# Patient Record
Sex: Female | Born: 1935 | Race: White | Hispanic: No | Smoking: Never smoker
Health system: Southern US, Community
[De-identification: ages and names within clinical notes are randomized; demographics above are authoritative.]

## PROBLEM LIST (undated history)

## (undated) DIAGNOSIS — I208 Other forms of angina pectoris: Secondary | ICD-10-CM

## (undated) DIAGNOSIS — R911 Solitary pulmonary nodule: Secondary | ICD-10-CM

## (undated) DIAGNOSIS — D696 Thrombocytopenia, unspecified: Secondary | ICD-10-CM

## (undated) DIAGNOSIS — D649 Anemia, unspecified: Secondary | ICD-10-CM

## (undated) DIAGNOSIS — F419 Anxiety disorder, unspecified: Secondary | ICD-10-CM

## (undated) DIAGNOSIS — K219 Gastro-esophageal reflux disease without esophagitis: Secondary | ICD-10-CM

## (undated) DIAGNOSIS — I2089 Other forms of angina pectoris: Secondary | ICD-10-CM

## (undated) DIAGNOSIS — I454 Nonspecific intraventricular block: Secondary | ICD-10-CM

## (undated) DIAGNOSIS — IMO0001 Reserved for inherently not codable concepts without codable children: Secondary | ICD-10-CM

## (undated) DIAGNOSIS — I2510 Atherosclerotic heart disease of native coronary artery without angina pectoris: Secondary | ICD-10-CM

## (undated) HISTORY — DX: Anxiety disorder, unspecified: F41.9

## (undated) HISTORY — DX: Other forms of angina pectoris: I20.8

## (undated) HISTORY — DX: Other forms of angina pectoris: I20.89

## (undated) HISTORY — DX: Anemia, unspecified: D64.9

## (undated) HISTORY — DX: Reserved for inherently not codable concepts without codable children: IMO0001

## (undated) HISTORY — DX: Thrombocytopenia, unspecified: D69.6

## (undated) HISTORY — DX: Gastro-esophageal reflux disease without esophagitis: K21.9

## (undated) HISTORY — DX: Nonspecific intraventricular block: I45.4

## (undated) HISTORY — DX: Solitary pulmonary nodule: R91.1

## (undated) HISTORY — DX: Atherosclerotic heart disease of native coronary artery without angina pectoris: I25.10

---

## 2001-05-26 ENCOUNTER — Encounter: Payer: Self-pay | Admitting: Emergency Medicine

## 2001-05-26 ENCOUNTER — Emergency Department (HOSPITAL_COMMUNITY): Admission: EM | Admit: 2001-05-26 | Discharge: 2001-05-26 | Payer: Self-pay | Admitting: Emergency Medicine

## 2001-07-24 ENCOUNTER — Encounter: Payer: Self-pay | Admitting: Internal Medicine

## 2001-07-24 ENCOUNTER — Encounter: Admission: RE | Admit: 2001-07-24 | Discharge: 2001-07-24 | Payer: Self-pay | Admitting: Internal Medicine

## 2002-07-11 ENCOUNTER — Inpatient Hospital Stay (HOSPITAL_COMMUNITY): Admission: EM | Admit: 2002-07-11 | Discharge: 2002-07-12 | Payer: Self-pay | Admitting: Cardiology

## 2004-10-27 ENCOUNTER — Ambulatory Visit: Payer: Self-pay | Admitting: Cardiology

## 2004-10-27 ENCOUNTER — Ambulatory Visit: Payer: Self-pay | Admitting: Cardiovascular Disease

## 2004-10-27 ENCOUNTER — Inpatient Hospital Stay (HOSPITAL_COMMUNITY): Admission: AD | Admit: 2004-10-27 | Discharge: 2004-10-30 | Payer: Self-pay | Admitting: Cardiovascular Disease

## 2004-11-04 ENCOUNTER — Ambulatory Visit: Payer: Self-pay | Admitting: Cardiology

## 2004-12-22 ENCOUNTER — Ambulatory Visit: Payer: Self-pay | Admitting: Cardiology

## 2005-12-07 ENCOUNTER — Ambulatory Visit: Payer: Self-pay | Admitting: Cardiology

## 2005-12-07 ENCOUNTER — Encounter: Payer: Self-pay | Admitting: Cardiology

## 2006-01-03 ENCOUNTER — Ambulatory Visit: Payer: Self-pay | Admitting: Cardiology

## 2006-02-14 ENCOUNTER — Ambulatory Visit: Payer: Self-pay | Admitting: Cardiology

## 2006-02-25 ENCOUNTER — Ambulatory Visit: Payer: Self-pay | Admitting: Cardiology

## 2006-03-03 ENCOUNTER — Inpatient Hospital Stay (HOSPITAL_BASED_OUTPATIENT_CLINIC_OR_DEPARTMENT_OTHER): Admission: RE | Admit: 2006-03-03 | Discharge: 2006-03-03 | Payer: Self-pay | Admitting: Cardiology

## 2006-03-03 ENCOUNTER — Ambulatory Visit: Payer: Self-pay | Admitting: Cardiology

## 2006-08-24 ENCOUNTER — Ambulatory Visit: Payer: Self-pay | Admitting: Cardiology

## 2007-01-27 ENCOUNTER — Ambulatory Visit: Payer: Self-pay | Admitting: Cardiology

## 2007-02-03 ENCOUNTER — Encounter: Admission: RE | Admit: 2007-02-03 | Discharge: 2007-02-03 | Payer: Self-pay | Admitting: General Surgery

## 2007-04-28 ENCOUNTER — Encounter: Payer: Self-pay | Admitting: Cardiology

## 2007-10-26 ENCOUNTER — Ambulatory Visit: Payer: Self-pay | Admitting: Cardiology

## 2008-02-04 ENCOUNTER — Encounter: Payer: Self-pay | Admitting: Cardiology

## 2008-05-31 ENCOUNTER — Ambulatory Visit: Payer: Self-pay | Admitting: Cardiology

## 2008-06-03 ENCOUNTER — Ambulatory Visit: Payer: Self-pay | Admitting: Cardiology

## 2008-08-08 ENCOUNTER — Ambulatory Visit: Payer: Self-pay | Admitting: Cardiology

## 2008-08-10 ENCOUNTER — Encounter: Payer: Self-pay | Admitting: Cardiology

## 2008-08-15 ENCOUNTER — Inpatient Hospital Stay (HOSPITAL_BASED_OUTPATIENT_CLINIC_OR_DEPARTMENT_OTHER): Admission: RE | Admit: 2008-08-15 | Discharge: 2008-08-15 | Payer: Self-pay | Admitting: Cardiology

## 2008-08-15 ENCOUNTER — Ambulatory Visit: Payer: Self-pay | Admitting: Cardiology

## 2008-10-18 ENCOUNTER — Encounter: Payer: Self-pay | Admitting: Cardiology

## 2008-10-29 ENCOUNTER — Inpatient Hospital Stay (HOSPITAL_COMMUNITY): Admission: RE | Admit: 2008-10-29 | Discharge: 2008-11-10 | Payer: Self-pay | Admitting: Cardiology

## 2008-10-29 ENCOUNTER — Ambulatory Visit: Payer: Self-pay | Admitting: Thoracic Surgery (Cardiothoracic Vascular Surgery)

## 2008-10-29 ENCOUNTER — Ambulatory Visit: Payer: Self-pay | Admitting: Cardiology

## 2008-10-29 ENCOUNTER — Encounter: Payer: Self-pay | Admitting: Cardiology

## 2008-10-29 DIAGNOSIS — J984 Other disorders of lung: Secondary | ICD-10-CM | POA: Insufficient documentation

## 2008-10-29 DIAGNOSIS — I2510 Atherosclerotic heart disease of native coronary artery without angina pectoris: Secondary | ICD-10-CM | POA: Insufficient documentation

## 2008-10-29 DIAGNOSIS — R079 Chest pain, unspecified: Secondary | ICD-10-CM | POA: Insufficient documentation

## 2008-10-29 DIAGNOSIS — D696 Thrombocytopenia, unspecified: Secondary | ICD-10-CM

## 2008-10-29 DIAGNOSIS — I447 Left bundle-branch block, unspecified: Secondary | ICD-10-CM

## 2008-10-29 DIAGNOSIS — D649 Anemia, unspecified: Secondary | ICD-10-CM

## 2008-10-30 ENCOUNTER — Encounter: Payer: Self-pay | Admitting: Cardiothoracic Surgery

## 2008-11-04 HISTORY — PX: CORONARY ARTERY BYPASS GRAFT: SHX141

## 2008-12-02 ENCOUNTER — Ambulatory Visit: Payer: Self-pay | Admitting: Cardiothoracic Surgery

## 2008-12-02 ENCOUNTER — Encounter: Admission: RE | Admit: 2008-12-02 | Discharge: 2008-12-02 | Payer: Self-pay | Admitting: Cardiothoracic Surgery

## 2008-12-24 ENCOUNTER — Ambulatory Visit: Payer: Self-pay | Admitting: Cardiology

## 2009-07-10 ENCOUNTER — Ambulatory Visit: Payer: Self-pay | Admitting: Cardiology

## 2009-07-14 ENCOUNTER — Encounter: Payer: Self-pay | Admitting: Cardiology

## 2009-07-15 ENCOUNTER — Encounter: Payer: Self-pay | Admitting: Cardiology

## 2009-07-25 ENCOUNTER — Ambulatory Visit: Payer: Self-pay | Admitting: Cardiology

## 2009-07-25 ENCOUNTER — Encounter: Payer: Self-pay | Admitting: Cardiology

## 2009-10-25 ENCOUNTER — Encounter: Payer: Self-pay | Admitting: Cardiology

## 2009-10-27 ENCOUNTER — Encounter: Payer: Self-pay | Admitting: Cardiology

## 2009-10-29 ENCOUNTER — Ambulatory Visit: Payer: Self-pay | Admitting: Cardiology

## 2009-10-29 DIAGNOSIS — R609 Edema, unspecified: Secondary | ICD-10-CM

## 2009-10-29 DIAGNOSIS — I10 Essential (primary) hypertension: Secondary | ICD-10-CM | POA: Insufficient documentation

## 2009-12-15 ENCOUNTER — Encounter: Payer: Self-pay | Admitting: Cardiology

## 2009-12-26 ENCOUNTER — Encounter: Payer: Self-pay | Admitting: Cardiology

## 2010-01-21 ENCOUNTER — Ambulatory Visit: Payer: Self-pay | Admitting: Cardiology

## 2010-01-21 ENCOUNTER — Encounter: Payer: Self-pay | Admitting: Cardiology

## 2010-01-30 ENCOUNTER — Encounter (INDEPENDENT_AMBULATORY_CARE_PROVIDER_SITE_OTHER): Payer: Self-pay | Admitting: *Deleted

## 2010-02-24 ENCOUNTER — Ambulatory Visit: Payer: Self-pay | Admitting: Cardiology

## 2010-11-03 ENCOUNTER — Encounter: Payer: Self-pay | Admitting: Cardiology

## 2010-12-27 ENCOUNTER — Encounter: Payer: Self-pay | Admitting: Internal Medicine

## 2011-01-01 ENCOUNTER — Ambulatory Visit: Admit: 2011-01-01 | Payer: Self-pay | Admitting: Cardiology

## 2011-01-05 NOTE — Letter (Signed)
Summary: External Correspondence/ OFFICE VISIT [facility]  External Correspondence/ OFFICE VISIT [facility]   Imported By: Dorise Hiss 11/12/2010 09:33:43  _____________________________________________________________________  External Attachment:    Type:   Image     Comment:   External Document

## 2011-01-05 NOTE — Medication Information (Signed)
Summary: RX Folder/ MEDICATIONS [facility]  RX Folder/ MEDICATIONS [facility]   Imported By: Dorise Hiss 11/12/2010 09:55:45  _____________________________________________________________________  External Attachment:    Type:   Image     Comment:   External Document

## 2011-01-05 NOTE — Assessment & Plan Note (Signed)
Summary: 3 mo fu r/s from no show   Visit Type:  Follow-up Primary Provider:  Dr Sherril Croon  CC:  follow-up visit.  History of Present Illness: the patient is a 77 year old female with a history of single-vessel coronary artery disease. The patient status post left internal mammary artery bypass graft to the LAD. She has no significant circumflex or right coronary disease. Last office visit the patient had some chest pain. We increased him to her overall improvement in her symptoms and no recurrence of chest pain. She also had evidence of torsion with the edema and exertional dyspnea. A followup echocardiogram however showed normal LV function. The patient has been placed on low-dosehydrochlorothiazide on a daily basis and is much improved.she denies any palpitations orthopnea or PND.  Preventive Screening-Counseling & Management  Alcohol-Tobacco     Smoking Status: never  Current Problems (verified): 1)  Essential Hypertension, Benign  (ICD-401.1) 2)  Edema Leg  (ICD-782.3) 3)  Thrombocytopenia, Chronic  (ICD-287.5) 4)  Anemia, Normocytic  (ICD-285.9) 5)  Lung Nodule  (ICD-518.89) 6)  Lbbb  (ICD-426.3) 7)  Chest Pain, Exertional  (ICD-786.50) 8)  Coronary Atherosclerosis Native Coronary Artery  (ICD-414.01)  Current Medications (verified): 1)  Isosorbide Mononitrate Cr 60 Mg Xr24h-Tab (Isosorbide Mononitrate) .... Take 1 1/2 Tabs (90mg ) Daily Place On File 2)  Metoprolol Tartrate 50 Mg Tabs (Metoprolol Tartrate) .... Take 1 Tablet By Mouth Two Times A Day 3)  Aspirin 81 Mg Tbec (Aspirin) .... Take One Tablet By Mouth Daily 4)  Alprazolam 0.25 Mg Tabs (Alprazolam) .... Take 1 Tablet By Mouth At Bedtime As Needed 5)  Nexium 40 Mg Cpdr (Esomeprazole Magnesium) .... Take 1 Capsule By Mouth Once A Day 6)  Simvastatin 40 Mg Tabs (Simvastatin) .... Take 1 Tab By Mouth At Bedtime 7)  Nitroglycerin 0.4 Mg Subl (Nitroglycerin) .... Dissolve One Tablet Under Tongue For Severe Chest Pain As Needed  Every 5 Minutes, Not To Exceed 3 in 15 Min Time Frame 8)  Multivitamins   Tabs (Multiple Vitamin) .... Take 1 Tablet By Mouth Once A Day 9)  Calcium Carbonate-Vitamin D 600-400 Mg-Unit  Tabs (Calcium Carbonate-Vitamin D) .... Take 1 Tablet By Mouth Once A Day 10)  Hydrochlorothiazide 12.5 Mg Tabs (Hydrochlorothiazide) .... Take One Tablet By Mouth Daily.  Allergies (verified): No Known Drug Allergies  Comments:  Nurse/Medical Assistant: The patient's medications and allergies were reviewed with the patient and were updated in the Medication and Allergy Lists. Verally gave names of meds.  Past History:  Past Medical History: Last updated: 10/29/2008 exertional angina single vessel coronary artery disease of the LAD normal LV function the bundle-branch block stable left lower lobe nodule status post CT scan mild normocytic anemia mild thrombocytopenia, platelet count 80,000 anxiety disorder gastroesophageal reflux disease  Family History: Last updated: 10/28/2009 noncontributory  Social History: Last updated: 10/28/2009 the patient is well smoke or drink  Risk Factors: Smoking Status: never (02/24/2010)  Past Surgical History: status post coronary bypass grafting  Review of Systems  The patient denies fatigue, malaise, fever, weight gain/loss, vision loss, decreased hearing, hoarseness, chest pain, palpitations, shortness of breath, prolonged cough, wheezing, sleep apnea, coughing up blood, abdominal pain, blood in stool, nausea, vomiting, diarrhea, heartburn, incontinence, blood in urine, muscle weakness, joint pain, leg swelling, rash, skin lesions, headache, fainting, dizziness, depression, anxiety, enlarged lymph nodes, easy bruising or bleeding, and environmental allergies.    Vital Signs:  Patient profile:   77 year old female Height:  64 inches Weight:      127 pounds Pulse rate:   65 / minute BP sitting:   97 / 59  (left arm) Cuff size:    regular  Vitals Entered By: Carlye Grippe (February 24, 2010 11:03 AM) CC: follow-up visit   Physical Exam  Additional Exam:  General: Well-developed, well-nourished in no distress head: Normocephalic and atraumatic eyes PERRLA/EOMI intact, conjunctiva and lids normal nose: No deformity or lesions mouth normal dentition, normal posterior pharynx neck: Supple, no JVD.  No masses, thyromegaly or abnormal cervical nodes lungs: Normal breath sounds bilaterally without wheezing.  Normal percussion heart: regular rate and rhythm with normal S1 and S2, no S3 or S4.  PMI is normal.  No pathological murmurs abdomen: Normal bowel sounds, abdomen is soft and nontender without masses, organomegaly or hernias noted.  No hepatosplenomegaly musculoskeletal: Back normal, normal gait muscle strength and tone normal pulsus: Pulse is normal in all 4 extremities Extremities: No peripheral pitting edema neurologic: Alert and oriented x 3 skin: Intact without lesions or rashes cervical nodes: No significant adenopathy psychologic: Normal affect    Impression & Recommendations:  Problem # 1:  ESSENTIAL HYPERTENSION, BENIGN (ICD-401.1) controlled Her updated medication list for this problem includes:    Metoprolol Tartrate 50 Mg Tabs (Metoprolol tartrate) .Marland Kitchen... Take 1 tablet by mouth two times a day    Aspirin 81 Mg Tbec (Aspirin) .Marland Kitchen... Take one tablet by mouth daily    Hydrochlorothiazide 12.5 Mg Tabs (Hydrochlorothiazide) .Marland Kitchen... Take one tablet by mouth daily.  Problem # 2:  CHEST PAIN, EXERTIONAL (ICD-786.50) no recurrence of exertional chest pain. Continue current dose of isosorbide mononitrate. Her updated medication list for this problem includes:    Isosorbide Mononitrate Cr 60 Mg Xr24h-tab (Isosorbide mononitrate) .Marland Kitchen... Take 1 1/2 tabs (90mg ) daily place on file    Metoprolol Tartrate 50 Mg Tabs (Metoprolol tartrate) .Marland Kitchen... Take 1 tablet by mouth two times a day    Aspirin 81 Mg Tbec (Aspirin)  .Marland Kitchen... Take one tablet by mouth daily    Nitroglycerin 0.4 Mg Subl (Nitroglycerin) .Marland Kitchen... Dissolve one tablet under tongue for severe chest pain as needed every 5 minutes, not to exceed 3 in 15 min time frame  Problem # 3:  CORONARY ATHEROSCLEROSIS NATIVE CORONARY ARTERY (ICD-414.01) the patient status post coronary bypass grafting. Her updated medication list for this problem includes:    Isosorbide Mononitrate Cr 60 Mg Xr24h-tab (Isosorbide mononitrate) .Marland Kitchen... Take 1 1/2 tabs (90mg ) daily place on file    Metoprolol Tartrate 50 Mg Tabs (Metoprolol tartrate) .Marland Kitchen... Take 1 tablet by mouth two times a day    Aspirin 81 Mg Tbec (Aspirin) .Marland Kitchen... Take one tablet by mouth daily    Nitroglycerin 0.4 Mg Subl (Nitroglycerin) .Marland Kitchen... Dissolve one tablet under tongue for severe chest pain as needed every 5 minutes, not to exceed 3 in 15 min time frame  Problem # 4:  EDEMA LEG (ICD-782.3) results with hydrochlorothiazide. Echocardiogram is essentially normal limits with no evidence of systolic heart failure.  Patient Instructions: 1)  Your physician recommends that you continue on your current medications as directed. Please refer to the Current Medication list given to you today. 2)  Follow up in  1 year.

## 2011-01-05 NOTE — Miscellaneous (Signed)
Summary: Orders Update  Clinical Lists Changes  Orders: Added new Referral order of 2-D Echocardiogram (2D Echo) - Signed 

## 2011-01-05 NOTE — Letter (Signed)
Summary: Appointment -missed  [facility] at [facility] [address]   [place], [place] [address]   Phone: [phone]  Fax: [phone]     January 21, 2010 MRN: [id number]      [facility] [address] [place], [place]  [address]      Dear Ms. Neale Burly,  Our records indicate you missed your appointment on January 21, 2010                        with Dr. Andee Lineman.   It is very important that we reach you to reschedule this appointment. We look forward to participating in your health care needs.   Please contact us at the number listed above at your earliest convenience to reschedule this appointment.   Sincerely,    Glass blower/designer

## 2011-01-05 NOTE — Letter (Signed)
Summary: Engineer, materials at [facility]  [address]   [place], [place] [address]   Phone: [phone]  Fax: [phone]        January 30, 2010 MRN: [id number]   [facility] [address] [place], [place]  [address]   Dear Ms. Cheaney,  Your test ordered by Selena Batten has been reviewed by your physician (or physician assistant) and was found to be normal or stable. Your physician (or physician assistant) felt no changes were needed at this time.  __X__ Echocardiogram  ____ Cardiac Stress Test  ____ Lab Work  ____ Peripheral vascular study of arms, legs or neck  ____ CT scan or X-ray  ____ Lung or Breathing test  ____ Other:   Thank you.   Hoover Brunette, LPN    Duane Boston, M.D., F.A.C.C. Thressa Sheller, M.D., F.A.C.C. Oneal Grout, M.D., F.A.C.C. Cheree Ditto, M.D., F.A.C.C. Daiva Nakayama, M.D., F.A.C.C. Kenney Houseman, M.D., F.A.C.C. Jeanne Ivan, PA-C

## 2011-02-05 ENCOUNTER — Ambulatory Visit: Payer: Self-pay | Admitting: Cardiology

## 2011-03-19 ENCOUNTER — Ambulatory Visit: Payer: Self-pay | Admitting: Cardiology

## 2011-04-20 ENCOUNTER — Encounter: Payer: Self-pay | Admitting: Cardiology

## 2011-04-20 NOTE — H&P (Signed)
NAMEGUNDA, MAQUEDA NO.:  [id number]   MEDICAL RECORD NO.:  [id number]          PATIENT TYPE:  INP   LOCATION:  2920                         FACILITY:  [facility]   PHYSICIAN:  Arturo Morton. Riley Kill, MD, FACCDATE OF BIRTH:  1933/12/20   DATE OF ADMISSION:  10/29/2008  DATE OF DISCHARGE:                              HISTORY & PHYSICAL   PRIMARY CARE PHYSICIAN:  Doreen Beam, MD, in [place], [place].   PRIMARY CARDIOLOGIST:  [name], MD,FACC   CHIEF COMPLAINT:  Chest pain.   HISTORY OF PRESENT ILLNESS:  Ms. Molden is a 77 year old female with  known coronary artery disease.  She had a heart catheterization on  August 15, 2008, that showed an 80% diagonal stenosis.  This was a  small vessel and was treated medically.   Recently, Ms. Shrestha has noted an increase in the frequency and  intensity of her chest pain.  She also has noted chest pain that started  at rest which is a new symptom for her.  It was felt by Dr. Andee Lineman that  she needed repeat catheterization and possible percutaneous  intervention, and she is here today for the procedure.  Currently, she  is at rest and asymptomatic.   PAST MEDICAL HISTORY:  1. Status post cardiac catheterization on August 15, 2008, with LAD      50% proximal and diagonal 80% stenosis, no critical disease in the      circumflex and RCA, EF 60%.  2. History of left bundle-branch block.  3. Family history of coronary artery disease.  4. History of left lower lobe lung nodule, stable by CT.  5. History of anemia, hemoglobin 13.6 today.  6. History of thrombocytopenia with a platelet count of 137,000 today.  7. History of palpitations.  8. Hyperlipidemia.  9. Gastroesophageal reflux disease.  10.History of anxiety/panic disorder.   SURGICAL HISTORY:  She is status post cardiac catheterization.   ALLERGIES:  No known drug allergies.   CURRENT MEDICATIONS:  1. Imdur 120 mg daily.  2. Metoprolol ER 25 mg a day.  3. Aspirin 325 mg daily.  4. Simvastatin 40 mg daily.  5. Nitroglycerin sublingual p.r.n.   SOCIAL HISTORY:  She lives in [place] with her husband and works part-time.  She denies alcohol, tobacco, or drug abuse.  She does not exercise  regularly.   FAMILY HISTORY:  Her mother died at age 49 with a history of heart  disease and her father died at age 73 also with a history of heart  disease.  One of her sisters has coronary artery disease as well.   REVIEW OF SYSTEMS:  She has had chest pain that is associated with some  shortness of breath.  It is not pleuritic.  She does not routinely cough  or wheeze.  She has some chronic arthralgias.  She has some problems  with depression and anxiety at times.  Her reflux symptoms are fairly  well controlled.  There has been no melena.  Full 14-point review of  systems is otherwise negative.   PHYSICAL EXAMINATION:  VITAL SIGNS:  Temperature is  97.5, blood pressure  141/81, pulse 92, respiratory rate 18, and O2 saturation 95% on room  air.  GENERAL:  She is a slender, well-developed, white female in no acute  distress at rest.  HEENT:  Normal.  NECK:  There is no lymphadenopathy, thyromegaly, bruit, or JVD noted.  CARDIOVASCULAR:  Heart is regular in rate and rhythm with an S1 and S2.  No significant murmur, rub, or gallop is noted.  Distal pulses are  intact in all 4 extremities and no femoral bruits are appreciated.  LUNGS:  Essentially clear to auscultation bilaterally.  SKIN:  No rashes or lesions are noted.  ABDOMEN:  Soft and nontender with active bowel sounds.  EXTREMITIES:  There is no cyanosis, clubbing, or edema noted.  MUSCULOSKELETAL:  There is no joint deformity or effusions and no spine  or CVA tenderness is noted.  NEUROLOGIC:  She is alert and oriented with cranial nerves II-XII  grossly intact.   LABORATORY VALUES:  Hemoglobin 13.6, hematocrit 39.2, WBCs 6.3,  platelets 137, INR 1.0.  Sodium 139, potassium 3.6, chloride 109,  CO2  21, BUN 14, creatinine 0.77, glucose 105, GFR rate in the 60.   IMPRESSION:  Chest pain:  Ms. Mastandrea describes progressive exertional  symptoms.  She is here today for catheterization with further evaluation  and treatment depending on the results.  She will be continued on all of  her home medications.      Theodore Demark, PA-C      Arturo Morton. Riley Kill, MD, [facility]  Electronically Signed    RB/MEDQ  D:  10/29/2008  T:  10/30/2008  Job:  [id number]

## 2011-04-20 NOTE — Op Note (Signed)
NAMEMarland Kitchen  [name] Graham, [name] Graham NO.:  [id number]   MEDICAL RECORD NO.:  [id number]          PATIENT TYPE:  INP   LOCATION:  2309                         FACILITY:  [facility]   PHYSICIAN:  Kerin Perna, M.D.  DATE OF BIRTH:  1933/12/23   DATE OF PROCEDURE:  11/04/2008  DATE OF DISCHARGE:                               OPERATIVE REPORT   OPERATION:  1. Coronary artery bypass grafting x2 (left internal mammary artery to      left anterior descending, saphenous vein graft to diagonal).  2. Endoscopic vein harvest of the right leg greater saphenous vein.   PREOPERATIVE DIAGNOSIS:  Class IV unstable angina with severe 2-vessel  coronary artery disease.   POSTOPERATIVE DIAGNOSIS:  Class IV unstable angina with severe 2-vessel  coronary artery disease.   SURGEON:  Kerin Perna, MD   ASSISTANTS:  1. Evelene Croon, MD  2. Doree Fudge, PA-C   ANESTHESIA:  General.   INDICATIONS:  The patient is a 77 year old female who was admitted with  unstable angina, rule out MI.  Cardiac catheterization showed a  significant proximal LAD stenosis of 80-90% with a proximal diagonal  stenosis of 80%.  She was not felt to be candidate for percutaneous  intervention due to the anatomy of her LAD disease and was felt to be  candidate for surgical revascularization.  Because she was given a  Plavix load before cath, we waited 5 days for Plavix washout before  doing surgical coronary revascularization.  Prior to surgery, I examined  the patient in her CCU room and reviewed results of the cardiac cath  with the patient and family.  I discussed the indications and expected  benefits of coronary artery bypass surgery for treatment of her severe  coronary artery disease as well as alternatives.  I discussed the risks  including the risks of MI, bleeding, infection, stroke, and death.  After reviewing these issues, she demonstrated a thorough understanding  and agreed to proceed with the  surgery under what I felt was an informed  consent.   OPERATIVE FINDINGS:  1. Small but patent left IMA conduit.  2. Adequate targets for grafting.  3. Endoscopically harvested saphenous vein from the right leg which      was an adequate conduit.  4. Intraoperative anemia requiring 2 units of packed cell transfusion.   PROCEDURE:  The patient was brought to the operating room and placed  supine on the operating table.  General anesthesia was induced.  The  chest, abdomen, and legs were prepped with Betadine and draped as a  sterile field.  A sternal incision was made.  The saphenous vein was  harvested endoscopically from the right leg.  The left internal mammary  artery was harvested as a pedicle graft from its origin at the  subclavian vessels.  Heparin was administered, and a sternal retractor  was placed.  The pericardium was opened and suspended.  Pursestrings  were placed in the ascending aorta and right atrium.  When the vein had  been harvested and inspected and found to be adequate, the patient was  cannulated and  placed on bypass.  The coronaries were identified for  grafting.  The LAD was a good target, but the diagonal was smaller that  had high-grade proximal stenosis and was 1.2 mm and was a graftable  vessel.  The vein and mammary artery were prepared for the distal  anastomoses and cardioplegic catheters were placed for both antegrade  and retrograde cold blood cardioplegia.  The aortic cross-clamp was  applied and 800 mL of cold blood cardioplegia was delivered.  There was  a good cardioplegic arrest and septal temperature dropped less than 15  degrees.   The distal coronary anastomoses were then performed.  The first distal  anastomosis was to the mid LAD.  It was a 1.7-mm vessel with a proximal  90% stenosis.  The left IMA pedicle was brought through an opening  created in the left lateral pericardium.  It was brought down on the LAD  and sewn end-to-side with a  running 8-0 Prolene.  There was good flow  through the anastomosis after briefly releasing pedicle bulldog on the  mammary artery.  The pedicle was secured at the epicardium and the  bulldog was reapplied.  The second distal anastomosis was the diagonal  branch of LAD.  This was a smaller 1.2-mm vessel with proximal 80%  stenosis.  Reverse saphenous vein was sewn end-to-side with a running 7-  0 Prolene with good flow through the graft.  Cardioplegia was redosed.   While the cross-clamp was still in place, the proximal vein anastomosis  was performed on the ascending aorta using a 4.0-mm punch running 7-0  Prolene.  Prior to tying down the proximal anastomosis, air was vented  from the coronaries with a dose of retrograde warm blood cardioplegia.  The final proximal anastomosis was tied and the cross-clamp was removed.   The heart resumed a spontaneous rhythm.  Air was aspirated from the vein  graft.  The mammary artery and vein had good flow and hemostasis was  documented at the proximal distal anastomoses.  The patient was rewarmed  to 37 degrees.  Temporary pacing wires were applied.  The cardioplegic  catheters had been removed.  The lungs re-expanded and the ventilator  was resumed.  The patient was weaned from bypass without difficulty.  Cardiac output and blood pressure were stable.  Protamine was  administered without adverse reaction.  The cannulas were removed and  mediastinum was irrigated with warm saline.  The patient had persistent  coagulopathy after reversal of heparin and platelets were given due to  her preoperative history of Plavix use.  The superior pericardial fat  was closed over the aorta.  Two mediastinal and a left pleural chest  tube were placed and brought out through separate incisions.  The  sternum was closed with interrupted steel wire.  The pectoralis fascia  was closed with a running #1 Vicryl.  The subcutaneous and skin layers  were closed using running  Vicryl and sterile dressings were applied.  Total bypass time was 70 minutes.      Kerin Perna, M.D.  Electronically Signed     PV/MEDQ  D:  11/04/2008  T:  11/05/2008  Job:  [id number]

## 2011-04-20 NOTE — Discharge Summary (Signed)
NAMEMarland Kitchen  [name], Graham NO.:  [id number]   MEDICAL RECORD NO.:  [id number]          PATIENT TYPE:  INP   LOCATION:  2004                         FACILITY:  [facility]   PHYSICIAN:  Kerin Perna, M.D.  DATE OF BIRTH:  09/28/1934   DATE OF ADMISSION:  10/29/2008  DATE OF DISCHARGE:  11/10/2008                               DISCHARGE SUMMARY   HISTORY:  The patient is a 77 year old female with known coronary artery  disease having undergone a heart catheterization in August 15, 2008,  which revealed an 80% diagonal stenosis.  This was a small vessel and  was treated medically.  More recently, she has noted increased frequency  and intensity of her chest pain.  She also noted resting chest pain  symptoms.  She was felt by Dr. Andee Lineman to require a repeat  catheterization and possible further intervention and she was admitted  this hospitalization for the procedure.   PAST MEDICAL HISTORY:  Catheterization in August 15, 2009, with LAD  of 50% proximal, diagonal 80% stenosis, no critical disease in the  circumflex and right coronary artery and ejection fraction was 60%.  Other diagnoses include history of left bundle-branch block; family  history of coronary artery disease; history of left lower lobe lung  nodule, stable by CT scan; history of anemia; history of  thrombocytopenia; history of palpitations; history of hyperlipidemia;  history of gastroesophageal reflux disease; history of anxiety disorder;  and history of panic disorder.   PAST SURGICAL HISTORY:  Cardiac catheterization.   ALLERGIES:  No known drug allergies.   MEDICATIONS:  Prior to admission:  1. Imdur 120 mg daily.  2. Metoprolol ER 25 mg daily.  3. Aspirin 325 mg daily.  4. Simvastatin 40 mg daily.  5. Nitroglycerin p.r.n. sublingual.   Family history, social history, review of symptoms, and physical exam,  please see the history and physical done at the time of admission.   HOSPITAL  COURSE:  The patient was admitted electively and taken to the  cardiac catheterization lab on October 29, 2008.  Findings included  normal left ventricular function.  A 20-30% ostial obtuse marginal #1  lesion was noted.  The LAD had a 50-70% proximal lesion and an 80% mid  vessel stenosis. The right coronary artery showed good flow and was  unchanged.  Due to these findings, surgical consultation was obtained  with Tressie Stalker, MD, who evaluated the patient and studies.  The  patient was initially somewhat hesitant, but eventually did agree  proceed with surgery.  The procedure was scheduled and undertaken by Dr.  Donata Clay on November 04, 2008.   PROCEDURE:  On November 04, 2008, the patient underwent coronary artery  bypass grafting x2.  The following grafts were placed:  1. Left internal mammary artery to the LAD.  2. A saphenous vein graft to the diagonal.  The patient tolerated the procedure well and was taken the Surgical  Intensive Care Unit in stable condition.   POSTOPERATIVE HOSPITAL COURSE:  The patient has progressed nicely.  She  has maintained stable hemodynamics.  She initially did require some  low-  dose inotropic support which was weaned without difficulty.  She has  remained neurologically intact.  She has remained hemodynamically  stable.  All routine lines, monitors, and drainage devices were  discontinued in the standard fashion.  The patient did have a moderate  postoperative acute blood loss anemia.  Her values did stabilize.  Most  recent hemoglobin and hematocrit dated November 08, 2008, were 9.6 and  27.7 respectively.  Her thrombocytopenia was exacerbated slightly  postoperatively.  These values did improve with time.  Her incisions  were healing well without evidence of infection.  She tolerated a  gradual increase in activity using standard protocols.  Her oxygen was  weaned, and she maintained good saturations on room air.  Her overall  status was felt  to be acceptable for discharge on November 10, 2008.   CONDITION ON DISCHARGE:  Stable, improving.   MEDICATIONS ON DISCHARGE:  1. Aspirin 325 mg daily.  2. Simvastatin 40 mg daily.  3. Nexium 40 mg daily.  4. Lopressor 50 mg twice daily.  5. Nu-Iron 150 mg daily.  6. Lasix 40 mg daily for 5 days.  7. Potassium chloride 20 mEq daily for 5 days.  8. For pain, Ultram 50 mg 1-2 every 4-6 hours as needed.   INSTRUCTIONS:  The patient received written instructions regarding  medications, activity, diet, wound care, and followup.  Follow up  included an appointment to see Dr. Andee Lineman 2 weeks post discharge and Dr.  Donata Clay on December 02, 2008, at 1:15 p.m.   FINAL DIAGNOSIS:  Severe coronary artery disease as described, now  status post surgical revascularization.   OTHER DIAGNOSES:  1. Postoperative anemia.  2. History of thrombocytopenia.  3. History of left bundle-branch block.  4. History of hyperlipidemia.  5. History of gastroesophageal reflux.      Rowe Clack, P.A.-C.      Kerin Perna, M.D.  Electronically Signed    WEG/MEDQ  D:  12/17/2008  T:  12/18/2008  Job:  [id number]   cc:   Arturo Morton. Riley Kill, MD, [facility]  Kerin Perna, M.D.  Doreen Beam, MD  [name], MD,FACC

## 2011-04-20 NOTE — Assessment & Plan Note (Signed)
[facility]                          [facility] OFFICE NOTE   NAME:Graham Graham                      MRN:          [id number]  DATE:06/03/2008                            DOB:          1934-06-17    REFERRING PHYSICIAN:  Doreen Beam, MD   HISTORY OF PRESENT ILLNESS:  The patient is a 77 year old female  recently admitted on May 31, 2008, to [facility] with  progressive exertional angina.  The patient ruled out for myocardial  infarction.  She has known coronary artery disease with a 50% mid LAD  lesion.  She had no acute EKG changes.  It was felt the patient was  relatively low risk.  Her medical therapy was increased.  She was also  apprehensive about proceeding with an early cardiac catheterization.  We  made arrangements for the patient to follow up with our office in a  couple of days later to make sure that her symptomatology improved on  the increased medical therapy.  Today, she tells me that she has  improved over the weekend.  She has had less angina.  She still has  exertional angina but on moderate exertion and only had a single  episode.  She feels overall that the frequency clearly has decreased of  her angina.  She does not appear to be unstable.  The patient was to go  later this week to the beach and does not want to proceed with an early  cardiac catheterization.  I discussed the risks associated with this  strategy and the patient understands, is also very apprehensive to  proceed with a cardiac catheterization due to issues related to her  prior cath; she stated that she was in a lot of pain and felt she did  not get adequate sedation and analgesia.  The patient is somewhat  anxious in the office today and is very concerned about the problems  that she had with her prior cardiac catheterizations.   MEDICATIONS:  1. Simvastatin 40 mg p.o. daily, which needs to be refilled.  2. Nexium 40 mg p.o. daily.  3. Imdur 90  mg p.o. daily.  4. Aspirin 325 mg p.o. daily.   PHYSICAL EXAMINATION:  VITAL SIGNS:  Blood pressure is 141/85, heart  rate is 101, weight is 122 pounds.  NECK:  Normal carotid upstroke, no carotid bruits.  LUNGS:  Clear breath sounds bilaterally.  HEART:  Regular rate and rhythm with normal S1 and S2.  No murmur, rubs,  or gallops.  ABDOMEN:  Soft and nontender.  No rebound.  No guarding.  Good bowel  sounds.  EXTREMITIES:  No cyanosis, clubbing, or edema.  NEURO:  The patient is alert, oriented, and grossly nonfocal.   PROBLEM LIST:  1. Exertional angina.  2. Nonobstructive coronary artery disease with a mid left anterior      descending lesion, 50% in March 2007.  3. Normal left ventricular function.  4. Left bundle-branch block.  5. Family history of coronary artery disease.  6. Stable left lower lobe nodule, status post CT scan during this  recent hospitalization.  7. Mild normocytic anemia.  8. Mild thrombocytopenia (platelet count 80,000).   PLAN:  1. The patient is very apprehensive to proceed with a cardiac      catheterization.  Her symptoms have improved.  I do think a      strategy with increased medical therapy, i.e., increasing Imdur and      adding a low-dose beta blocker is appropriate.  2. The patient has been notified that if she develops worsening      symptoms with accelerated symptomatology she will need to call me      as soon as possible, and she will need to proceed with an early      cardiac catheterization.  3. I have given the patient my cell phone number to call me in case if      she has any further questions.  I also reassured her that if we      need to proceed with a cardiac catheterization she will be given      adequate sedation and analgesia.     [name], MD,FACC  Electronically Signed    GED/MedQ  DD: 06/03/2008  DT: 06/04/2008  Job #: [id number]   cc:   Doreen Beam, MD

## 2011-04-20 NOTE — Cardiovascular Report (Signed)
NAMEMarland Graham  CATHYE, KREITER             ACCOUNT NO.:  [id number]   MEDICAL RECORD NO.:  [id number]           PATIENT TYPE:   LOCATION:                                 FACILITY:   PHYSICIAN:  Arturo Morton. Riley Kill, MD, FACCDATE OF BIRTH:  July 03, 1934   DATE OF PROCEDURE:  DATE OF DISCHARGE:                            CARDIAC CATHETERIZATION   INDICATIONS:  Ms. [name] Graham is a very delightful lady who underwent  outpatient diagnostic catheterization.  This demonstrated fairly  segmental plaquing in the left anterior descending artery followed by a  somewhat focal lesion in the mid LAD.  We discussed various strategies.  At that point, she had class I-II symptoms.  She was seen back in the  office, and the discussion regarding the COURAGE trial and potential  options were reviewed.  We leaned towards percutaneous intervention at  that point, but the patient elected for more conservative approach  initially.  In the past couple of days, the patient has developed class  III-IV symptoms now with significant pain at rest which is new.  The  initial strategy had been to segmentally stent the midvessel.  She was  brought in today for repeat evaluation after seeing Dr. Andee Lineman.   PROCEDURES:  1. Left heart catheterization.  2. Selective coronary arteriography.  3. Selective left ventriculography.  4. Subclavian angiography.   DESCRIPTION OF PROCEDURE:  The patient was brought to the  catheterization laboratory with an anticipated plan for percutaneous  intervention of the midvessel.  After taking the initial shots, the  progression from class I-II to III- IV was thought to be due to  deterioration in her more proximal disease.  There was more segmental  diffuse narrowing of the vessel all the way up to the ostium of the LAD.  As a result, I had the surgeons come over the catheterization  laboratory.  We discussed whether in fact the patient would not be  better served by an off-pump internal mammary to  the LAD.  Specifically,  treatment would require overlapping long smaller caliber drug-eluting  stents from the ostium all the way down the mid LAD.  After discussion  with the patient and Dr. Barry Dienes in the catheterization laboratory elected  to recommend a off-pump internal mammary to the LAD has opposed to long  small overlapping stents in the proximal and mid LAD.  This was  carefully discussed with the patient and she was taken to the holding  area in satisfactory clinical condition.   HEMODYNAMIC DATA:  1. Initial central aortic pressure was 130/75, mean 100.  2. Left ventricular pressure was 117/20.  3. There was no significant gradient on pullback across the aortic      valve.   ANGIOGRAPHIC DATA:  1. The left main is free of critical disease.  2. The left anterior descending artery is segmentally diseased all the      way down to the midportion.  Specifically, previously there was      plaque in the proximal LAD with a modest mid lesion followed by a      segmental area of disease in the  mid LAD of about 70-80%.  The LAD      proximally now looks more extensively diseased.  The distal LAD is      a very large-caliber vessel, does not appear to be calcified, and      does appear to be optimal for implantation of a mammary artery.  3. The circumflex provides a first marginal branch which is very large      goes out over the anterolateral apex.  This marginal branch      probably has about 20-30% proximal narrowing.  The remainder of the      AV circumflex is without critical narrowing with minimal      irregularity in the second marginal proximal vessel.  4. The right coronary artery demonstrates, is a widely patent vessel.      The PDA is a fairly large-caliber vessel without critical disease.      The PDA is somewhat smaller.  There is a little bit of ostial spasm      noted.  5. The subclavian appears to be widely patent as is the internal      mammary.  6. Ventriculography  in the RAO projection reveals preserved global      systolic function.  There may be just minimal anterolateral      hypokinesis.   CONCLUSIONS:  1. Minimal anterolateral hypokinesis with overall preserved left      ventricular function.  2. Segmental plaquing in the left anterior descending artery that      appears to have progressed from the initial diagnostic study, and      appears to be compatible with the patient's progression of      symptoms.  3. Other findings as noted above.   DISPOSITION:  We knew from the initial study that there was modest  plaquing throughout the proximal LAD.  The patient has developed class  III-IV symptoms now, and there appears to be some progression of that.  As a result, successful percutaneous intervention would involve stenting  from the ostium all the way up to the mid LAD and area that we compress  more than likely about 50-60 mm with a stent, requiring overlapping  small caliber stents with drug illusion in an important LAD territory.  The distal LAD wraps the apical tip supplies the distal inferior wall.  It is a vessel that is suitable for off-pump LIMA implantation.  I have  consulted the surgeons and talked to them about the various options, and  then extensively explained it to the patient's sisters as well as the  patient as to regard to treatment strategy.  Dr. Barry Dienes is in the process  is seeing the patient and will proceed with the surgical plan.      Arturo Morton. Riley Kill, MD, [facility]  Electronically Signed     TDS/MEDQ  D:  11/03/2008  T:  11/04/2008  Job:  [id number]   cc:   [name], MD,FACC

## 2011-04-20 NOTE — Consult Note (Signed)
[name] Graham, [name] Graham             ACCOUNT NO.:  [id number]   MEDICAL RECORD NO.:  [id number]          PATIENT TYPE:  INP   LOCATION:  2920                         FACILITY:  [facility]   PHYSICIAN:  Salvatore Decent. Cornelius Moras, M.D. DATE OF BIRTH:  1934-07-28   DATE OF CONSULTATION:  10/29/2008  DATE OF DISCHARGE:                                 CONSULTATION   REQUESTING PHYSICIAN:  Arturo Morton. Riley Kill, MD, [facility]   PRIMARY CARDIOLOGIST:  [name], MD, [facility]   PRIMARY CARE PHYSICIAN:  Doreen Beam, MD   REASON FOR CONSULTATION:  Severe single-vessel coronary artery disease.   HISTORY OF PRESENT ILLNESS:  [name] Graham is a 77 year old female from  [place], [place] with known history of coronary artery disease.  She  originally presented in September 2009 with symptoms of classic  exertional angina.  She underwent cardiac catheterization on August 15, 2008, demonstrating long segment 70% stenosis of the left anterior  descending coronary artery.  She was treated medically.  She now returns  with accelerating symptoms of angina over the last several days for  which she returned to see Dr. Andee Lineman.  Plans were made for elective  percutaneous coronary intervention and the patient was loaded with oral  Plavix.  The patient was brought in for elective cardiac catheterization  today by Dr. Riley Kill.  She was found to have progression of single-  vessel coronary artery disease in the left anterior descending coronary  artery, now extending all the way back to the ostium at the bifurcation  with left main.  Percutaneous coronary intervention is felt to be  somewhat risky and associated with significant increased risk for a  stent thrombosis.  Cardiothoracic surgical consultation was requested.   REVIEW OF SYSTEMS:  GENERAL:  The patient reports otherwise feeling  well.  She does have some mild exertional fatigue.  Her appetite is  stable and she has not been gaining nor losing weight.  CARDIAC:  Notable for classical history of exertional angina over several months,  with 3-day history of accelerated symptoms.  The patient states that  over the last few days, she has had frequent episodes of classic  exertional chest discomfort occurring with relatively mild activity.  Symptoms are almost always relieved by rest.  The symptoms are usually  transient, lasting less than 5-7 minutes.  The patient denies any  nocturnal angina.  The patient denies any prolonged episodes of chest  pain unrelieved by rest.  The patient states that her chest pain is  associated with mild shortness of breath.  The patient otherwise does  not have any shortness of breath either with activity or at rest.  She  denies PND, orthopnea, lower extremity edema.  She denies palpitations  or syncope.  RESPIRATORY:  Negative.  The patient denies productive  cough, hemoptysis, wheezing.  GASTROINTESTINAL:  Negative.  The patient  does have history of GE reflux disease with symptoms that have been well  controlled on medical therapy.  She reports normal bowel function with  no history of hematochezia, hematemesis, nor melena.  MUSCULOSKELETAL:  Negative.  NEUROLOGIC:  Negative.  GENITOURINARY:  Negative.  INFECTIOUS:  Negative.  HEENT:  Negative.   PAST MEDICAL HISTORY:  1. Coronary artery disease.  2. Hypertension.  3. Hyperlipidemia.  4. Left lung nodule, reportedly benign-appearing.   PAST SURGICAL HISTORY:  1. Hysterectomy.  2. Right hand surgery.   FAMILY HISTORY:  Notable for presence of coronary artery disease.  The  patient's mother died at 9 with coronary artery disease, and the  patient's father died at age 76 with coronary artery disease.   SOCIAL HISTORY:  The patient is married and lives with her husband in  [place].  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  She is a  nonsmoker and denies history of significant alcohol  consumption.   MEDICATIONS PRIOR TO ADMISSION:  1. Imdur 120 mg daily.  2. Metoprolol 25 mg daily.  3. Aspirin 325 mg daily.  4. Simvastatin 40 mg daily.  5. Nitroglycerin 0.4 mg sublingual as needed.   DRUG ALLERGIES:  None known.   PHYSICAL EXAMINATION:  GENERAL:  The patient is a well-appearing female  who appears her stated age in no acute distress.  She is currently in  the Cardiac cath lab holding area in supine position.  She is in normal  sinus rhythm.  She is normotensive.  She is afebrile.  HEENT:  Unrevealing.  NECK:  Supple.  There is no cervical nor supraclavicular  lymphadenopathy.  There is no jugular venous distention.  No carotid  bruits are noted.  CHEST:  Auscultation of the chest reveals clear breath sounds  anteriorly.  Breath sounds are symmetrical.  No wheezes nor rhonchi are  noted.  CARDIOVASCULAR:  Regular rate and rhythm.  No murmurs, rubs or gallops  are appreciated.  ABDOMEN:  Soft, nondistended, and nontender.  There are no palpable  masses.  Bowel sounds are present.  EXTREMITIES:  Warm and well-perfused.  There is no lower extremity  edema.  The patient just had a heart cath sheath removed from the right  groin.  Distal pulses are easily palpable in both lower legs in the  posterior tibial position.  There is no sign of significant venous  insufficiency.  RECTAL and GU:  Both deferred.  NEUROLOGIC:  Grossly nonfocal and symmetrical throughout.   DIAGNOSTIC TEST:  Cardiac catheterization performed today by Dr. Riley Kill  is reviewed and compared with previous catheterization from September  2009.  The patient has severe single-vessel coronary artery disease with  long segment stenosis of the left anterior descending coronary artery  that now begins at the ostium and extends over more than half the length  of the vessel.  The distal vessel appears normal.  There is no  significant disease in the left circumflex nor right coronary artery.  Left  ventricular function appears normal.   IMPRESSION:  Severe single-vessel coronary artery disease with long  segment left anterior descending coronary artery stenosis that appears  to be relatively unfavorable for percutaneous coronary intervention.  The patient presents with accelerating symptoms of unstable angina.  She  has not had any chest pain today.  She is clinically stable.  She  received 600 mg of Plavix this morning.  I believe she would best be  treated with surgical revascularization, possibly off-pump coronary  artery bypass grafting left internal mammary artery to the distal left  anterior descending coronary artery.  She does have history of a left  lung nodule, allegedly benign-appearing on  previous imaging studies.   PLAN:  I have discussed the matters briefly with Ms. Deats here in the  cath lab holding area.  She seems to be somewhat reluctant to consider  surgical intervention.  We will continue to follow along and can  tentatively plan for surgery by one of my partners once the  effects of  Plavix is dissipated from her system assuming that the patient is  agreeable.  All of her questions have been addressed.      Salvatore Decent. Cornelius Moras, M.D.  Electronically Signed     CHO/MEDQ  D:  10/29/2008  T:  10/30/2008  Job:  [id number]   cc:   Arturo Morton. Riley Kill, MD, [facility]  [name], MD,FACC  Doreen Beam, MD

## 2011-04-20 NOTE — Cardiovascular Report (Signed)
NAMEMarland Kitchen  HAIZLEY, CANNELLA             ACCOUNT NO.:  [id number]   MEDICAL RECORD NO.:  [id number]          PATIENT TYPE:  OIB   LOCATION:  1963                         FACILITY:  [facility]   PHYSICIAN:  Arturo Morton. Riley Kill, MD, FACCDATE OF BIRTH:  1934-08-23   DATE OF PROCEDURE:  08/15/2008  DATE OF DISCHARGE:                            CARDIAC CATHETERIZATION   INDICATIONS:  Ms. Wojtaszek is a pleasant 77 year old who has known LAD  disease.  More recently, she has had increased frequency of angina as a  result, cardiac catheterization was recommended by Dr. Andee Lineman.   PROCEDURES:  1. Left heart catheterization.  2. Selective coronary arteriography.  3. Selective left ventriculography.   DESCRIPTION OF PROCEDURE:  The procedure was performed from the right  femoral artery using 4-French catheter and 4-French sheath.  She  tolerated the procedure well without complication and was taken to the  holding area for sheath removal.   HEMODYNAMIC DATA:  1. Central aortic pressure was 118/67, mean 90.  2. Left ventricular pressure 116/70.  3. There was no gradient, no pullback across aortic valve.   ANGIOGRAPHIC DATA:  1. Ventriculography done in the RAO projection reveals preserved      global systolic function.  No segmental abnormalities or      contraction were identified.  2. The left main is free of critical disease.  3. The LAD has about 40%-50% narrowing of the proximal vessel.  There      is a very small diagonal branch, which has its exit just after the      proximal vessel and has about 80% ostial stenosis.  Just distal to      this is a long area of segmental disease.  This probably      demonstrates about 75% luminal reduction and the overall diameter      of the residual vessel appears to be probably about 2 mm or less,      and over a segmental area.  The LAD then opens up and supplies the      apex.  4. The circumflex has three marginal branches.  It is relatively      smooth  throughout without significant focal obstruction.  5. The right coronary artery is a dominant vessel with a posterior      descending branch that bifurcates, smaller posterolateral system,      the RCA, and its branches are free of critical disease.   CONCLUSION:  1. Well-preserved global systolic function with no definite wall      motion abnormalities and ejection fraction in excess of 60% by left      ventriculography.  2. Diffuse segmental plaquing of the left anterior descending artery      after the diagonal branch of intermediate severity and 80%      narrowing of a small ostial diagonal.  3. No critical disease of the circumflex or RCA.   CONCLUSIONS:  1. Single-vessel coronary artery disease.  2. Preserved left ventricular function.   DISPOSITION:  Plan to see the patient in the office on Monday and 8:45.  We will review the  films with her in detail, discuss options, and  treatments.  Long-term follow-up will be arranged with Dr. Andee Lineman.      Arturo Morton. Riley Kill, MD, [facility]  Electronically Signed     TDS/MEDQ  D:  08/15/2008  T:  08/16/2008  Job:  [id number]   cc:   CV Laboratory  [name], MD,FACC

## 2011-04-20 NOTE — Assessment & Plan Note (Signed)
OFFICE VISIT   [name] Graham, [name] Graham  DOB:  1934/03/18                                        December 02, 2008  CHART #:  [id number]   The patient comes in today for a postoperative 3-week followup.  She is  status post coronary artery bypass grafting x2 (left internal mammary  artery to the LAD and saphenous vein graft to the diagonal) on  11/04/2008.  Her postoperative course was uneventful and she was  discharged home on November 10, 2008, without complications.  Since her  discharge, she has been doing well.  She is ambulating daily and has had  no complaints of chest pain or shortness of breath.  She initially had  some swelling in her lower extremities, but this has gotten better and  has mostly resolved.  She continues to have some tenderness around her  right EVH incision, but this is also improving.  She is down to taking  the pain medication mostly at bedtime.  She has not seen her  cardiologist as yet.  However, she does have an appointment to see Dr.  Andee Lineman on December 24, 2008.  This is a soonest that they could get her  in.  She has been contacted by cardiac rehabilitation and is  contemplating starting the program.  She is otherwise doing well.   PHYSICAL EXAMINATION:  VITAL SIGNS:  Blood pressure is 123/71, pulse is  81, respirations 18 and unlabored, and O2 sat 98% on room air.  CHEST:  Her sternal chest tube and right lower extremity EVH incisions  are all well healed.  Her sternum is stable to palpation.  HEART:  Regular rate and rhythm without murmurs, rubs, or gallops.  LUNGS:  Clear.  EXTREMITIES:  She has no significant lower extremity edema.   Chest x-ray shows a resolved right effusion and only a tiny left  effusion.   ASSESSMENT AND PLAN:  The patient is doing well, status post coronary  artery bypass graft.  She is going to try to schedule an appointment  with the cardiologist's office sooner, but if she is unable to obtain an  earlier appointment, she is asked to definitely keep the one she  currently has for December 24, 2008.  In the interim, she has been given  refills on Lopressor 50 mg b.i.d., simvastatin 40 mg nightly, and  Ultram, #40.  She is released to begin driving short distances during  the daylight hours at this point and to resume her regular activities.  I have encouraged her to proceed with cardiac rehabilitation if at all  possible and she will consider this.  At this point, we will discharge  her from our care.  We will see her back on a p.r.n. basis.   Kerin Perna, M.D.  Electronically Signed   GC/MEDQ  D:  12/02/2008  T:  12/02/2008  Job:  [id number]   cc:   [name], MD,FACC

## 2011-04-20 NOTE — Assessment & Plan Note (Signed)
[facility]                          [facility] NOTE   NAME:[name] Graham, [name] Graham                      MRN:          [id number]  DATE:10/26/2007                            DOB:          [birth year]    PRIMARY CARDIOLOGIST:  [name], MD.   REASON FOR VISIT:  Scheduled followup.   Since last seen here in the clinic this past February, the patient  essentially reports no significant change in her baseline status of  intermittent chest discomfort. She has a known history of nonobstructive  coronary artery disease, following a false positive Cardiolite stress  test. When last seen in the clinic, the patient was advised to increase  her Imdur to 60 daily for treatment of possible esophageal reflux and  spasm. She was also placed on Nexium.   The patient informs me today that she continues to take sublingual  nitroglycerin occasionally, but that there has been no increase in  frequency since her last visit.   The patient also is concerned that she may be having panic attacks, and  inquired into having something prescribed for her for this.   Electrocardiogram today reveals NSR at 90 bpm with normal axis and no  acute changes; incomplete left bundle branch block.   MEDICATIONS:  1. Simvastatin 20 daily.  2. Nexium 40 daily.  3. Isosorbide 30 daily.  4. Nitrostat p.r.n.   PHYSICAL EXAMINATION:  VITAL SIGNS:  Blood pressure 115/77, pulse 92,  regular, weight 115.  GENERAL:  A 77 year old female sitting upright in no distress.  HEENT:  Normocephalic, atraumatic.  NECK:  Palpable bilateral carotid pulses without bruits; no JVD.  LUNGS:  Clear to auscultation in all fields.  HEART:  Regular rate and rhythm (S1, S2), positive S4. No significant  murmurs.  ABDOMEN:  Benign.  EXTREMITIES:  No edema.  NEUROLOGIC:  Very flat affect, no focal deficit.   IMPRESSION:  1. Recurrent chest pain.      a.     Nonobstructive coronary artery disease  by cardiac       catheterization, March 2007.      b.     Question esophageal spasm.      c.     Normal left ventricular function.  2. Gastroesophageal reflux disease.  3. Anxiety disorder.   PLAN:  1. The patient is to take one baby aspirin a day, as previously      recommended.  2. Once again the patient is instructed to up titrate Imdur to 60      daily, for more aggressive management of recurrent chest pain.  3. Prescribed Xanax 0.25 mg q.h.s. p.r.n., for anxiety.  4. Schedule return clinic followup with myself and Dr. Andee Lineman in 6      months.      Gene Serpe, PA-C  Electronically Signed      [name], MD,FACC  Electronically Signed   GS/MedQ  DD: 10/26/2007  DT: 10/27/2007  Job #: [id number]   cc:   Doreen Beam

## 2011-04-20 NOTE — Assessment & Plan Note (Signed)
[facility]                          [facility] NOTE   NAME:Graham Graham                      MRN:          [id number]  DATE:07/10/2009                            DOB:          [birth year]    REFERRING PHYSICIAN:  Doreen Beam, MD   HISTORY OF PRESENT ILLNESS:  The patient is a pleasant 77 year old  female with a history of coronary bypass grafting by Dr. Donata Clay with  LIMA to LAD and saphenous vein graft to diagonal in 2009.  The patient  has been chest pain free for at least year until 2 months ago, she  started complaining of recurrent substernal chest pain on exertion.  She  state that her chest pain pattern is similar to angina that she  experienced in the past.  Her chest pain occurs at moderate exertion.  There is no chest pain addressed.  There is no orthopnea, PND,  palpitations, or syncope.   MEDICATIONS:  1. Simvastatin 40 mg p.o. nightly.  2. Metoprolol 50 mg p.o. b.i.d., but the patient want to take it once      a day.  3. Nu-Iron 50 mg p.o. b.i.d.  4. Aspirin 325 mg p.o. daily.  Patient discontinued just because of      bruising.   PHYSICAL EXAMINATION:  VITAL SIGNS:  Blood pressure 122/77, heart rate  171 beats per minute, weight is 118 pounds.  GENERAL:  Well-nourished white female in no apparent distress, somewhat  anxious.  HEENT:  Pupils are equal.  Conjunctivae clear.  NECK:  Supple.  Normal carotid upstroke.  No carotid bruits.  LUNGS:  Clear breath sounds bilaterally.  HEART:  Regular rate and rhythm.  Normal S1 and S2.  No murmurs, rubs,  or gallops.  ABDOMEN:  Soft, nontender.  No rebound or guarding.  Good bowel sounds.  EXTREMITIES:  No cyanosis, clubbing, or edema.  NEURO:  Patient is alert, oriented, and grossly nonfocal.   PROBLEMS:  1. Recurrent substernal chest pains suggestive of angina.  2. Status post current bypass grafting with a left internal mammary      artery to the left anterior  descending and a saphenous vein graft      to the diagonal, approximately one year ago.  3. Easy bruisability.  4. Thrombocytopenia, chronic.  5. Left bundle-branch block.  6. Lung nodule reportedly followed by Dr. Sherril Croon.  7. History of mild normocytic anemia.  8. Anxiety disorder.   PLAN:  1. The patient's chest pain is very concerning for angina and I      suspect she may have recurrent native disease.  I have started the      patient on p.r.n. nitroglycerin and Imdur 30 mg p.o. daily.  She is      currently disinclined to take aspirin because of her easy      bruisability and indeed she had prior thrombocytopenia.  We will      check her CBC for resuming aspirin.  2. I told the patient that if her symptoms do not resolve under the      current medical therapy,  possibly we will repeat cardiac      catheterization versus stress test.  3. The patient also will need risk factor modification and blood work      will be ordered.  Her last LDL was 118 in November 2009.  4. The patient reports to me that there was no further followup for      lung nodule, I have ordered chest x-ray PA and lateral.  5. CBC and BMET also will be ordered given her history of anemia and      thrombocytopenia as outlined above.     [name], MD,FACC  Electronically Signed    GED/MedQ  DD: 07/10/2009  DT: 07/11/2009  Job #: [id number]   cc:   Graham Beam, MD

## 2011-04-20 NOTE — Assessment & Plan Note (Signed)
[facility]                          [facility]   NAME:Cwik, CAMIELLE                      MRN:          [id number]  DATE:08/08/2008                            DOB:          October 09, 1934    HISTORY OF PRESENT ILLNESS:  The patient is a 77 year old female with a  history of progressive exertional angina.  On her last office visit on  June 03, 2008, we increased the patient's medical regimen as she was  very apprehensive to proceed with cardiac catheterization.  She tells me  that although her symptoms have improved and her angina pattern has  stabilized, she still has substernal chest pain really on fairly minimal  exertion walking up a couple flights of stairs or walking a short  distance.  She has had no resting angina.  She takes occasional  nitroglycerin with rapid improvement in her symptoms.   MEDICATIONS:  1. Imdur 120 mg p.o. daily.  2. Simvastatin 40 mg p.o. nightly.  3. Metoprolol ER 25 mg p.o. daily.  4. Aspirin 325 mg p.o. daily.  5. Multivitamin.   PHYSICAL EXAMINATION:  VITAL SIGNS:  Blood pressure 153/88, heart rate  83, and weight 124 pounds.  NECK:  Normal carotid upstroke.  No carotid bruits.  LUNGS:  Clear breath sounds bilaterally.  HEART:  Regular rate and rhythm.  Normal S1 and S2.  No murmurs, rubs,  or gallops.  ABDOMEN:  Soft, nontender.  No rebound or guarding.  Good bowel sounds.  EXTREMITIES:  No cyanosis, clubbing, or edema.   PROBLEMS:  1. Exertional angina.  2. Nonobstructive coronary artery disease with mid LAD lesion 50% in      March 2007.  3. Normal LV function.  4. Left bundle-branch block.  5. Family history of coronary artery disease.  6. Stable left lower nodule status post CT scan during recent      hospitalization several months ago.  7. Mild normocytic anemia.  8. Mild thrombocytopenia.  Platelet count previously around 80,000.   PLAN:  1. The patient states that she is now  willing to proceed with cardiac      catheterization.  She still has discomfort really with minimal      exertion and her medical therapy has been increased albeit with      some success, but no resolution of her symptoms.  2. We will check all the blood work to make sure the patient is not      particularly      thrombocytopenic and can have safely cardiac catheterization done.  3. The patient also has requested liberal use of analgesics and      sedatives as she remains apprehensive about her cardiac      catheterization.     [name], MD,FACC  Electronically Signed    GED/MedQ  DD: 08/08/2008  DT: 08/09/2008  Job #: [id number]

## 2011-04-20 NOTE — Assessment & Plan Note (Signed)
[facility]                          [facility]   NAME:[name] Graham, [name] Graham                      MRN:          [id number]  DATE:12/24/2008                            DOB:          27-Sep-1934    REFERRING PHYSICIAN:  Doreen Beam, MD   HISTORY OF PRESENT ILLNESS:  The patient is a 77 year old female who  underwent recent coronary bypass grafting by Dr. Donata Clay with a LIMA  to the LAD and a saphenous vein graft to the diagonal.  She had an  uneventful postoperative course.  She presents for followup.  She denies  any chest pain or shortness of breath, orthopnea, or PND.  EKG  demonstrates normal sinus rhythm.  She has had no arrhythmias.   MEDICATIONS:  1. Simvastatin 40 mg p.o. nightly.  2. Metoprolol 25 mg p.o. daily.  3. Aspirin 325 mg p.o. daily.  4. Multivitamin p.o. daily.   PHYSICAL EXAMINATION:  VITAL SIGNS:  Blood pressure 118/70, heart rate  68, respiratory rate 55.  NECK:  Normal carotid upstroke.  No carotid bruits.  LUNGS:  Clear breath sounds bilaterally.  HEART:  Regular rate and rhythm.  Normal S1, S2.  No murmurs, rubs, or  gallops.  ABDOMEN:  Soft and nontender.  No rebound or guarding.  Good bowel  sounds.  EXTREMITIES:  No cyanosis, clubbing, or edema.  Sternotomy scar is well  healed.  Also, the saphenous vein graft harvesting site on the right leg  is well healed.   PROBLEM LIST:  1. Status post coronary bypass grafting.  2. Left bundle branch block.  3. Lung nodule.   PLAN:  1. The patient will follow up with [place] in 6 months.  At that time, she      can have a followup for her lung nodule.  2. The patient has still no complications post coronary bypass      grafting, and I made no change in her medical regimen.     [name], MD,FACC  Electronically Signed   GED/MedQ  DD: 12/24/2008  DT: 12/25/2008  Job #: [id number]   cc:   Doreen Beam, MD

## 2011-04-20 NOTE — Letter (Signed)
August 19, 2008    [name] Beam, MD  [address]  [place], [place] [address]   RE:  [name], Graham  MRN:  [id number]  /  DOB:  1934/05/19   Dear Herminio Commons:   I have had the pleasure of seeing [name] Graham today in the  office following her cardiac catheterization.  I brought her in and  reviewed the films with her in detail.  She was accompanied by her  husband.  She has exertional chest discomfort that is described as a  tightness in the midchest usually associated with activity or exertion.  If I were to put her in a classification, I would call her symptoms  class III according to the [employer] Association classification.  She has not had significant pain at rest.  It almost seems all related  to exertion.  She underwent diagnostic cardiac catheterization.  This  revealed noncritical disease of the right circumflex and there were  segmental disease of the left anterior descending artery beyond the  diagonal branch.  The vessel is a somewhat smaller caliber vessel,  probably being between 2.2 and 2.4 in diameter.  The plaque is also  segmental.  Whether this is causing her symptoms or not, it is not  entirely clear, but it is highly suspicious especially when you take the  cumulative views that are involved.  Moreover, her symptoms are fairly  classic in their description.   MEDICATIONS:  1. Imdur 120 mg daily.  2. Simvastatin 40 mg nightly.  3. Metoprolol ER 25 mg daily.  4. Aspirin 325 mg daily.  5. Multivitamin daily.   On physical, she is alert, oriented, and in no distress.  The lung  fields are clear to auscultation and percussion.  The PMI is not  displaced.  I do not appreciate a murmur.  The carotid upstrokes are  brisk without bruits.  The abdomen is soft.  The femoral site of  catheterization appears to be intact.   Her baseline electrocardiogram is unchanged demonstrating a left bundle  branch block.   To summarize, [name] Graham has what sounds like  class II to class III  symptoms.  I have reviewed in detail with her the results from COURAGE  trial.  After thorough consideration of her symptoms, we have talked  about percutaneous coronary intervention.  We have tentatively discussed  doing this later this week and I have started her on a prescription that  involves Plavix.  I have explained the use of Plavix to her.  I have  also talked with her about the procedure accompanied by her husband.  We  reviewed the films as I mentioned.  Risks, benefits, and alternatives  have been discussed and she is considering this and I will call her  tonight.  If not, we might consider adding a calcium channel antagonist  to her regimen to see if this improves her symptoms.  We are also in the  process of obtaining her chest x-ray which was done last week at  [facility].  I will keep you informed as to her progress and  hopefully we will let you know.  We did discuss the options in detail.    Sincerely,      Arturo Morton. Riley Kill, MD, [facility]  Electronically Signed    TDS/MedQ  DD: 08/19/2008  DT: 08/20/2008  Job #: [id number]   CC:    [name], MD,FACC

## 2011-04-21 ENCOUNTER — Ambulatory Visit (INDEPENDENT_AMBULATORY_CARE_PROVIDER_SITE_OTHER): Payer: Medicare Other | Admitting: Physician Assistant

## 2011-04-21 ENCOUNTER — Encounter: Payer: Self-pay | Admitting: *Deleted

## 2011-04-21 ENCOUNTER — Encounter: Payer: Self-pay | Admitting: Cardiology

## 2011-04-21 DIAGNOSIS — I10 Essential (primary) hypertension: Secondary | ICD-10-CM

## 2011-04-21 DIAGNOSIS — I2510 Atherosclerotic heart disease of native coronary artery without angina pectoris: Secondary | ICD-10-CM

## 2011-04-21 DIAGNOSIS — E785 Hyperlipidemia, unspecified: Secondary | ICD-10-CM

## 2011-04-21 DIAGNOSIS — E782 Mixed hyperlipidemia: Secondary | ICD-10-CM

## 2011-04-21 DIAGNOSIS — E1169 Type 2 diabetes mellitus with other specified complication: Secondary | ICD-10-CM | POA: Insufficient documentation

## 2011-04-21 DIAGNOSIS — I38 Endocarditis, valve unspecified: Secondary | ICD-10-CM

## 2011-04-21 MED ORDER — NITROGLYCERIN 0.4 MG SL SUBL: 0.4000 mg | SUBLINGUAL_TABLET | SUBLINGUAL | Status: DC | PRN

## 2011-04-21 MED ORDER — ISOSORBIDE MONONITRATE ER 120 MG PO TB24: 120.0000 mg | ORAL_TABLET | ORAL | Status: DC

## 2011-04-21 NOTE — Assessment & Plan Note (Signed)
Will schedule an exercise stress Cardiolite for risk stratification.The option of a repeat catheterization was offered, but patient is disinclined to proceed, at this point in time. We'll also increase Imdur to 120 mg daily, and provide a prescription for nitroglycerin. We will schedule a return visit with myself and Dr. Andee Lineman in one month, for review of study results and further recommendations. We will keep a low threshold for coronary angiography.

## 2011-04-21 NOTE — Assessment & Plan Note (Signed)
Will order a 2-D echo for reassessment of valvular disease, previously assessed as mild, 2/11.

## 2011-04-21 NOTE — Assessment & Plan Note (Signed)
We'll reassess lipid status with FLP/LFT profile. Target LDL 70 or less, if feasible.

## 2011-04-21 NOTE — Progress Notes (Signed)
Clinical Summary [name] Graham is a 77 y.o.female, who presents for scheduled annual followup.  She reports several month history of exertional anterior chest pressure, promptly relieved with rest. She denies any rest angina. She denies any acceleration in frequency or intensity, over the past several weeks.  She reports some similarity to her symptoms preceding her CABG in 2009. She is currently without chest pain, and with no acute changes on electrocardiogram.   No Known Allergies  Current outpatient prescriptions:aspirin 81 MG tablet, Take 81 mg by mouth daily.  , Disp: , Rfl: ;  Calcium Carbonate-Vit D-Min 600-400 MG-UNIT TABS, Take 1 tablet by mouth daily.  , Disp: , Rfl: ;  esomeprazole (NEXIUM) 40 MG capsule, Take 40 mg by mouth daily before breakfast.  , Disp: , Rfl: ;  hydrochlorothiazide (HYDRODIURIL) 12.5 MG tablet, Take 12.5 mg by mouth daily.  , Disp: , Rfl:  isosorbide mononitrate (IMDUR) 60 MG 24 hr tablet, Take 90 mg by mouth daily. , Disp: , Rfl: ;  metoprolol (LOPRESSOR) 50 MG tablet, Take 50 mg by mouth 2 (two) times daily.  , Disp: , Rfl: ;  Multiple Vitamins-Minerals (MULTIVITAMIN WITH MINERALS) tablet, Take 1 tablet by mouth daily.  , Disp: , Rfl: ;  nitroGLYCERIN (NITROSTAT) 0.4 MG SL tablet, Place 0.4 mg under the tongue every 5 (five) minutes as needed.  , Disp: , Rfl:  simvastatin (ZOCOR) 40 MG tablet, Take 40 mg by mouth at bedtime.  , Disp: , Rfl: ;  DISCONTD: ALPRAZolam (XANAX) 0.25 MG tablet, Take 0.25 mg by mouth at bedtime as needed.  , Disp: , Rfl: ;  DISCONTD: potassium chloride (KLOR-CON) 10 MEQ CR tablet, Take 10 mEq by mouth daily.  , Disp: , Rfl:   Past Medical History  Diagnosis Date  . Exertional angina   . Coronary artery disease     single vessel of the LAD  . Normal echocardiogram     normal lv function  . Bundle branch block   . Lung nodule     lower  . Anemia     mild normocytic anemia  . Thrombocytopenia     platelet count 80,000  . Anxiety     . GERD (gastroesophageal reflux disease)     Social History [name] Graham reports that she has never smoked. She does not have any smokeless tobacco history on file. [name] Graham reports that she does not drink alcohol.  Review of Systems The patient denies fatigue, malaise, fever, weight gain/loss, vision loss, decreased hearing, hoarseness, chest pain, palpitations, shortness of breath, prolonged cough, wheezing, sleep apnea, coughing up blood, abdominal pain, blood in stool, nausea, vomiting, diarrhea, heartburn, incontinence, blood in urine, muscle weakness, joint pain, leg swelling, rash, skin lesions, headache, fainting, dizziness, depression, anxiety, enlarged lymph nodes, easy bruising or bleeding, and environmental allergies.     Physical Examination Filed Vitals:   04/21/11 1504  BP: 121/68  Pulse: 58   General: Well-developed, well-nourished in no distress head: Normocephalic and atraumatic eyes PERRLA/EOMI intact, conjunctiva and lids normal nose: No deformity or lesions mouth normal dentition, normal posterior pharynx neck: Supple, no JVD.  No masses, thyromegaly or abnormal cervical nodes lungs: Normal breath sounds bilaterally without wheezing.  Normal percussion heart: regular rate and rhythm with normal S1 and S2, no S3 or S4.  PMI is normal.  No pathological murmurs abdomen: Normal bowel sounds, abdomen is soft and nontender without masses, organomegaly or hernias noted.  No hepatosplenomegaly musculoskeletal: Back normal, normal gait muscle  strength and tone normal pulsus: Pulse is normal in all 4 extremities Extremities: No peripheral pitting edema neurologic: Alert and oriented x 3 skin: Intact without lesions or rashes cervical nodes: No significant adenopathy psychologic: Normal affect   ECG Normal sinus rhythm at 62 bpm; borderline LAD; nonspecific ST changes   Studies   Problem List and Plan

## 2011-04-21 NOTE — Patient Instructions (Addendum)
   Exercise stress test - do not hold medications Your physician has requested that you have an echocardiogram. Echocardiography is a painless test that uses sound waves to create images of your heart. It provides your doctor with information about the size and shape of your heart and how well your heart's chambers and valves are working. This procedure takes approximately one hour. There are no restrictions for this procedure. Your physician recommends that you also have labs for fasting lipid pane & liver function test.  Reminder:  Nothing to eat or drink after 12 midnight prior to labs.  You can have these labs done same day as testing above. Nitroglycerin - as needed for severe chest pain only  Increase Imdur to 120mg  daily  Your physician wants you to follow up in:  1 month.  See above for appointment.

## 2011-04-21 NOTE — Assessment & Plan Note (Signed)
Well-controlled on current regimen. ?

## 2011-04-22 ENCOUNTER — Other Ambulatory Visit: Payer: Self-pay | Admitting: Physician Assistant

## 2011-04-22 ENCOUNTER — Telehealth: Payer: Self-pay | Admitting: *Deleted

## 2011-04-22 DIAGNOSIS — I2510 Atherosclerotic heart disease of native coronary artery without angina pectoris: Secondary | ICD-10-CM

## 2011-04-22 NOTE — Telephone Encounter (Signed)
NO PRECERT REQUIRED FOR 2D ECHO.  MEDICARE AND MUTUAL OF [facility]

## 2011-04-22 NOTE — Telephone Encounter (Signed)
Checking percert for  2 D ECHO  On 04-27-2011 @ [facility]

## 2011-04-23 NOTE — Cardiovascular Report (Signed)
NAMEMarland Kitchen  [name] Graham, [name] Graham             ACCOUNT NO.:  [id number]   MEDICAL RECORD NO.:  [id number]          PATIENT TYPE:  INP   LOCATION:  3706                         FACILITY:  [facility]   PHYSICIAN:  Arturo Morton. Riley Kill, M.D. [facility] OF BIRTH:  02-Nov-1934   DATE OF PROCEDURE:  10/28/2004  DATE OF DISCHARGE:                              CARDIAC CATHETERIZATION   INDICATIONS:  Ms. Dalporto is a 77 year old woman who presents with some  recurrent substernal chest pain.  The pain has been rather impressive.  It  is exertional to some extent, and she has been taking nitroglycerin on a  regular basis.  The current study was done to assess coronary anatomy.   PROCEDURES:  1.  Left heart catheterization.  2.  Selective coronary arteriography.  3.  Selective left ventriculography.  4.  Intracoronary nitroglycerin administration.  5.  Aortic root aortography.   DESCRIPTION OF PROCEDURE:  The patient was brought to the catheterization  lab and prepped and draped in the usual fashion.  Through an anterior  puncture, the  right femoral artery was easily entered.  6 French sheath was  placed.  Central aortic and left ventricular pressures were measured with  pigtail.  Ventriculography was performed in the RAO projection.  Proximal  root aortography was then performed without complication.  Following this,  RCA angiography was performed.  Following this, LCA angiography was  performed and we gave intracoronary nitroglycerin to better highlight the  LAD vessel.  She tolerated the procedure well.  She did have chest pain  during every coronary injection.  Discomfort was relieved by nitroglycerin.  There were no occluded vessels during injection.   HEMODYNAMIC DATA:  1.  Central aortic pressure 147/79, mean 109.  2.  Left ventricle:  151/10.  3.  No gradient on pullback across the aortic valve.   ANGIOGRAPHIC DATA:  1.  Ventriculography was performed in the RAO projection.  Overall systolic  function was well preserved and no segmental abnormalities or      contraction were identified.  Left ventricular function was preserved.      There was no significant mitral regurgitation.  2.  Aortic root aortography revealed normal size aortic root without      evidence of dissection or aortic regurgitation.  3.  The left main was free of critical disease.  4.  The left anterior descending artery coursed to the apex.  The LAD had a      couple areas of reduced overall caliber.  In the proximal vessel, there      is a 40-50% area of segmental plaquing in the proximal vessel before the      diagonal.  The diagonal is relatively small with about 50% proximal      narrowing.  In the mid vessel, is a secondary of segmental disease with      focal area of 50-60% narrowing, but with residual lumen of almost 2 mm.      Whether or not this is flow-limiting is difficult to tell, but certainly      does not seem to produce pain at rest.  The distal vessel is patent, but      smaller in caliber.  5.  The circumflex is a fairly large caliber vessel with three marginal      branches.  The first marginal has mild irregularity at the ostium with      no more than 20-30% luminal reduction.  6.  The right coronary artery is a dominant vessel with posterior descending      and posterior lateral branch was free of disease.   CONCLUSION:  1.  Well-preserved left ventricular function.  2.  Tandem lesions of the left anterior descending artery of intermediate      severity.   The patient has some LAD disease.  During injection she had discomfort with  both coronary injection suggestive potentially of Syndrome X.  Nonetheless,  I am concerned about the possibility of LAD ischemia.  The vessels are  somewhat borderline caliber and I would be a bit reluctant to do  intravascular ultrasound in the absence of documented LAD ischemia by  Cardiolite.  Nonetheless, we will increase her medical therapy and get a   Cardiolite.  I have discussed this with Dr. Diona Browner.  If the Cardiolite is  positive, then I would recommend an intravascular ultrasound to better  assess the LAD disease.  This will be explained to the patient in detail.       TDS/MEDQ  D:  10/28/2004  T:  10/28/2004  Job:  [id number]   cc:   Jonelle Sidle, M.D. [facility]   Dr. Wende Crease

## 2011-04-23 NOTE — Discharge Summary (Signed)
[name] Graham, [name] Graham NO.:  [id number]   MEDICAL RECORD NO.:  [id number]                   PATIENT TYPE:  INP   LOCATION:  4702                                 FACILITY:  [facility]   PHYSICIAN:  Pennelope Bracken, PA [facility]               DATE OF BIRTH:  [birth year]   DATE OF ADMISSION:  DATE OF DISCHARGE:  07/12/2002                                 DISCHARGE SUMMARY   ATTENDING CARDIOLOGIST:  Luis Abed, M.D.   REASON FOR ADMISSION:  Chest pain.   DISCHARGE DIAGNOSES:  1. Chest pain, probably musculoskeletal in origin.  2. Nonflow limiting coronary artery disease. Adenosine Cardiolite conducted     at [facility] revealing equivocal changes with some mild reversibility     seen in the distal anterior septum in the setting of normal wall motion.  3. Ejection fraction 55% by cath performed this admission by Dr. Samule Ohm.  4. Hypertension.  5. Left bundle branch block.  6. Palpitations and history of panic attacks.  7. Gastroesophageal reflux disease.   HISTORY OF PRESENT ILLNESS:  This delightful 77 year old lady with a few  cardiac risk factors but no recorded cardiac history except for negative  nuclear study in 1993 came to the attention of our [facility] team with complaints  of sudden onset chest discomfort the day prior to admission while paying a  tax bill. These were accompanied with a feature described as palpations with  accompanying nausea and presyncope. There was on radiation of pain nor was  there dyspnea. She brought these complaints to Dr. Doyne Graham' office and he  recommended admission to [facility]. An Adenosine Cardiolite was conducted  there which was equivocal as described above. Given the uncertainty of the  study and the patient's hypertension and left bundle branch block, it was  decided that she should be transferred to [facility] for further evaluation  and treatment and hospital course. The patient was admitted to telemetry and  maintained  on her admitting medications which included aspirin, beta blocker  and Protonix only. She was taken to cardiac catheterization by Dr. Samule Ohm  with finding described above. She tolerated the procedure well and  experienced no further chest pain. A chest x-ray was obtained on the day of  discharge and this revealed no acute disease. Her fasting lipid panel became  available the day of discharge and her LDL was slightly elevated. We will  defer management of this to Dr. Doyne Graham.  Her hospital course was  complicated by a post procedure period of difficulty voiding. An attempt was  made to close the Foley catheter and this resulted in some trauma. The  patient eventually was able to void on her own and had satisfactory urinary  output at the time of discharge. She was seen by Dr. Myrtis Graham and he felt she  was ready for home.   PHYSICAL EXAMINATION:  The day of discharge, the patient offered  no  complaints of chest pain or dyspnea. OBJECTIVE:  120/70, 72, 20, 97, 99% on  room air. __________ sinus rhythm. GENERAL:  NED. CV:  RRR. LUNGS:  CTA.  EXTREMITIES:  Groin site no edema. Cath site without evidence of hematoma.   LABORATORY DATA:  Discharge chemistries, WBC 7.3, hemoglobin 13.8,  hematocrit 39.3, platelets 136. Chemistries, sodium 140, potassium 3.9,  chloride 108, CO2 24, BUN 14, creatinine 0.8.   Chest x-ray showed no acute disease.   The patient is discharged to home in the care of her very supportive sisters  on the following medications:   DISCHARGE MEDICATIONS:  1. Lopressor 50, 1 b.i.d.  2. Protonix 40, 1 q.d. with food.  3. Aspirin 325 1 q.d.   DISCHARGE ACTIVITIES:  Limitations include no driving, heavy lifting, tub  baths for 2 days.   DISCHARGE DIET:  Low cholesterol diet and the components of this will be  discussed by Dr. Doyne Graham.   WOUND CARE:  The patient agrees to call the office if her groin becomes hard  or painful. She will follow-up with Dr. Doyne Graham in a few  weeks and a copy of  this note will be sent to him. She knows to call us in the interim without  hesitation for any problems, questions or concerns or change or increase in  symptoms.                                                 Pennelope Bracken, PA [facility]    LK/MEDQ  D:  07/12/2002  T:  07/12/2002  Job:  [id number]   cc:   Forrest Moron

## 2011-04-23 NOTE — Cardiovascular Report (Signed)
   [name] Graham, SPEAR NO.:  [id number]   MEDICAL RECORD NO.:  [id number]                   PATIENT TYPE:  INP   LOCATION:  4702                                 FACILITY:  [facility]   PHYSICIAN:  Salvadore Farber, MD [facility]            DATE OF BIRTH:  09/02/1934   DATE OF PROCEDURE:  07/11/2002  DATE OF DISCHARGE:                              CARDIAC CATHETERIZATION   PROCEDURES:  Coronary angiography, left ventriculography, left heart  catheterization.   INDICATIONS:  The patient is a 77 year old lady with hypertension,  gastroesophageal reflux disease, palpitations, and a chronic left bundle-  branch block. She had chest pain with minimal exertion yesterday. This pain  was associated with nausea and palpitations.  She ruled out for myocardial  infarction. Adenosine Cardiolite was limited by a motion artifact, but  suggested a possible distal anterior defect. She was therefore referred for  diagnostic angiography.   DIAGNOSTIC TECHNIQUE:  Informed consent was obtained. Under 2% lidocaine  local anesthesia, a 6 French sheath was placed in the right femoral artery  using the modified Seldinger technique. The JL4 and JR4 catheters were used.  Angiography was performed by hand injection.  A 6 French pigtail catheter  was advanced into the left ventricle. Pressures were measured and  ventriculography performed.  Following the procedure, the sheaths were  removed.   FINDINGS:  1. LV:  Ejection fraction of 55% with mild hypokinesia of the mid anterior     wall.  LV pressure 114/5/10.  2. No aortic stenosis or mitral regurgitation.  3. Left main:  Normal.  4. LAD:  There is a 30% stenosis in the proximal vessel and a 20% stenosis     in the mid vessel.  5. Circumflex:  The circumflex gives rise to three obtuse marginal vessels.     It is without significant stenosis.  6. RCA:  Dominant vessel. It is without significant stenosis.   COMPLICATIONS:  None.   IMPRESSION:  Minimal coronary artery disease.    PLAN:  Will admit her to observation. Given her disease in the LAD system,  we will continue her on aspirin. She will remain on her proton pump  inhibitor for her gastroesophageal reflux disease.                                                     Salvadore Farber, MD [facility]    WED/MEDQ  D:  07/11/2002  T:  07/16/2002  Job:  [id number]   cc:   Wende Crease, M.D.

## 2011-04-23 NOTE — Assessment & Plan Note (Signed)
[facility]                            [facility] OFFICE NOTE   NAME:[name] Graham, [name] Graham                      MRN:          [id number]  DATE:08/24/2006                            DOB:          07-13-1934    HISTORY OF PRESENT ILLNESS:  The patient is a 77 year old female with a  history of nonobstructive coronary artery disease.  The patient is status  post cardiac catheterization in March 2007.  It was felt that the patient  had angina at that time.  She had a stress test which showed possible  anterior ischemia.  She had a prior cardiac catheterization with a 60% LAD  lesion in 2005.  Dr. Antoine Poche performed a catheterization.  I carefully  reviewed the angiographic images and felt that there was no significant  change in the LAD lesion.  Otherwise, the patient had nonobstructive  coronary artery disease.  Her ejection fraction was 65%.  Overall she has  done well.  However, over the last several weeks she reports again onset of  substernal chest pain, which occurs on exertion.  More problematic is,  however, her sensation of dyspnea on minimal exertion.  She feels that her  breathing is really tight when she makes an effort.  She has no radiation of  pain in the neck or arm.  She has no orthopnea or PND.  She has no  palpitations or syncope.  The patient has not tried to take a nitroglycerin  pill to relieve her symptoms.  She also has been discontinued previously off  of Imdur.   MEDICATIONS:  Only Toprol and simvastatin.  She also takes a baby aspirin.   PHYSICAL EXAMINATION:  VITAL SIGNS:  Blood pressure 104/60, heart rate 84  beats per minute.  NECK:  Normal carotid upstroke, no carotid bruits.  LUNGS:  Clear breath sounds bilaterally.  CARDIAC:  Regular rate and rhythm, normal S1, S2.  ABDOMEN:  Soft.  EXTREMITIES:  No cyanosis, clubbing or edema.  NEUROLOGIC:  The patient is alert and oriented and grossly nonfocal.   PROBLEM LIST:  1.  Nonobstructive coronary artery disease.      a.     Atypical chest pain.      b.     Normal ejection fraction, 65%.      c.     Status post catheterization March 03, 2006.  2. Dyspnea.  Rule out lung versus cardiac etiology.   PLAN:  1. The patient will have an EKG done in the office today.  2. I have scheduled the patient for a chest x-ray PA and lateral to make      sure that she has no underlying lung disease as a cause to her dyspnea.  3. The patient's chest pain remains atypical and is similar to her prior      presentation.  We will start her back on Imdur 30 mg a day.  4. The patient can follow up with [place] in 6 months, but I do not think that      there is any need for ischemia testing at this point  in time.                                   [name], MD,FACC   GED/MedQ  DD:  08/24/2006  DT:  08/25/2006  Job #:  [id number]

## 2011-04-23 NOTE — Cardiovascular Report (Signed)
NAME:  SRINIKA, DELONE NO.:  [id number]   MEDICAL RECORD NO.:  [id number]          PATIENT TYPE:  OIB   LOCATION:  1963                         FACILITY:  [facility]   PHYSICIAN:  Rollene Rotunda, M.D.   DATE OF BIRTH:  [birth year]   DATE OF PROCEDURE:  03/03/2006  DATE OF DISCHARGE:                              CARDIAC CATHETERIZATION   PRIMARY CARE PHYSICIAN:  Forrest Moron, MD.   CARDIOLOGIST:  [name], M.D.   PROCEDURE:  Left heart catheterization/coronary arteriography.   INDICATIONS FOR PROCEDURE:  Evaluation patient with chest pain.  She had a  questionably abnormal Cardiolite.  She has known previous nonobstructive  disease with a 50% LAD lesion identified in 2005.   DESCRIPTION OF PROCEDURE:  Left heart catheterization performed via the  right femoral artery. The artery was cannulated using anterior wall  puncture.  A #4 French arterial sheath was inserted via the modified  Seldinger technique.  A preformed Judkins and a pigtail catheter were  utilized.  The patient tolerated the procedure well and left the lab in  stable condition.   RESULTS:  Hemodynamics:  LV 140/18.  AO 150/99.   Coronaries:  The left main was normal.   The LAD had proximal 25% stenosis.  There was mid and proximal long diffuse  25% stenosis.  There was a mid 50% stenosis.  (I compared this very closely  to the 2005 films and it appeared to be unchanged.)  There was a long mid  30% stenosis.  The vessel wrapped the apex.   The circumflex in the AV groove had proximal 25% stenosis.  The ramus  intermediate was moderate sized and normal.  Mid obtuse marginal was small  and normal. The second obtuse marginal was large and normal.   The right coronary artery was a dominant vessel and normal throughout its  course.  The PDA was normal.   Left ventriculogram:  The left ventriculogram was obtained in the RAO  projection.  The EF was 65%.   CONCLUSION:  Nonobstructive coronary  artery disease. There was no change in  the left anterior descending anatomy in that it still appears to be  nonocclusive.  I discussed this with Dr. Andee Lineman who is reviewing her  questionable ischemia on her Cardiolite.  There was a suggestion that this  could be breast artifact.  The patient's  symptoms are very atypical.  Given this whole picture, Dr. Andee Lineman will  decide on any further imaging but we will most likely settle on continued  risk reduction without any objective evidence that this left anterior  descending lesion was causing symptoms.           ______________________________  Rollene Rotunda, M.D.     JH/MEDQ  D:  03/03/2006  T:  03/04/2006  Job:  [id number]   cc:   Forrest Moron  [address] [place]  [place] [address]   [name], M.D. [facility]  [address]  [address]  [place]  [place] [address]

## 2011-04-23 NOTE — Discharge Summary (Signed)
NAMEMarland Kitchen  KALAYLA, SHADDEN             ACCOUNT NO.:  [id number]   MEDICAL RECORD NO.:  [id number]          PATIENT TYPE:  INP   LOCATION:  3706                         FACILITY:  [facility]   PHYSICIAN:  Charlton Haws, M.D.     DATE OF BIRTH:  1934-10-19   DATE OF ADMISSION:  10/27/2004  DATE OF DISCHARGE:  10/30/2004                                 DISCHARGE SUMMARY   PROCEDURES:  1.  Cardiac catheterization.  2.  Coronary arteriogram.  3.  Left ventriculogram.  4.  Adenosine Cardiolite.   HOSPITAL COURSE:  Ms. Rabold is a 77 year old female with a history of  nonobstructive coronary artery disease by catheterization in 2003.  She had  chest pain while walking up a flight of stairs.  It was described as a  heaviness.  She went to [facility] after the pain, and she had taken  two sublingual nitroglycerins and aspirin, and the pain was relieved.  She  was evaluated there, and she had two negative troponins.  She was evaluated  there by cardiology, who felt that repeat catheterization was indicated, and  she was transferred to [facility] for this on October 27, 2004.   Her cardiac enzymes remained negative, and she had a heart catheterization  on October 28, 2004.  The cardiac catheterization showed a 40-50% lesion in  the proximal LAD and a 50-60% stenosis in the mid-LAD.  The first diagonal  had a 50% lesion, and the OM1 had a 20-30% lesion.  Her left ventriculogram  was normal, and her aortic root was normal.  Dr. Riley Kill evaluated the films  and felt that the LAD lesions did not look critical, but her symptoms were  somewhat concerning.  He felt that an in-hospital Cardiolite was indicated.  This was performed on October 30, 2004.   The Cardiolite was negative for ischemia, and her EF was 81%.  She had no  further episodes of chest pain.  She was considered stable for discharge on  October 30, 2004, with outpatient followup arranged.   DISCHARGE CONDITION:  Stable.   DISCHARGE DIAGNOSES:  1.  Chest pain.  No critical coronary artery disease by catheterization.      Medical therapy for noncritical coronary artery disease.  2.  Preserved left ventricular function.  3.  Palpitations.  4.  Hyperlipidemia.  5.  History of left bundle branch block.  6.  Gastroesophageal reflux disease.  7.  History of anxiety panic disorder.   DISCHARGE INSTRUCTIONS:  Her activity level is to include no strenuous  activity for two days.  She is to clean over the catheterization site with  soap and water.  She is to call our office for any problems with the  catheterization site.  She is to follow up with Dr. Doyne Keel, and has a  follow-up appointment with the P.A. for Dr. Diona Browner on November 12, 2004 at  11:45.   DISCHARGE MEDICATIONS:  1.  Imdur 60 mg daily.  2.  Lopressor 25 mg b.i.d.  3.  Multivitamin, calcium, and vitamin D daily.  4.  Aspirin 81 mg daily.  RB/MEDQ  D:  10/30/2004  T:  10/30/2004  Job:  [id number]   cc:   Wende Crease, M.D.  [facility]  [facility]

## 2011-04-23 NOTE — Assessment & Plan Note (Signed)
[name]                          [facility] OFFICE NOTE   NAME:Graham, [name]                      MRN:          [id number]  DATE:01/27/2007                            DOB:          [birth year]    HISTORY OF PRESENT ILLNESS:  The patient is a 77 year old female with  nonobstructive coronary artery disease.  The patient is status post  catheterization March 2007.  She has a 60% LAD lesion.  She now reports  substernal chest pain.  Of note is that the patient has reported these  symptoms previously.  She states that she has some good days and some  bad days.  Sometimes her symptoms are worsened with exertion but that  other times there is actually no change.  Interestingly, the patient  does describe significant symptoms with reflux and symptoms that are  consistent with esophageal spasm.  She states that her chest pain is  somewhat sharp in nature and usually lasts a few minutes, and then goes  away again.  Walking up a flight of stairs or exertion does not  necessarily worsen her symptoms.  Of note, the patient has taken  occasional nitroglycerin with no apparent improvement in her symptoms.   MEDICATIONS:  1. Imdur 30 mg p.o. daily.  2. Simvastatin 20 mg p.o. daily.  3. Aspirin 81 mg p.o. daily.  4. P.r.n. nitroglycerin.   PHYSICAL EXAMINATION:  VITAL SIGNS:  Blood pressure 100/60, heart rate  is 80 beats per minute.  NECK:  Normal carotid upstroke, no carotid bruits.  LUNGS:  Clear breath sounds bilaterally.  HEART:  Regular rate and rhythm, normal S1, S2.  No murmur, rubs or  gallops.  LUNGS:  Clear.  ABDOMEN:  Soft, nontender, no rebound, no guarding, good bowel sounds.  EXTREMITIES:  No cyanosis, clubbing or edema.  NEUROLOGIC:  The patient alert and oriented and grossly nonfocal.   PROBLEM LIST:  1. Atypical chest pain.      a.     Rule out esophageal spasm.      b.     Nonobstructive coronary artery disease.      c.      History of atypical chest pain.      d.     Rule out syndrome X.      e.     Normal ejection fraction 65%.  2. Dyspnea, resolved.  3. Gastroesophageal reflux disease.   PLAN:  1. The patient likely has symptoms of esophageal reflux and possible      spasm.  I have given her a prescription for Nexium.  2. The chest pain is very atypical and the patient had no significant      coronary artery disease by last catheterization.  She had a false-      positive Cardiolite stress study.  At this present time, I did      increase her Imdur to 60 mg a day.  I have asked the patient to      give a me call in the next few days and see if the above measures  are improving her symptomatology.  3. If the patient's symptoms do improve, she can continue on her      current medical regimen and she can be referred for      gastroenterology consult.     [name], MD,FACC  Electronically Signed    GED/MedQ  DD: 01/27/2007  DT: 01/27/2007  Job #: [id number]   cc:   Doreen Beam

## 2011-04-27 DIAGNOSIS — I2510 Atherosclerotic heart disease of native coronary artery without angina pectoris: Secondary | ICD-10-CM

## 2011-04-28 ENCOUNTER — Encounter: Payer: Self-pay | Admitting: *Deleted

## 2011-04-30 ENCOUNTER — Telehealth: Payer: Self-pay | Admitting: *Deleted

## 2011-04-30 NOTE — Telephone Encounter (Signed)
Attempted to notify pt of labs & echo.  No answer.  Will try again later.

## 2011-05-17 NOTE — Telephone Encounter (Signed)
Notified pt this morning of test below.

## 2011-05-20 ENCOUNTER — Encounter: Payer: Self-pay | Admitting: Cardiovascular Disease

## 2011-05-27 ENCOUNTER — Ambulatory Visit: Payer: Self-pay | Admitting: Cardiology

## 2011-07-02 ENCOUNTER — Ambulatory Visit: Payer: Self-pay | Admitting: Cardiovascular Disease

## 2011-07-02 ENCOUNTER — Other Ambulatory Visit: Payer: Self-pay | Admitting: *Deleted

## 2011-07-02 MED ORDER — SIMVASTATIN 40 MG PO TABS: 40.0000 mg | ORAL_TABLET | Freq: Every day | ORAL | Status: DC

## 2011-09-07 ENCOUNTER — Ambulatory Visit: Payer: Medicare Other | Admitting: Cardiology

## 2011-09-07 LAB — POCT I-STAT 3, ART BLOOD GAS (G3+)
Acid-base deficit: 1 mmol/L (ref 0.0–2.0)
Bicarbonate: 22.9 mEq/L (ref 20.0–24.0)
Bicarbonate: 22.9 mEq/L (ref 20.0–24.0)
Bicarbonate: 25.5 mEq/L — ABNORMAL HIGH (ref 20.0–24.0)
O2 Saturation: 100 %
O2 Saturation: 100 %
O2 Saturation: 99 %
Patient temperature: 35.5
Patient temperature: 37.1
TCO2: 24 mmol/L (ref 0–100)
TCO2: 24 mmol/L (ref 0–100)
TCO2: 26 mmol/L (ref 0–100)
TCO2: 27 mmol/L (ref 0–100)
pCO2 arterial: 38.4 mmHg (ref 35.0–45.0)
pCO2 arterial: 39.1 mmHg (ref 35.0–45.0)
pCO2 arterial: 42.9 mmHg (ref 35.0–45.0)
pH, Arterial: 7.383 (ref 7.350–7.400)
pH, Arterial: 7.414 — ABNORMAL HIGH (ref 7.350–7.400)
pH, Arterial: 7.422 — ABNORMAL HIGH (ref 7.350–7.400)
pH, Arterial: 7.442 — ABNORMAL HIGH (ref 7.350–7.400)
pO2, Arterial: 118 mmHg — ABNORMAL HIGH (ref 80.0–100.0)
pO2, Arterial: 140 mmHg — ABNORMAL HIGH (ref 80.0–100.0)

## 2011-09-07 LAB — CROSSMATCH
ABO/RH(D): A NEG
Antibody Screen: NEGATIVE

## 2011-09-07 LAB — PREPARE RBC (CROSSMATCH)

## 2011-09-07 LAB — CBC
HCT: 23.9 % — ABNORMAL LOW (ref 36.0–46.0)
HCT: 24.5 % — ABNORMAL LOW (ref 36.0–46.0)
HCT: 37 % (ref 36.0–46.0)
HCT: 38.4 % (ref 36.0–46.0)
HCT: 40.1 % (ref 36.0–46.0)
Hemoglobin: 11.9 g/dL — ABNORMAL LOW (ref 12.0–15.0)
Hemoglobin: 12.7 g/dL (ref 12.0–15.0)
Hemoglobin: 12.9 g/dL (ref 12.0–15.0)
Hemoglobin: 13.6 g/dL (ref 12.0–15.0)
Hemoglobin: 8.5 g/dL — ABNORMAL LOW (ref 12.0–15.0)
Hemoglobin: 8.6 g/dL — ABNORMAL LOW (ref 12.0–15.0)
MCHC: 34.8 g/dL (ref 30.0–36.0)
MCHC: 35 g/dL (ref 30.0–36.0)
MCHC: 35 g/dL (ref 30.0–36.0)
MCHC: 35.5 g/dL (ref 30.0–36.0)
MCHC: 35.7 g/dL (ref 30.0–36.0)
MCV: 86.1 fL (ref 78.0–100.0)
MCV: 86.1 fL (ref 78.0–100.0)
MCV: 86.9 fL (ref 78.0–100.0)
MCV: 87.5 fL (ref 78.0–100.0)
MCV: 87.8 fL (ref 78.0–100.0)
Platelets: 122 10*3/uL — ABNORMAL LOW (ref 150–400)
Platelets: 129 10*3/uL — ABNORMAL LOW (ref 150–400)
Platelets: 130 10*3/uL — ABNORMAL LOW (ref 150–400)
Platelets: 82 10*3/uL — ABNORMAL LOW (ref 150–400)
Platelets: 97 10*3/uL — ABNORMAL LOW (ref 150–400)
RBC: 2.77 MIL/uL — ABNORMAL LOW (ref 3.87–5.11)
RBC: 2.82 MIL/uL — ABNORMAL LOW (ref 3.87–5.11)
RBC: 3.86 MIL/uL — ABNORMAL LOW (ref 3.87–5.11)
RBC: 4.21 MIL/uL (ref 3.87–5.11)
RBC: 4.26 MIL/uL (ref 3.87–5.11)
RBC: 4.37 MIL/uL (ref 3.87–5.11)
RDW: 13.4 % (ref 11.5–15.5)
RDW: 13.5 % (ref 11.5–15.5)
RDW: 13.6 % (ref 11.5–15.5)
RDW: 14.3 % (ref 11.5–15.5)
RDW: 14.4 % (ref 11.5–15.5)
WBC: 4.9 10*3/uL (ref 4.0–10.5)
WBC: 5.4 10*3/uL (ref 4.0–10.5)
WBC: 5.5 10*3/uL (ref 4.0–10.5)
WBC: 5.6 10*3/uL (ref 4.0–10.5)
WBC: 5.8 10*3/uL (ref 4.0–10.5)
WBC: 6.1 10*3/uL (ref 4.0–10.5)
WBC: 6.7 10*3/uL (ref 4.0–10.5)

## 2011-09-07 LAB — URINALYSIS, ROUTINE W REFLEX MICROSCOPIC
Bilirubin Urine: NEGATIVE
Ketones, ur: NEGATIVE mg/dL
Nitrite: NEGATIVE
Protein, ur: NEGATIVE mg/dL
Urobilinogen, UA: 0.2 mg/dL (ref 0.0–1.0)

## 2011-09-07 LAB — BASIC METABOLIC PANEL
BUN: 11 mg/dL (ref 6–23)
BUN: 13 mg/dL (ref 6–23)
BUN: 14 mg/dL (ref 6–23)
BUN: 9 mg/dL (ref 6–23)
CO2: 22 mEq/L (ref 19–32)
CO2: 23 mEq/L (ref 19–32)
Calcium: 8.8 mg/dL (ref 8.4–10.5)
Calcium: 9.2 mg/dL (ref 8.4–10.5)
Chloride: 107 mEq/L (ref 96–112)
Chloride: 108 mEq/L (ref 96–112)
Chloride: 109 mEq/L (ref 96–112)
Chloride: 110 mEq/L (ref 96–112)
Chloride: 111 mEq/L (ref 96–112)
Creatinine, Ser: 0.77 mg/dL (ref 0.4–1.2)
Creatinine, Ser: 0.88 mg/dL (ref 0.4–1.2)
GFR calc Af Amer: >60 mL/min (ref >60)
GFR calc Af Amer: >60 mL/min (ref >60)
GFR calc Af Amer: >60 mL/min (ref >60)
GFR calc Af Amer: >60 mL/min (ref >60)
GFR calc non Af Amer: >60 mL/min (ref >60)
GFR calc non Af Amer: >60 mL/min (ref >60)
GFR calc non Af Amer: >60 mL/min (ref >60)
Glucose, Bld: 112 mg/dL — ABNORMAL HIGH (ref 70–99)
Glucose, Bld: 119 mg/dL — ABNORMAL HIGH (ref 70–99)
Glucose, Bld: 170 mg/dL — ABNORMAL HIGH (ref 70–99)
Potassium: 3.6 mEq/L (ref 3.5–5.1)
Potassium: 3.8 mEq/L (ref 3.5–5.1)
Potassium: 3.8 mEq/L (ref 3.5–5.1)
Potassium: 3.9 mEq/L (ref 3.5–5.1)
Sodium: 139 mEq/L (ref 135–145)
Sodium: 139 mEq/L (ref 135–145)
Sodium: 141 mEq/L (ref 135–145)

## 2011-09-07 LAB — POCT I-STAT 4, (NA,K, GLUC, HGB,HCT)
Glucose, Bld: 112 mg/dL — ABNORMAL HIGH (ref 70–99)
Glucose, Bld: 119 mg/dL — ABNORMAL HIGH (ref 70–99)
Glucose, Bld: 121 mg/dL — ABNORMAL HIGH (ref 70–99)
Glucose, Bld: 137 mg/dL — ABNORMAL HIGH (ref 70–99)
HCT: 20 % — ABNORMAL LOW (ref 36.0–46.0)
HCT: 22 % — ABNORMAL LOW (ref 36.0–46.0)
HCT: 27 % — ABNORMAL LOW (ref 36.0–46.0)
Hemoglobin: 8.8 g/dL — ABNORMAL LOW (ref 12.0–15.0)
Hemoglobin: 9.2 g/dL — ABNORMAL LOW (ref 12.0–15.0)
Potassium: 3.3 mEq/L — ABNORMAL LOW (ref 3.5–5.1)
Potassium: 4 mEq/L (ref 3.5–5.1)
Potassium: 5.2 mEq/L — ABNORMAL HIGH (ref 3.5–5.1)
Potassium: 6.1 mEq/L — ABNORMAL HIGH (ref 3.5–5.1)
Sodium: 128 mEq/L — ABNORMAL LOW (ref 135–145)
Sodium: 130 mEq/L — ABNORMAL LOW (ref 135–145)
Sodium: 137 mEq/L (ref 135–145)
Sodium: 138 mEq/L (ref 135–145)
Sodium: 142 mEq/L (ref 135–145)

## 2011-09-07 LAB — COMPREHENSIVE METABOLIC PANEL
AST: 15 U/L (ref 0–37)
Albumin: 3.4 g/dL — ABNORMAL LOW (ref 3.5–5.2)
BUN: 13 mg/dL (ref 6–23)
Chloride: 111 mEq/L (ref 96–112)
Creatinine, Ser: 0.89 mg/dL (ref 0.4–1.2)
GFR calc Af Amer: >60 mL/min (ref >60)
Potassium: 4.2 mEq/L (ref 3.5–5.1)
Total Bilirubin: 0.7 mg/dL (ref 0.3–1.2)
Total Protein: 5.6 g/dL — ABNORMAL LOW (ref 6.0–8.3)

## 2011-09-07 LAB — CREATININE, SERUM
Creatinine, Ser: 0.83 mg/dL (ref 0.4–1.2)
GFR calc Af Amer: >60 mL/min (ref >60)
GFR calc non Af Amer: >60 mL/min (ref >60)

## 2011-09-07 LAB — BLOOD GAS, ARTERIAL
Bicarbonate: 21.3 mEq/L (ref 20.0–24.0)
FIO2: 0.21 %
O2 Saturation: 98.4 %
pCO2 arterial: 32.6 mmHg — ABNORMAL LOW (ref 35.0–45.0)
pO2, Arterial: 97.9 mmHg (ref 80.0–100.0)

## 2011-09-07 LAB — PROTIME-INR
INR: 1 (ref 0.00–1.49)
INR: 1 (ref 0.00–1.49)
INR: 1.4 (ref 0.00–1.49)
Prothrombin Time: 17.7 seconds — ABNORMAL HIGH (ref 11.6–15.2)

## 2011-09-07 LAB — CARDIAC PANEL(CRET KIN+CKTOT+MB+TROPI)
CK, MB: 0.8 ng/mL (ref 0.3–4.0)
CK, MB: 0.8 ng/mL (ref 0.3–4.0)
Relative Index: INVALID (ref 0.0–2.5)
Total CK: 34 U/L (ref 7–177)
Total CK: 34 U/L (ref 7–177)
Troponin I: 0.01 ng/mL (ref 0.00–0.06)

## 2011-09-07 LAB — POCT I-STAT 3, VENOUS BLOOD GAS (G3P V)
Acid-base deficit: 3 mmol/L — ABNORMAL HIGH (ref 0.0–2.0)
O2 Saturation: 82 %
TCO2: 24 mmol/L (ref 0–100)
pH, Ven: 7.323 — ABNORMAL HIGH (ref 7.250–7.300)

## 2011-09-07 LAB — LIPID PANEL
Triglycerides: 131 mg/dL (ref <150)
VLDL: 26 mg/dL (ref 0–40)

## 2011-09-07 LAB — PREPARE PLATELET PHERESIS

## 2011-09-07 LAB — GLUCOSE, CAPILLARY
Glucose-Capillary: 138 mg/dL — ABNORMAL HIGH (ref 70–99)
Glucose-Capillary: 159 mg/dL — ABNORMAL HIGH (ref 70–99)
Glucose-Capillary: 170 mg/dL — ABNORMAL HIGH (ref 70–99)
Glucose-Capillary: 99 mg/dL (ref 70–99)

## 2011-09-07 LAB — MAGNESIUM: Magnesium: 3.4 mg/dL — ABNORMAL HIGH (ref 1.5–2.5)

## 2011-09-07 LAB — HEPARIN LEVEL (UNFRACTIONATED): Heparin Unfractionated: <0.1 IU/mL — ABNORMAL LOW (ref 0.30–0.70)

## 2011-09-07 LAB — POCT I-STAT, CHEM 8
BUN: 9 mg/dL (ref 6–23)
Chloride: 109 mEq/L (ref 96–112)
Creatinine, Ser: 0.9 mg/dL (ref 0.4–1.2)
Potassium: 4.3 mEq/L (ref 3.5–5.1)
Sodium: 144 mEq/L (ref 135–145)

## 2011-09-07 LAB — PLATELET COUNT: Platelets: 88 10*3/uL — ABNORMAL LOW (ref 150–400)

## 2011-09-07 LAB — PREPARE FRESH FROZEN PLASMA

## 2011-09-07 LAB — HEMOGLOBIN A1C: Mean Plasma Glucose: 111 mg/dL

## 2011-09-07 LAB — APTT
aPTT: 41 seconds — ABNORMAL HIGH (ref 24–37)
aPTT: 83 seconds — ABNORMAL HIGH (ref 24–37)

## 2011-09-07 LAB — ABO/RH: ABO/RH(D): A NEG

## 2011-09-09 LAB — POCT I-STAT, CHEM 8
BUN: 10 mg/dL (ref 6–23)
Chloride: 105 mEq/L (ref 96–112)
Potassium: 3.7 mEq/L (ref 3.5–5.1)
Sodium: 141 mEq/L (ref 135–145)
TCO2: 25 mmol/L (ref 0–100)

## 2011-09-09 LAB — CBC
HCT: 24.6 % — ABNORMAL LOW (ref 36.0–46.0)
HCT: 25 % — ABNORMAL LOW (ref 36.0–46.0)
HCT: 27.7 % — ABNORMAL LOW (ref 36.0–46.0)
HCT: 27.9 % — ABNORMAL LOW (ref 36.0–46.0)
HCT: 30 % — ABNORMAL LOW (ref 36.0–46.0)
Hemoglobin: 10.3 g/dL — ABNORMAL LOW (ref 12.0–15.0)
Hemoglobin: 8.9 g/dL — ABNORMAL LOW (ref 12.0–15.0)
Hemoglobin: 9 g/dL — ABNORMAL LOW (ref 12.0–15.0)
Hemoglobin: 9.6 g/dL — ABNORMAL LOW (ref 12.0–15.0)
Hemoglobin: 9.6 g/dL — ABNORMAL LOW (ref 12.0–15.0)
MCHC: 34.2 g/dL (ref 30.0–36.0)
MCHC: 34.3 g/dL (ref 30.0–36.0)
MCHC: 34.5 g/dL (ref 30.0–36.0)
MCHC: 35.4 g/dL (ref 30.0–36.0)
MCHC: 36.8 g/dL — ABNORMAL HIGH (ref 30.0–36.0)
MCV: 87.8 fL (ref 78.0–100.0)
MCV: 87.8 fL (ref 78.0–100.0)
MCV: 88.7 fL (ref 78.0–100.0)
MCV: 88.9 fL (ref 78.0–100.0)
MCV: 89.1 fL (ref 78.0–100.0)
Platelets: 113 10*3/uL — ABNORMAL LOW (ref 150–400)
Platelets: 118 10*3/uL — ABNORMAL LOW (ref 150–400)
Platelets: 82 10*3/uL — ABNORMAL LOW (ref 150–400)
Platelets: 90 10*3/uL — ABNORMAL LOW (ref 150–400)
Platelets: 96 10*3/uL — ABNORMAL LOW (ref 150–400)
RBC: 2.8 MIL/uL — ABNORMAL LOW (ref 3.87–5.11)
RBC: 2.85 MIL/uL — ABNORMAL LOW (ref 3.87–5.11)
RBC: 3.12 MIL/uL — ABNORMAL LOW (ref 3.87–5.11)
RBC: 3.14 MIL/uL — ABNORMAL LOW (ref 3.87–5.11)
RBC: 3.38 MIL/uL — ABNORMAL LOW (ref 3.87–5.11)
RDW: 14.5 % (ref 11.5–15.5)
RDW: 14.6 % (ref 11.5–15.5)
RDW: 14.6 % (ref 11.5–15.5)
RDW: 14.8 % (ref 11.5–15.5)
RDW: 15.1 % (ref 11.5–15.5)
WBC: 10.2 10*3/uL (ref 4.0–10.5)
WBC: 5 10*3/uL (ref 4.0–10.5)
WBC: 5.3 10*3/uL (ref 4.0–10.5)
WBC: 5.6 10*3/uL (ref 4.0–10.5)
WBC: 7.2 10*3/uL (ref 4.0–10.5)

## 2011-09-09 LAB — BASIC METABOLIC PANEL
BUN: 11 mg/dL (ref 6–23)
BUN: 11 mg/dL (ref 6–23)
BUN: 14 mg/dL (ref 6–23)
BUN: 8 mg/dL (ref 6–23)
CO2: 25 mEq/L (ref 19–32)
CO2: 27 mEq/L (ref 19–32)
CO2: 28 mEq/L (ref 19–32)
CO2: 28 mEq/L (ref 19–32)
Calcium: 8.1 mg/dL — ABNORMAL LOW (ref 8.4–10.5)
Calcium: 8.2 mg/dL — ABNORMAL LOW (ref 8.4–10.5)
Calcium: 8.6 mg/dL (ref 8.4–10.5)
Calcium: 8.9 mg/dL (ref 8.4–10.5)
Chloride: 102 mEq/L (ref 96–112)
Chloride: 104 mEq/L (ref 96–112)
Chloride: 104 mEq/L (ref 96–112)
Chloride: 112 mEq/L (ref 96–112)
Creatinine, Ser: 0.75 mg/dL (ref 0.4–1.2)
Creatinine, Ser: 0.86 mg/dL (ref 0.4–1.2)
Creatinine, Ser: 0.87 mg/dL (ref 0.4–1.2)
Creatinine, Ser: 0.93 mg/dL (ref 0.4–1.2)
GFR calc Af Amer: >60 mL/min (ref >60)
GFR calc Af Amer: >60 mL/min (ref >60)
GFR calc Af Amer: >60 mL/min (ref >60)
GFR calc Af Amer: >60 mL/min (ref >60)
GFR calc non Af Amer: 59 mL/min — ABNORMAL LOW (ref >60)
GFR calc non Af Amer: >60 mL/min (ref >60)
GFR calc non Af Amer: >60 mL/min (ref >60)
GFR calc non Af Amer: >60 mL/min (ref >60)
Glucose, Bld: 114 mg/dL — ABNORMAL HIGH (ref 70–99)
Glucose, Bld: 118 mg/dL — ABNORMAL HIGH (ref 70–99)
Glucose, Bld: 124 mg/dL — ABNORMAL HIGH (ref 70–99)
Glucose, Bld: 160 mg/dL — ABNORMAL HIGH (ref 70–99)
Potassium: 3.8 mEq/L (ref 3.5–5.1)
Potassium: 4 mEq/L (ref 3.5–5.1)
Potassium: 4.3 mEq/L (ref 3.5–5.1)
Potassium: 4.6 mEq/L (ref 3.5–5.1)
Sodium: 136 mEq/L (ref 135–145)
Sodium: 138 mEq/L (ref 135–145)
Sodium: 139 mEq/L (ref 135–145)
Sodium: 144 mEq/L (ref 135–145)

## 2011-09-09 LAB — GLUCOSE, CAPILLARY
Glucose-Capillary: 104 mg/dL — ABNORMAL HIGH (ref 70–99)
Glucose-Capillary: 113 mg/dL — ABNORMAL HIGH (ref 70–99)
Glucose-Capillary: 113 mg/dL — ABNORMAL HIGH (ref 70–99)
Glucose-Capillary: 113 mg/dL — ABNORMAL HIGH (ref 70–99)
Glucose-Capillary: 115 mg/dL — ABNORMAL HIGH (ref 70–99)
Glucose-Capillary: 132 mg/dL — ABNORMAL HIGH (ref 70–99)
Glucose-Capillary: 143 mg/dL — ABNORMAL HIGH (ref 70–99)
Glucose-Capillary: 159 mg/dL — ABNORMAL HIGH (ref 70–99)
Glucose-Capillary: 160 mg/dL — ABNORMAL HIGH (ref 70–99)

## 2011-09-09 LAB — CREATININE, SERUM
Creatinine, Ser: 0.94 mg/dL (ref 0.4–1.2)
GFR calc Af Amer: >60 mL/min (ref >60)
GFR calc non Af Amer: 59 mL/min — ABNORMAL LOW (ref >60)

## 2011-09-09 LAB — POTASSIUM: Potassium: 3.7 mEq/L (ref 3.5–5.1)

## 2011-09-09 LAB — MAGNESIUM
Magnesium: 2.5 mg/dL (ref 1.5–2.5)
Magnesium: 2.9 mg/dL — ABNORMAL HIGH (ref 1.5–2.5)

## 2011-10-08 ENCOUNTER — Encounter: Payer: Self-pay | Admitting: Cardiology

## 2011-10-08 ENCOUNTER — Ambulatory Visit (INDEPENDENT_AMBULATORY_CARE_PROVIDER_SITE_OTHER): Payer: Medicare Other | Admitting: Cardiology

## 2011-10-08 DIAGNOSIS — E7800 Pure hypercholesterolemia, unspecified: Secondary | ICD-10-CM

## 2011-10-08 DIAGNOSIS — I10 Essential (primary) hypertension: Secondary | ICD-10-CM

## 2011-10-08 DIAGNOSIS — I2510 Atherosclerotic heart disease of native coronary artery without angina pectoris: Secondary | ICD-10-CM

## 2011-10-08 LAB — BASIC METABOLIC PANEL
BUN: 17 mg/dL (ref 6–23)
CO2: 25 mEq/L (ref 19–32)
Calcium: 8.8 mg/dL (ref 8.4–10.5)
Glucose, Bld: 109 mg/dL — ABNORMAL HIGH (ref 70–99)
Sodium: 139 mEq/L (ref 135–145)

## 2011-10-08 NOTE — Assessment & Plan Note (Signed)
Controlled.  Will check a BMET and reassess since she is on HCTZ.

## 2011-10-08 NOTE — Progress Notes (Signed)
HPI:  [name] Graham has some shortness of breath with exertion accompanied by chest tightness.  It is predictable with long distances as well as fast speed.  Otherwise she is ok.  She does not want to proceed.  She had a nuc study at [facility], and echo.  We have the echo report, but not the nuc.  She did not see back in the [facility] clinic after the studies, so is unaware of the findings.  I have reviewed with her the echo report.   Current Outpatient Prescriptions  Medication Sig Dispense Refill  . aspirin 81 MG tablet Take 81 mg by mouth daily.        . Calcium Carbonate-Vit D-Min 600-400 MG-UNIT TABS Take 1 tablet by mouth daily.        . hydrochlorothiazide (HYDRODIURIL) 12.5 MG tablet Take 12.5 mg by mouth daily.        . isosorbide mononitrate (IMDUR) 120 MG 24 hr tablet Take 1 tablet (120 mg total) by mouth every morning.  30 tablet  6  . metoprolol (LOPRESSOR) 50 MG tablet Take 50 mg by mouth 2 (two) times daily.        . Multiple Vitamins-Minerals (MULTIVITAMIN WITH MINERALS) tablet Take 1 tablet by mouth daily.        . nitroGLYCERIN (NITROSTAT) 0.4 MG SL tablet Place 1 tablet (0.4 mg total) under the tongue every 5 (five) minutes as needed.  25 tablet  3  . simvastatin (ZOCOR) 40 MG tablet Take 1 tablet (40 mg total) by mouth at bedtime.  30 tablet  6    No Known Allergies  Past Medical History  Diagnosis Date  . Exertional angina   . Coronary artery disease     single vessel of the LAD  . Normal echocardiogram     normal lv function  . Bundle branch block   . Lung nodule     lower  . Anemia     mild normocytic anemia  . Thrombocytopenia     platelet count 80,000  . Anxiety   . GERD (gastroesophageal reflux disease)     Past Surgical History  Procedure Date  . Coronary artery bypass graft 11/04/2008      Kerin Perna, M.D.    Family History  Problem Relation Age of Onset  . Heart failure Mother   . Heart failure Father     History   Social History  . Marital  Status: Married    Spouse Name: N/A    Number of Children: N/A  . Years of Education: N/A   Occupational History  . RETIRED    Social History Main Topics  . Smoking status: Never Smoker   . Smokeless tobacco: Not on file  . Alcohol Use: No  . Drug Use: No  . Sexually Active: Not on file   Other Topics Concern  . Not on file   Social History Narrative  . No narrative on file    ROS: Please see the HPI.  All other systems reviewed and negative.  PHYSICAL EXAM:  BP 110/64  Pulse 56  Resp 18  Ht 5\' 4"  (1.626 m)  Wt 122 lb (55.339 kg)  BMI 20.94 kg/m2  General: Well developed, well nourished, in no acute distress. Head:  Normocephalic and atraumatic. Neck: no JVD Lungs: Clear to auscultation and percussion. Heart: Normal S1 and S2.  Paradoxical S2 gallop.  Minimal SEM.  No DM.  PMI non displaced.   Abdomen:  Normal bowel sounds; soft;  non tender; no organomegaly Pulses: Pulses normal in all 4 extremities. Extremities: No clubbing or cyanosis. No edema. Neurologic: Alert and oriented x 3.  EKG:  NSR.  LBBB.   ASSESSMENT AND PLAN:

## 2011-10-08 NOTE — Assessment & Plan Note (Signed)
This is checked in Dr. Sherril Croon office.

## 2011-10-08 NOTE — Assessment & Plan Note (Signed)
Symptoms are some worrisome for recurrent ischemia, or could be related to pul HTN.  I have asked her to consider a R and L heart cath, but she is reluctant and would like to watch this for now.  She will see me back in six weeks and we will reassess.  Will not change meds.

## 2011-10-08 NOTE — Patient Instructions (Signed)
Your physician recommends that you schedule a follow-up appointment in: 6 weeks with Dr. Riley Kill. Your physician recommends that you return for lab work in: BMET today.

## 2011-11-23 ENCOUNTER — Encounter: Payer: Self-pay | Admitting: Cardiology

## 2011-11-23 ENCOUNTER — Ambulatory Visit (INDEPENDENT_AMBULATORY_CARE_PROVIDER_SITE_OTHER): Payer: Medicare Other | Admitting: Cardiology

## 2011-11-23 VITALS — BP 126/76 | HR 68 | Ht 64.0 in | Wt 122.1 lb

## 2011-11-23 DIAGNOSIS — I2510 Atherosclerotic heart disease of native coronary artery without angina pectoris: Secondary | ICD-10-CM

## 2011-11-23 DIAGNOSIS — E785 Hyperlipidemia, unspecified: Secondary | ICD-10-CM

## 2011-11-23 DIAGNOSIS — J984 Other disorders of lung: Secondary | ICD-10-CM

## 2011-11-23 DIAGNOSIS — R079 Chest pain, unspecified: Secondary | ICD-10-CM

## 2011-11-23 DIAGNOSIS — I38 Endocarditis, valve unspecified: Secondary | ICD-10-CM

## 2011-11-23 MED ORDER — OMEPRAZOLE 40 MG PO CPDR: 40.0000 mg | DELAYED_RELEASE_CAPSULE | Freq: Every day | ORAL | Status: DC

## 2011-11-23 MED ORDER — NITROGLYCERIN 0.4 MG SL SUBL: 0.4000 mg | SUBLINGUAL_TABLET | SUBLINGUAL | Status: DC | PRN

## 2011-11-23 MED ORDER — HYDROCHLOROTHIAZIDE 12.5 MG PO TABS: 12.5000 mg | ORAL_TABLET | Freq: Every day | ORAL | Status: DC

## 2011-11-23 NOTE — Patient Instructions (Signed)
Your physician recommends that you continue on your current medications as directed. Please refer to the Current Medication list given to you today.  Your physician recommends that you schedule a follow-up appointment in: January 2013

## 2011-12-05 NOTE — Assessment & Plan Note (Signed)
See data under exertional chest pain.  She will need to be followed.  Class II symptoms currently stable without progression from recent visit.  She wants to hold off on further cath.

## 2011-12-05 NOTE — Assessment & Plan Note (Addendum)
My concerns are that this is related to likely progression or graft closure.  She has had stable class II symptoms, and she has not really favored a re-look angio to assess her coronary and graft status. She would like at the present time to continue a medical approach to treatment, and I will continue to follow her on a regular basis to reevaluate her current situation.  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  I cannot appreciate sig MR on exam.  Continue to observe as noted,and follow up.

## 2011-12-05 NOTE — Assessment & Plan Note (Signed)
Has not had checks in some time.  Probably needs  Lipid and liver profile  She appears to still be on simvastatin.

## 2011-12-05 NOTE — Assessment & Plan Note (Signed)
See echo report from 2012.  I cannot appreciate a def murmur at this time.  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  Not sure if this is source of angina, but inclined to doubt.  Will recheck echo in follow up sometime in mid 2013.

## 2011-12-05 NOTE — Assessment & Plan Note (Signed)
Last CXR 2011 Dr. Andee Lineman unchanged.

## 2011-12-05 NOTE — Progress Notes (Signed)
HPI:  Mrs. Wile is in for follow up.  She has remained stable, and has had virtually no change in her overall status.  She denies any chest discomfort at rest.  It is related primarly to up hill or stair related exertion.  She does say that it is predictable.  She has not been awakened with it.    Current Outpatient Prescriptions  Medication Sig Dispense Refill  . aspirin 81 MG tablet Take 81 mg by mouth daily.       . Calcium Carbonate-Vit D-Min 600-400 MG-UNIT TABS Take 1 tablet by mouth daily.        . hydrochlorothiazide (HYDRODIURIL) 12.5 MG tablet Take 1 tablet (12.5 mg total) by mouth daily.  90 tablet  3  . isosorbide mononitrate (IMDUR) 120 MG 24 hr tablet Take 1 tablet (120 mg total) by mouth every morning.  30 tablet  6  . metoprolol (LOPRESSOR) 50 MG tablet Take 50 mg by mouth 2 (two) times daily.        . Multiple Vitamins-Minerals (MULTIVITAMIN WITH MINERALS) tablet Take 1 tablet by mouth daily.        . nitroGLYCERIN (NITROSTAT) 0.4 MG SL tablet Place 1 tablet (0.4 mg total) under the tongue every 5 (five) minutes as needed.  25 tablet  3  . omeprazole (PRILOSEC) 40 MG capsule Take 1 capsule (40 mg total) by mouth daily.  90 capsule  3  . simvastatin (ZOCOR) 40 MG tablet Take 1 tablet (40 mg total) by mouth at bedtime.  30 tablet  6    No Known Allergies  Past Medical History  Diagnosis Date  . Exertional angina   . Coronary artery disease     single vessel of the LAD  . Normal echocardiogram     normal lv function  . Bundle branch block   . Lung nodule     lower  . Anemia     mild normocytic anemia  . Thrombocytopenia     platelet count 80,000  . Anxiety   . GERD (gastroesophageal reflux disease)     Past Surgical History  Procedure Date  . Coronary artery bypass graft 11/04/2008      Kerin Perna, M.D.    Family History  Problem Relation Age of Onset  . Heart failure Mother   . Heart failure Father     History   Social History  . Marital  Status: Married    Spouse Name: N/A    Number of Children: N/A  . Years of Education: N/A   Occupational History  . RETIRED    Social History Main Topics  . Smoking status: Never Smoker   . Smokeless tobacco: Not on file  . Alcohol Use: No  . Drug Use: No  . Sexually Active: Not on file   Other Topics Concern  . Not on file   Social History Narrative  . No narrative on file    ROS: Please see the HPI.  All other systems reviewed and negative.  PHYSICAL EXAM:  BP 126/76  Pulse 68  Ht 5\' 4"  (1.626 m)  Wt 55.393 kg (122 lb 1.9 oz)  BMI 20.96 kg/m2  General: Well developed, well nourished, in no acute distress. Head:  Normocephalic and atraumatic. Neck: no JVD Lungs: Clear to auscultation and percussion. Heart: Normal S1 and S2.  No murmur, rubs or gallops.  No def apical murmur, or displacement of PMI noted.  Pulses: Pulses normal in all 4 extremities. Extremities:  No clubbing or cyanosis. No edema. Neurologic: Alert and oriented x 3.  EKG:  ASSESSMENT AND PLAN:

## 2011-12-06 ENCOUNTER — Telehealth: Payer: Self-pay

## 2011-12-06 NOTE — Telephone Encounter (Signed)
Lauren Can you tell her she needs a CBC, fasting lipid and liver, and BMET. These could all be important. Please also confirm she is taking simva and not lipitor. I reveiwed Gene Serpe's last note. Thanks. TS   The pt has an upcoming appointment 12/14/11 with Dr Riley Kill.  I attempted to reach the pt but there was no answer. The pt can come into that appointment fasting for lab work.  I also need to clarify the pt's statin.

## 2011-12-10 NOTE — Telephone Encounter (Signed)
I attempted to reach the pt but there was no answer.  The pt has an appt on 12/14/11 and we can schedule labs from that appointment and discuss cholesterol medication at that time.

## 2011-12-14 ENCOUNTER — Ambulatory Visit: Payer: Medicare Other | Admitting: Cardiology

## 2012-01-18 ENCOUNTER — Other Ambulatory Visit: Payer: Self-pay | Admitting: *Deleted

## 2012-01-18 DIAGNOSIS — I2510 Atherosclerotic heart disease of native coronary artery without angina pectoris: Secondary | ICD-10-CM

## 2012-01-18 MED ORDER — ISOSORBIDE MONONITRATE ER 120 MG PO TB24: 120.0000 mg | ORAL_TABLET | ORAL | Status: DC

## 2012-01-25 DIAGNOSIS — R079 Chest pain, unspecified: Secondary | ICD-10-CM

## 2012-01-26 DIAGNOSIS — I2510 Atherosclerotic heart disease of native coronary artery without angina pectoris: Secondary | ICD-10-CM

## 2012-01-28 ENCOUNTER — Ambulatory Visit: Payer: Medicare Other | Admitting: Cardiology

## 2012-03-02 ENCOUNTER — Ambulatory Visit: Payer: Medicare Other | Admitting: Cardiology

## 2012-03-25 ENCOUNTER — Other Ambulatory Visit: Payer: Self-pay | Admitting: Cardiology

## 2012-03-27 NOTE — Telephone Encounter (Signed)
[address] patient 

## 2012-03-28 NOTE — Telephone Encounter (Signed)
[address] 

## 2012-03-31 NOTE — Telephone Encounter (Signed)
[address] 

## 2012-04-03 ENCOUNTER — Other Ambulatory Visit: Payer: Self-pay | Admitting: Cardiology

## 2012-04-03 MED ORDER — METOPROLOL TARTRATE 50 MG PO TABS: 50.0000 mg | ORAL_TABLET | Freq: Two times a day (BID) | ORAL | Status: DC

## 2012-04-10 NOTE — Telephone Encounter (Signed)
[address] PATIENT 

## 2012-04-10 NOTE — Telephone Encounter (Signed)
No medication refill requested

## 2012-04-21 ENCOUNTER — Encounter: Payer: Self-pay | Admitting: Cardiology

## 2012-04-21 ENCOUNTER — Ambulatory Visit (INDEPENDENT_AMBULATORY_CARE_PROVIDER_SITE_OTHER): Payer: Commercial Managed Care - PPO | Admitting: Cardiology

## 2012-04-21 VITALS — BP 110/64 | HR 59 | Ht 64.0 in | Wt 125.1 lb

## 2012-04-21 DIAGNOSIS — I340 Nonrheumatic mitral (valve) insufficiency: Secondary | ICD-10-CM

## 2012-04-21 DIAGNOSIS — I059 Rheumatic mitral valve disease, unspecified: Secondary | ICD-10-CM

## 2012-04-21 DIAGNOSIS — I2510 Atherosclerotic heart disease of native coronary artery without angina pectoris: Secondary | ICD-10-CM

## 2012-04-21 DIAGNOSIS — E7800 Pure hypercholesterolemia, unspecified: Secondary | ICD-10-CM

## 2012-04-21 MED ORDER — HYDROCHLOROTHIAZIDE 12.5 MG PO TABS: 12.5000 mg | ORAL_TABLET | Freq: Every day | ORAL | Status: DC

## 2012-04-21 NOTE — Patient Instructions (Addendum)
Your physician wants you to follow-up in: 6 MONTHS. You will receive a reminder letter in the mail two months in advance. If you don't receive a letter, please call our office to schedule the follow-up appointment.  Your physician recommends that you continue on your current medications as directed. Please refer to the Current Medication list given to you today.  Your physician recommends that you have lab work today: LIPID, LIVER, CBC and BMP  (the lab stuck the pt 3 times today and could not get specimen--the pt will come back next week for lab)

## 2012-04-21 NOTE — Progress Notes (Signed)
   HPI:  The patient is doing better. She still has some discomfort when she Exerts herself. Overall she is pleased with how she is doing she says she is absolutely no worse than she was on the last visit.  Current Outpatient Prescriptions  Medication Sig Dispense Refill  . aspirin 81 MG tablet Take 81 mg by mouth daily.       . Calcium Carbonate-Vit D-Min 600-400 MG-UNIT TABS Take 1 tablet by mouth daily.        . hydrochlorothiazide (HYDRODIURIL) 12.5 MG tablet Take 1 tablet (12.5 mg total) by mouth daily.  90 tablet  3  . isosorbide mononitrate (IMDUR) 120 MG 24 hr tablet Take 1 tablet (120 mg total) by mouth every morning.  30 tablet  6  . metoprolol (LOPRESSOR) 50 MG tablet Take 1 tablet (50 mg total) by mouth 2 (two) times daily.  60 tablet  8  . Multiple Vitamins-Minerals (MULTIVITAMIN WITH MINERALS) tablet Take 1 tablet by mouth daily.        . nitroGLYCERIN (NITROSTAT) 0.4 MG SL tablet Place 1 tablet (0.4 mg total) under the tongue every 5 (five) minutes as needed.  25 tablet  3  . omeprazole (PRILOSEC) 40 MG capsule Take 1 capsule (40 mg total) by mouth daily.  90 capsule  3  . simvastatin (ZOCOR) 40 MG tablet Take 1 tablet (40 mg total) by mouth at bedtime.  30 tablet  6    No Known Allergies  Past Medical History  Diagnosis Date  . Exertional angina   . Coronary artery disease     single vessel of the LAD  . Normal echocardiogram     normal lv function  . Bundle branch block   . Lung nodule     lower  . Anemia     mild normocytic anemia  . Thrombocytopenia     platelet count 80,000  . Anxiety   . GERD (gastroesophageal reflux disease)     Past Surgical History  Procedure Date  . Coronary artery bypass graft 11/04/2008      Kerin Perna, M.D.    Family History  Problem Relation Age of Onset  . Heart failure Mother   . Heart failure Father     History   Social History  . Marital Status: Married    Spouse Name: N/A    Number of Children: N/A  . Years  of Education: N/A   Occupational History  . RETIRED    Social History Main Topics  . Smoking status: Never Smoker   . Smokeless tobacco: Not on file  . Alcohol Use: No  . Drug Use: No  . Sexually Active: Not on file   Other Topics Concern  . Not on file   Social History Narrative  . No narrative on file    ROS: Please see the HPI.  All other systems reviewed and negative.  PHYSICAL EXAM:  BP 110/64  Pulse 59  Ht 5\' 4"  (1.626 m)  Wt 125 lb 1.9 oz (56.754 kg)  BMI 21.48 kg/m2  General: Thin, older woman in no distress Head:  Normocephalic and atraumatic. Neck: no JVD Lungs: Clear to auscultation and percussion. Heart: Normal S1 and S2. S4 gallop.  Minimal apical murmur.    Pulses: Pulses normal in all 4 extremities. Extremities: No clubbing or cyanosis. No edema. Neurologic: Alert and oriented x 3.  EKG:  NSR.  ILBBB.  No acute changes.    ASSESSMENT AND PLAN:

## 2012-04-22 DIAGNOSIS — I340 Nonrheumatic mitral (valve) insufficiency: Secondary | ICD-10-CM | POA: Insufficient documentation

## 2012-04-22 NOTE — Assessment & Plan Note (Signed)
Has mod MR by echo one year ago.  Not all that noticeable on exam.  Of note, she had some associated pulm HTN which may contribute to her symptoms as well as mainly exertional dyspnea and mild chest pressure at higher work loads.  Would favor repeat echo within the next year.  Anatomically, this appeared to be mainly associated with MAC.

## 2012-04-22 NOTE — Assessment & Plan Note (Signed)
She has been getting her lipids checked by her primary MD.

## 2012-04-22 NOTE — Assessment & Plan Note (Signed)
The patient has had some anginal sounding chest pain.  Her pattern seems stable.  We have discussed consideration of repeat catheterization, but she has been pretty reluctant to consider this.  As long as she remains stable, she prefers to be treated conservatively.  If she has a change in pattern she knows to contact us immediately.

## 2012-04-26 ENCOUNTER — Other Ambulatory Visit: Payer: Commercial Managed Care - PPO

## 2012-04-26 NOTE — Progress Notes (Signed)
Per Dr Riley Kill: See if we can get her lipid profiles from Dr. Sherril Croon office. Thanks.   I spoke with Dr Sherril Croon office and they do not have any recent cholesterol results on this patient. The pt is actually scheduled to come into the office today for fasting lab work (lipid, liver, BMP and CBC). [name] Graham

## 2012-07-08 DIAGNOSIS — R079 Chest pain, unspecified: Secondary | ICD-10-CM

## 2012-07-09 DIAGNOSIS — R079 Chest pain, unspecified: Secondary | ICD-10-CM

## 2012-09-27 ENCOUNTER — Other Ambulatory Visit: Payer: Self-pay | Admitting: Cardiology

## 2012-10-12 ENCOUNTER — Other Ambulatory Visit: Payer: Self-pay | Admitting: *Deleted

## 2012-10-12 DIAGNOSIS — I447 Left bundle-branch block, unspecified: Secondary | ICD-10-CM

## 2012-10-12 DIAGNOSIS — E7800 Pure hypercholesterolemia, unspecified: Secondary | ICD-10-CM

## 2012-10-12 DIAGNOSIS — I2510 Atherosclerotic heart disease of native coronary artery without angina pectoris: Secondary | ICD-10-CM

## 2012-10-12 DIAGNOSIS — I10 Essential (primary) hypertension: Secondary | ICD-10-CM

## 2012-10-12 DIAGNOSIS — I38 Endocarditis, valve unspecified: Secondary | ICD-10-CM

## 2012-10-24 ENCOUNTER — Other Ambulatory Visit: Payer: Commercial Managed Care - PPO

## 2012-10-24 ENCOUNTER — Ambulatory Visit: Payer: Commercial Managed Care - PPO | Admitting: Cardiology

## 2012-11-28 ENCOUNTER — Ambulatory Visit: Payer: Commercial Managed Care - PPO | Admitting: Cardiology

## 2012-11-28 ENCOUNTER — Other Ambulatory Visit: Payer: Commercial Managed Care - PPO

## 2012-12-20 ENCOUNTER — Other Ambulatory Visit: Payer: Self-pay | Admitting: *Deleted

## 2012-12-20 MED ORDER — OMEPRAZOLE 40 MG PO CPDR: 40.0000 mg | DELAYED_RELEASE_CAPSULE | Freq: Every day | ORAL | Status: DC

## 2013-01-08 ENCOUNTER — Telehealth: Payer: Self-pay | Admitting: Cardiology

## 2013-01-08 NOTE — Telephone Encounter (Signed)
I attempted to reach the pt two other times during the day and a child kept answering the phone and stating that the pt was not at home. At this time I do not have any openings on 01/12/13 schedule.  I do have openings next week if the pt would like to see Dr Riley Kill at that time.

## 2013-01-08 NOTE — Telephone Encounter (Signed)
Attempted to reach the pt but no answer at number.

## 2013-01-08 NOTE — Telephone Encounter (Signed)
New Problem:    Patient called in wanting to be worked in to be seen by Dr. Riley Kill on 01/12/13.  Please call back.

## 2013-01-10 NOTE — Telephone Encounter (Signed)
I spoke with the pt and she is scheduled to see Dr Riley Kill on 01/19/13.

## 2013-01-19 ENCOUNTER — Ambulatory Visit: Payer: Commercial Managed Care - PPO | Admitting: Cardiology

## 2013-01-24 ENCOUNTER — Encounter: Payer: Self-pay | Admitting: Cardiology

## 2013-01-24 ENCOUNTER — Ambulatory Visit (INDEPENDENT_AMBULATORY_CARE_PROVIDER_SITE_OTHER): Payer: Medicare Other | Admitting: Cardiology

## 2013-01-24 ENCOUNTER — Other Ambulatory Visit (INDEPENDENT_AMBULATORY_CARE_PROVIDER_SITE_OTHER): Payer: Medicare Other

## 2013-01-24 VITALS — BP 118/74 | HR 83 | Ht 64.0 in | Wt 122.0 lb

## 2013-01-24 DIAGNOSIS — I2510 Atherosclerotic heart disease of native coronary artery without angina pectoris: Secondary | ICD-10-CM

## 2013-01-24 DIAGNOSIS — E785 Hyperlipidemia, unspecified: Secondary | ICD-10-CM

## 2013-01-24 DIAGNOSIS — R0989 Other specified symptoms and signs involving the circulatory and respiratory systems: Secondary | ICD-10-CM

## 2013-01-24 LAB — BASIC METABOLIC PANEL
CO2: 24 mEq/L (ref 19–32)
Calcium: 9.4 mg/dL (ref 8.4–10.5)
Creatinine, Ser: 0.9 mg/dL (ref 0.4–1.2)
GFR: 66.35 mL/min (ref >60.00)
Glucose, Bld: 135 mg/dL — ABNORMAL HIGH (ref 70–99)
Sodium: 139 mEq/L (ref 135–145)

## 2013-01-24 LAB — CBC WITH DIFFERENTIAL/PLATELET
Basophils Absolute: 0 10*3/uL (ref 0.0–0.1)
HCT: 39.3 % (ref 36.0–46.0)
Hemoglobin: 13.6 g/dL (ref 12.0–15.0)
Lymphs Abs: 1.6 10*3/uL (ref 0.7–4.0)
MCV: 86 fl (ref 78.0–100.0)
Monocytes Absolute: 0.4 10*3/uL (ref 0.1–1.0)
Monocytes Relative: 6.4 % (ref 3.0–12.0)
Neutro Abs: 4.6 10*3/uL (ref 1.4–7.7)
Platelets: 146 10*3/uL — ABNORMAL LOW (ref 150.0–400.0)
RDW: 13.4 % (ref 11.5–14.6)

## 2013-01-24 MED ORDER — NITROGLYCERIN 0.4 MG SL SUBL: 0.4000 mg | SUBLINGUAL_TABLET | SUBLINGUAL | Status: DC | PRN

## 2013-01-24 MED ORDER — HYDROCHLOROTHIAZIDE 12.5 MG PO TABS: 12.5000 mg | ORAL_TABLET | Freq: Every day | ORAL | Status: DC

## 2013-01-24 MED ORDER — METOPROLOL TARTRATE 50 MG PO TABS: 50.0000 mg | ORAL_TABLET | Freq: Two times a day (BID) | ORAL | Status: DC

## 2013-01-24 MED ORDER — ISOSORBIDE MONONITRATE ER 120 MG PO TB24: 120.0000 mg | ORAL_TABLET | Freq: Every day | ORAL | Status: DC

## 2013-01-24 MED ORDER — SIMVASTATIN 40 MG PO TABS: 40.0000 mg | ORAL_TABLET | Freq: Every day | ORAL | Status: DC

## 2013-01-24 MED ORDER — POTASSIUM CHLORIDE ER 10 MEQ PO TBCR: 10.0000 meq | EXTENDED_RELEASE_TABLET | Freq: Every day | ORAL | Status: DC

## 2013-01-24 NOTE — Patient Instructions (Addendum)
Take your HCTZ everyday  Start taking potassium daily  You will need lab work today: BMP & CBC  Follow-up with Dr. Riley Kill in 2 weeks

## 2013-01-24 NOTE — Assessment & Plan Note (Addendum)
Patient's had prior coronary bypass graft surgery, and post bypass angina. She has not been amenable to workup at this point in time. She's been  treated medically, and has remained stable but it sounds as though she's got some mild volume overload. I've taken the liberty of resuming her hydrochlorothiazide, and we will add low-dose potassium to her regimen. In addition we will get a CBC and basic metabolic profile and we will request her records from [facility] hospital to see if she does have bilateral pleural effusions. We will see her back in followup approximately 2 weeks to reassess her status at that time. She may need further evaluation with perhaps echocardiography.

## 2013-01-24 NOTE — Progress Notes (Signed)
HPI:  Reason for followup. She think she is doing pretty well, but she does admit to some shortness of breath. She has some abdominal issues, and had a CAT scan done which apparently sounded like it showed bilateral pleural effusions. She's not been taking her hydrochlorothiazide and regular basis. She does not take her other medications she does get some angina. In the past I have recommended consideration of repeat catheterization, but she did not want to go this route. She denies progressive symptoms, and is good spirits today.  Current Outpatient Prescriptions  Medication Sig Dispense Refill  . aspirin 81 MG tablet Take 81 mg by mouth daily.       . Calcium Carbonate-Vit D-Min 600-400 MG-UNIT TABS Take 1 tablet by mouth daily.        . hydrochlorothiazide (HYDRODIURIL) 12.5 MG tablet Take 1 tablet (12.5 mg total) by mouth daily.  90 tablet  3  . isosorbide mononitrate (IMDUR) 120 MG 24 hr tablet TAKE ONE TABLET BY MOUTH IN THE MORNING  30 tablet  5  . metoprolol (LOPRESSOR) 50 MG tablet Take 1 tablet (50 mg total) by mouth 2 (two) times daily.  60 tablet  8  . Multiple Vitamins-Minerals (MULTIVITAMIN WITH MINERALS) tablet Take 1 tablet by mouth daily.        . nitroGLYCERIN (NITROSTAT) 0.4 MG SL tablet Place 1 tablet (0.4 mg total) under the tongue every 5 (five) minutes as needed.  25 tablet  3  . omeprazole (PRILOSEC) 40 MG capsule Take 1 capsule (40 mg total) by mouth daily.  90 capsule  3  . simvastatin (ZOCOR) 40 MG tablet Take 1 tablet (40 mg total) by mouth at bedtime.  30 tablet  6   No current facility-administered medications for this visit.    No Known Allergies  Past Medical History  Diagnosis Date  . Exertional angina   . Coronary artery disease     single vessel of the LAD  . Normal echocardiogram     normal lv function  . Bundle branch block   . Lung nodule     lower  . Anemia     mild normocytic anemia  . Thrombocytopenia     platelet count 80,000  . Anxiety     . GERD (gastroesophageal reflux disease)     Past Surgical History  Procedure Laterality Date  . Coronary artery bypass graft  11/04/2008      Kerin Perna, M.D.    Family History  Problem Relation Age of Onset  . Heart failure Mother   . Heart failure Father     History   Social History  . Marital Status: Married    Spouse Name: N/A    Number of Children: N/A  . Years of Education: N/A   Occupational History  . RETIRED    Social History Main Topics  . Smoking status: Never Smoker   . Smokeless tobacco: Not on file  . Alcohol Use: No  . Drug Use: No  . Sexually Active: Not on file   Other Topics Concern  . Not on file   Social History Narrative  . No narrative on file    ROS: Please see the HPI.  All other systems reviewed and negative.  PHYSICAL EXAM:  BP 118/74  Pulse 83  Ht 5\' 4"  (1.626 m)  Wt 122 lb (55.339 kg)  BMI 20.93 kg/m2  SpO2 99%  General: Well developed, well nourished, in no acute distress. Head:  Normocephalic  and atraumatic. Neck: no JVD.  Carefully assessed.   Lungs: Clear to auscultation and percussion.  No definite rales.   Heart: Normal S1 and S2.  No murmur, rubs or gallops.  Abdomen:  Normal bowel sounds; soft; non tender; no organomegaly Pulses: Pulses normal in all 4 extremities. Extremities: No clubbing or cyanosis. No edema. Neurologic: Alert and oriented x 3.  EKG:  NSR.  LBBB.    ASSESSMENT AND PLAN:

## 2013-01-28 NOTE — Assessment & Plan Note (Signed)
Labs have been done at her primary care physician's office.  We will defer to them.

## 2013-02-07 ENCOUNTER — Encounter: Payer: Self-pay | Admitting: Cardiology

## 2013-02-07 ENCOUNTER — Ambulatory Visit (INDEPENDENT_AMBULATORY_CARE_PROVIDER_SITE_OTHER): Payer: Medicare Other | Admitting: Cardiology

## 2013-02-07 VITALS — BP 126/86 | HR 94 | Ht 64.0 in | Wt 123.0 lb

## 2013-02-07 DIAGNOSIS — I340 Nonrheumatic mitral (valve) insufficiency: Secondary | ICD-10-CM

## 2013-02-07 DIAGNOSIS — R079 Chest pain, unspecified: Secondary | ICD-10-CM

## 2013-02-07 DIAGNOSIS — I059 Rheumatic mitral valve disease, unspecified: Secondary | ICD-10-CM

## 2013-02-07 DIAGNOSIS — I10 Essential (primary) hypertension: Secondary | ICD-10-CM

## 2013-02-07 LAB — HEPATIC FUNCTION PANEL
AST: 18 U/L (ref 0–37)
Albumin: 4.1 g/dL (ref 3.5–5.2)
Alkaline Phosphatase: 78 U/L (ref 39–117)
Total Protein: 6.9 g/dL (ref 6.0–8.3)

## 2013-02-07 LAB — CBC WITH DIFFERENTIAL/PLATELET
Basophils Absolute: 0.1 10*3/uL (ref 0.0–0.1)
Eosinophils Absolute: 0.2 10*3/uL (ref 0.0–0.7)
Lymphocytes Relative: 23.5 % (ref 12.0–46.0)
MCHC: 34.2 g/dL (ref 30.0–36.0)
Neutrophils Relative %: 66.2 % (ref 43.0–77.0)
Platelets: 145 10*3/uL — ABNORMAL LOW (ref 150.0–400.0)
RDW: 13.5 % (ref 11.5–14.6)

## 2013-02-07 LAB — BASIC METABOLIC PANEL
CO2: 23 mEq/L (ref 19–32)
Calcium: 9.1 mg/dL (ref 8.4–10.5)
Creatinine, Ser: 0.9 mg/dL (ref 0.4–1.2)
Glucose, Bld: 114 mg/dL — ABNORMAL HIGH (ref 70–99)

## 2013-02-07 LAB — PROTIME-INR: INR: 1.1 ratio — ABNORMAL HIGH (ref 0.8–1.0)

## 2013-02-07 LAB — LIPID PANEL: Cholesterol: 191 mg/dL (ref 0–200)

## 2013-02-07 NOTE — Assessment & Plan Note (Signed)
I am suspicious that she has graft issues, dating back from not to long after surgery.  It has gotten some worse.  We will plan for consideration of repeat cardiac cath. She has mod MR on last cath, and recent pleural effusions, so will consider R and L.

## 2013-02-07 NOTE — Patient Instructions (Addendum)
Your physician has requested that you have a cardiac catheterization. Cardiac catheterization is used to diagnose and/or treat various heart conditions. Doctors may recommend this procedure for a number of different reasons. The most common reason is to evaluate chest pain. Chest pain can be a symptom of coronary artery disease (CAD), and cardiac catheterization can show whether plaque is narrowing or blocking your heart's arteries. This procedure is also used to evaluate the valves, as well as measure the blood flow and oxygen levels in different parts of your heart. For further information please visit [email or web address] Please follow instruction sheet, as given.  Your physician recommends that you continue on your current medications as directed. Please refer to the Current Medication list given to you today.  Your physician recommends that you have lab work today: BMP, CBC, LIPID, LIVER and PT/INR

## 2013-02-07 NOTE — Assessment & Plan Note (Signed)
Improved with HCTZ 

## 2013-02-07 NOTE — Progress Notes (Signed)
HPI:  Patient is in for follow up.  She still has some exertional chest pain, especially when she moves her arms.  She does feel that the diuretic has helped her.  However, symptoms have been long standing, and typical of what she had prior to her CABG.  It has been slightly worse past few months.    Current Outpatient Prescriptions  Medication Sig Dispense Refill  . aspirin 81 MG tablet Take 81 mg by mouth daily.       . Calcium Carbonate-Vit D-Min 600-400 MG-UNIT TABS Take 1 tablet by mouth daily.        . hydrochlorothiazide (HYDRODIURIL) 12.5 MG tablet Take 1 tablet (12.5 mg total) by mouth daily.  90 tablet  3  . isosorbide mononitrate (IMDUR) 120 MG 24 hr tablet Take 1 tablet (120 mg total) by mouth daily.  90 tablet  3  . metoprolol (LOPRESSOR) 50 MG tablet Take 1 tablet (50 mg total) by mouth 2 (two) times daily.  180 tablet  3  . Multiple Vitamins-Minerals (MULTIVITAMIN WITH MINERALS) tablet Take 1 tablet by mouth daily.        . nitroGLYCERIN (NITROSTAT) 0.4 MG SL tablet Place 1 tablet (0.4 mg total) under the tongue every 5 (five) minutes as needed.  25 tablet  8  . omeprazole (PRILOSEC) 40 MG capsule Take 1 capsule (40 mg total) by mouth daily.  90 capsule  3  . potassium chloride (K-DUR) 10 MEQ tablet Take 1 tablet (10 mEq total) by mouth daily.  90 tablet  3  . simvastatin (ZOCOR) 40 MG tablet Take 1 tablet (40 mg total) by mouth at bedtime.  90 tablet  3   No current facility-administered medications for this visit.    No Known Allergies  Past Medical History  Diagnosis Date  . Exertional angina   . Coronary artery disease     single vessel of the LAD  . Normal echocardiogram     normal lv function  . Bundle branch block   . Lung nodule     lower  . Anemia     mild normocytic anemia  . Thrombocytopenia     platelet count 80,000  . Anxiety   . GERD (gastroesophageal reflux disease)     Past Surgical History  Procedure Laterality Date  . Coronary artery bypass  graft  11/04/2008      Kerin Perna, M.D.    Family History  Problem Relation Age of Onset  . Heart failure Mother   . Heart failure Father     History   Social History  . Marital Status: Married    Spouse Name: N/A    Number of Children: N/A  . Years of Education: N/A   Occupational History  . RETIRED    Social History Main Topics  . Smoking status: Never Smoker   . Smokeless tobacco: Not on file  . Alcohol Use: No  . Drug Use: No  . Sexually Active: Not on file   Other Topics Concern  . Not on file   Social History Narrative  . No narrative on file    ROS: Please see the HPI.  All other systems reviewed and negative.  PHYSICAL EXAM:  BP 126/86  Pulse 94  Ht 5\' 4"  (1.626 m)  Wt 123 lb (55.792 kg)  BMI 21.1 kg/m2  SpO2 98%  General: Well developed, well nourished, in no acute distress. Head:  Normocephalic and atraumatic. Neck: no JVD Lungs: Clear to  auscultation and percussion. Heart: Normal S1 and S2.  No murmur, rubs or gallops.  Abdomen:  Normal bowel sounds; soft; non tender; no organomegaly.  No bruits.   Pulses: Pulses normal in all 4 extremities. Extremities: No clubbing or cyanosis. No edema. Neurologic: Alert and oriented x 3.  EKG:  Not done today.    ASSESSMENT AND PLAN:  Full evaluation 45 minutes.

## 2013-02-07 NOTE — Assessment & Plan Note (Signed)
See last echo study.

## 2013-02-14 ENCOUNTER — Telehealth: Payer: Self-pay | Admitting: Cardiology

## 2013-02-14 NOTE — Telephone Encounter (Signed)
Spoke with patient she stated she was feeling good no chest pain.States she is walking 1 mile everyday with no chest pain.Wants to know if she can cancel her cath scheduled for 02/16/13.Message sent to Dr.Stuckey's nurse.

## 2013-02-14 NOTE — Telephone Encounter (Signed)
New Problem:    Patient called in wanting to postpone her Cath on 02/16/13 because she is feeling well.   Please call back.

## 2013-02-15 NOTE — Telephone Encounter (Signed)
I spoke with Dr Riley Kill and he said if the pt is feeling better then she can cancel procedure.  He would like the pt to schedule a follow-up appointment with him prior to his retirement to assess how she is feeling.  Pt agreed with plan and cath lab notified to cancel procedure.  The pt will contact our office is she develops any recurrent symptoms.

## 2013-02-15 NOTE — Telephone Encounter (Signed)
Follow-up: ° ° ° °Patient called in returning your call.  Please call back. °

## 2013-02-15 NOTE — Telephone Encounter (Signed)
New problem    Would like to postpone procedure on tomorrow.

## 2013-02-15 NOTE — Telephone Encounter (Signed)
I spoke with the pt and she would like to hold off on cardiac catheterization tomorrow.  The pt has resumed her medications as prescribed and is now feeling better. The pt has been walking a mile daily and can do this without any CP or SOB. I will discuss this pt with Dr Riley Kill.

## 2013-02-15 NOTE — Telephone Encounter (Signed)
PT CALLING BACK WITH A NEW NUMBER [phone], if dr Riley Kill  wants her to have it she will

## 2013-02-15 NOTE — Telephone Encounter (Signed)
I attempted to reach the pt but no answer at home number.  

## 2013-03-20 ENCOUNTER — Encounter: Payer: Self-pay | Admitting: Cardiology

## 2013-03-20 ENCOUNTER — Ambulatory Visit (INDEPENDENT_AMBULATORY_CARE_PROVIDER_SITE_OTHER): Payer: Medicare Other | Admitting: Cardiology

## 2013-03-20 VITALS — BP 124/76 | HR 79 | Ht 64.0 in | Wt 128.0 lb

## 2013-03-20 DIAGNOSIS — E7800 Pure hypercholesterolemia, unspecified: Secondary | ICD-10-CM

## 2013-03-20 DIAGNOSIS — I340 Nonrheumatic mitral (valve) insufficiency: Secondary | ICD-10-CM

## 2013-03-20 DIAGNOSIS — I059 Rheumatic mitral valve disease, unspecified: Secondary | ICD-10-CM

## 2013-03-20 DIAGNOSIS — I10 Essential (primary) hypertension: Secondary | ICD-10-CM

## 2013-03-20 DIAGNOSIS — I2510 Atherosclerotic heart disease of native coronary artery without angina pectoris: Secondary | ICD-10-CM

## 2013-03-20 NOTE — Progress Notes (Signed)
HPI:  The patient is in for followup visit. She still notices that she gets some tightness in the chest when she does any vigorous exertion. She's not had progressive shortness of breath, and has remained stable, but clearly has class 2//3 symptoms.   She denies rest chest pain    Current Outpatient Prescriptions  Medication Sig Dispense Refill  . aspirin 81 MG tablet Take 81 mg by mouth daily.       . Calcium Carbonate-Vit D-Min 600-400 MG-UNIT TABS Take 1 tablet by mouth daily.        . hydrochlorothiazide (HYDRODIURIL) 12.5 MG tablet Take 1 tablet (12.5 mg total) by mouth daily.  90 tablet  3  . isosorbide mononitrate (IMDUR) 120 MG 24 hr tablet Take 1 tablet (120 mg total) by mouth daily.  90 tablet  3  . metoprolol (LOPRESSOR) 50 MG tablet Take 1 tablet (50 mg total) by mouth 2 (two) times daily.  180 tablet  3  . Multiple Vitamins-Minerals (MULTIVITAMIN WITH MINERALS) tablet Take 1 tablet by mouth daily.        . nitroGLYCERIN (NITROSTAT) 0.4 MG SL tablet Place 1 tablet (0.4 mg total) under the tongue every 5 (five) minutes as needed.  25 tablet  8  . omeprazole (PRILOSEC) 40 MG capsule Take 1 capsule (40 mg total) by mouth daily.  90 capsule  3  . potassium chloride (K-DUR) 10 MEQ tablet Take 1 tablet (10 mEq total) by mouth daily.  90 tablet  3  . simvastatin (ZOCOR) 40 MG tablet Take 1 tablet (40 mg total) by mouth at bedtime.  90 tablet  3   No current facility-administered medications for this visit.    No Known Allergies  Past Medical History  Diagnosis Date  . Exertional angina   . Coronary artery disease     single vessel of the LAD  . Normal echocardiogram     normal lv function  . Bundle branch block   . Lung nodule     lower  . Anemia     mild normocytic anemia  . Thrombocytopenia     platelet count 80,000  . Anxiety   . GERD (gastroesophageal reflux disease)     Past Surgical History  Procedure Laterality Date  . Coronary artery bypass graft  11/04/2008        Kerin Perna, M.D.    Family History  Problem Relation Age of Onset  . Heart failure Mother   . Heart failure Father     History   Social History  . Marital Status: Married    Spouse Name: N/A    Number of Children: N/A  . Years of Education: N/A   Occupational History  . RETIRED    Social History Main Topics  . Smoking status: Never Smoker   . Smokeless tobacco: Not on file  . Alcohol Use: No  . Drug Use: No  . Sexually Active: Not on file   Other Topics Concern  . Not on file   Social History Narrative  . No narrative on file    ROS: Please see the HPI.  All other systems reviewed and negative.  PHYSICAL EXAM:  BP 124/76  Pulse 79  Ht 5\' 4"  (1.626 m)  Wt 128 lb (58.06 kg)  BMI 21.96 kg/m2  SpO2 98%  General: Well developed, well nourished, in no acute distress. Head:  Normocephalic and atraumatic. Neck: no JVD Lungs: Clear to auscultation and percussion. Heart: Normal S1 and  S2.  Minimal if any lateral murmur.   Pulses: Pulses normal in all 4 extremities. Extremities: No clubbing or cyanosis. No edema. Neurologic: Alert and oriented x 3.  EKG:  NSR.  Nonspecific IVCD.  No acute changes.    ASSESSMENT AND PLAN:

## 2013-03-20 NOTE — Patient Instructions (Addendum)
Your physician has requested that you have an echocardiogram. Echocardiography is a painless test that uses sound waves to create images of your heart. It provides your doctor with information about the size and shape of your heart and how well your heart's chambers and valves are working. This procedure takes approximately one hour. There are no restrictions for this procedure.  A chest x-ray takes a picture of the organs and structures inside the chest, including the heart, lungs, and blood vessels. This test can show several things, including, whether the heart is enlarges; whether fluid is building up in the lungs; and whether pacemaker / defibrillator leads are still in place. THIS SHOULD BE PERFORMED AT THE [facility] FROM [facility].  Your physician recommends that you continue on your current medications as directed. Please refer to the Current Medication list given to you today.  Your physician has requested that you have a cardiac catheterization. Cardiac catheterization is used to diagnose and/or treat various heart conditions. Doctors may recommend this procedure for a number of different reasons. The most common reason is to evaluate chest pain. Chest pain can be a symptom of coronary artery disease (CAD), and cardiac catheterization can show whether plaque is narrowing or blocking your heart's arteries. This procedure is also used to evaluate the valves, as well as measure the blood flow and oxygen levels in different parts of your heart. For further information please visit [email or web address] Please follow instruction sheet, as given.

## 2013-03-21 NOTE — Assessment & Plan Note (Signed)
We will address this further once her studies complete

## 2013-03-21 NOTE — Assessment & Plan Note (Signed)
The patient's had recurrent anginal type symptoms, and these have been relatively persistent. She is somewhat shy about talking about at all, and this is in part I think due to reluctance to consider another catheterization. However she nicely listened carefully a day, and is in agreement to go ahead with catheterization. Giving some level of underlying mitral regurgitation, shortness of breath, and some suggestion that she has pulmonary hypertension, it would be worthwhile to do both right and left heart catheterization.  Her graft status couldn't be assessed as well as her hemodynamics. We will get a two-dimensional echocardiogram prior to the catheterization procedure, and this should give [place] some insight going in. Laboratory studies will need to be obtained. Please see overview  for prior cath and bypass information.  She understands the risks and is willing to proceed.

## 2013-03-21 NOTE — Assessment & Plan Note (Signed)
We will repeat her echocardiogram to try to better characterize this prior to  catheterization

## 2013-03-21 NOTE — Assessment & Plan Note (Signed)
This is currently well controlled 

## 2013-04-09 ENCOUNTER — Ambulatory Visit (INDEPENDENT_AMBULATORY_CARE_PROVIDER_SITE_OTHER)
Admission: RE | Admit: 2013-04-09 | Discharge: 2013-04-09 | Disposition: A | Discharge status: Home / Self Care | Payer: Medicare Other | Source: Ambulatory Visit | Attending: Cardiology | Admitting: Cardiology

## 2013-04-09 ENCOUNTER — Ambulatory Visit (HOSPITAL_COMMUNITY): Payer: Medicare Other | Attending: Cardiology

## 2013-04-09 ENCOUNTER — Other Ambulatory Visit (INDEPENDENT_AMBULATORY_CARE_PROVIDER_SITE_OTHER): Payer: Medicare Other

## 2013-04-09 DIAGNOSIS — I10 Essential (primary) hypertension: Secondary | ICD-10-CM | POA: Insufficient documentation

## 2013-04-09 DIAGNOSIS — I340 Nonrheumatic mitral (valve) insufficiency: Secondary | ICD-10-CM

## 2013-04-09 DIAGNOSIS — I2510 Atherosclerotic heart disease of native coronary artery without angina pectoris: Secondary | ICD-10-CM

## 2013-04-09 DIAGNOSIS — I059 Rheumatic mitral valve disease, unspecified: Secondary | ICD-10-CM

## 2013-04-09 DIAGNOSIS — D696 Thrombocytopenia, unspecified: Secondary | ICD-10-CM | POA: Insufficient documentation

## 2013-04-09 DIAGNOSIS — E785 Hyperlipidemia, unspecified: Secondary | ICD-10-CM | POA: Insufficient documentation

## 2013-04-09 DIAGNOSIS — R079 Chest pain, unspecified: Secondary | ICD-10-CM | POA: Insufficient documentation

## 2013-04-09 LAB — BASIC METABOLIC PANEL
CO2: 25 mEq/L (ref 19–32)
Chloride: 106 mEq/L (ref 96–112)
Creatinine, Ser: 0.9 mg/dL (ref 0.4–1.2)
Potassium: 3.8 mEq/L (ref 3.5–5.1)

## 2013-04-09 LAB — CBC WITH DIFFERENTIAL/PLATELET
Basophils Relative: 1.1 % (ref 0.0–3.0)
Eosinophils Absolute: 0.2 10*3/uL (ref 0.0–0.7)
Eosinophils Relative: 4.3 % (ref 0.0–5.0)
HCT: 36.2 % (ref 36.0–46.0)
Hemoglobin: 12.4 g/dL (ref 12.0–15.0)
Lymphs Abs: 1.4 10*3/uL (ref 0.7–4.0)
MCHC: 34.3 g/dL (ref 30.0–36.0)
MCV: 86.1 fl (ref 78.0–100.0)
Monocytes Absolute: 0.5 10*3/uL (ref 0.1–1.0)
Neutro Abs: 3.3 10*3/uL (ref 1.4–7.7)
Neutrophils Relative %: 60.1 % (ref 43.0–77.0)
RBC: 4.21 Mil/uL (ref 3.87–5.11)
WBC: 5.6 10*3/uL (ref 4.5–10.5)

## 2013-04-09 NOTE — Progress Notes (Signed)
Echocardiogram performed.  

## 2013-04-10 NOTE — H&P (Signed)
Coronary atherosclerosis of native coronary artery    -  Primary    414.01    Essential hypertension, benign        401.1    Mitral regurgitation        424.0    Hypercholesterolemia        272.0          Progress Notes    Herby Abraham, MD at 03/21/2013  7:25 AM    Status: Signed                      HPI:  The patient is in for followup visit. She still notices that she gets some tightness in the chest when she does any vigorous exertion. She's not had progressive shortness of breath, and has remained stable, but clearly has class 2//3 symptoms.   She denies rest chest pain      Current Outpatient Prescriptions   Medication  Sig  Dispense  Refill   .  aspirin 81 MG tablet  Take 81 mg by mouth daily.          .  Calcium Carbonate-Vit D-Min 600-400 MG-UNIT TABS  Take 1 tablet by mouth daily.           .  hydrochlorothiazide (HYDRODIURIL) 12.5 MG tablet  Take 1 tablet (12.5 mg total) by mouth daily.   90 tablet   3   .  isosorbide mononitrate (IMDUR) 120 MG 24 hr tablet  Take 1 tablet (120 mg total) by mouth daily.   90 tablet   3   .  metoprolol (LOPRESSOR) 50 MG tablet  Take 1 tablet (50 mg total) by mouth 2 (two) times daily.   180 tablet   3   .  Multiple Vitamins-Minerals (MULTIVITAMIN WITH MINERALS) tablet  Take 1 tablet by mouth daily.           .  nitroGLYCERIN (NITROSTAT) 0.4 MG SL tablet  Place 1 tablet (0.4 mg total) under the tongue every 5 (five) minutes as needed.   25 tablet   8   .  omeprazole (PRILOSEC) 40 MG capsule  Take 1 capsule (40 mg total) by mouth daily.   90 capsule   3   .  potassium chloride (K-DUR) 10 MEQ tablet  Take 1 tablet (10 mEq total) by mouth daily.   90 tablet   3   .  simvastatin (ZOCOR) 40 MG tablet  Take 1 tablet (40 mg total) by mouth at bedtime.   90 tablet   3       No current facility-administered medications for this visit.        No Known Allergies    Past Medical History   Diagnosis  Date   .  Exertional angina     .   Coronary artery disease         single vessel of the LAD   .  Normal echocardiogram         normal lv function   .  Bundle branch block     .  Lung nodule         lower   .  Anemia         mild normocytic anemia   .  Thrombocytopenia         platelet count 80,000   .  Anxiety     .  GERD (gastroesophageal reflux disease)  Past Surgical History   Procedure  Laterality  Date   .  Coronary artery bypass graft    11/04/2008         Kerin Perna, M.D.         Family History   Problem  Relation  Age of Onset   .  Heart failure  Mother     .  Heart failure  Father           History       Social History   .  Marital Status:  Married       Spouse Name:  N/A       Number of Children:  N/A   .  Years of Education:  N/A       Occupational History   .  RETIRED         Social History Main Topics   .  Smoking status:  Never Smoker    .  Smokeless tobacco:  Not on file   .  Alcohol Use:  No   .  Drug Use:  No   .  Sexually Active:  Not on file       Other Topics  Concern   .  Not on file       Social History Narrative   .  No narrative on file        ROS: Please see the HPI.  All other systems reviewed and negative.   PHYSICAL EXAM:   BP 124/76  Pulse 79  Ht 5\' 4"  (1.626 m)  Wt 128 lb (58.06 kg)  BMI 21.96 kg/m2  SpO2 98%   General: Well developed, well nourished, in no acute distress. Head:  Normocephalic and atraumatic. Neck: no JVD Lungs: Clear to auscultation and percussion. Heart: Normal S1 and S2.  Minimal if any lateral murmur.    Pulses: Pulses normal in all 4 extremities. Extremities: No clubbing or cyanosis. No edema. Neurologic: Alert and oriented x 3.   EKG:  NSR.  Nonspecific IVCD.  No acute changes.     ASSESSMENT AND PLAN:         CORONARY ATHEROSCLEROSIS NATIVE CORONARY ARTERY - Herby Abraham, MD at 03/21/2013  7:23 AM    Status: Written Related Problem: CORONARY ATHEROSCLEROSIS NATIVE CORONARY ARTERY            The patient's had recurrent anginal type symptoms, and these have been relatively persistent. She is somewhat shy about talking about at all, and this is in part I think due to reluctance to consider another catheterization. However she nicely listened carefully a day, and is in agreement to go ahead with catheterization. Giving some level of underlying mitral regurgitation, shortness of breath, and some suggestion that she has pulmonary hypertension, it would be worthwhile to do both right and left heart catheterization.  Her graft status couldn't be assessed as well as her hemodynamics. We will get a two-dimensional echocardiogram prior to the catheterization procedure, and this should give [place] some insight going in. Laboratory studies will need to be obtained. Please see overview  for prior cath and bypass information.  She understands the risks and is willing to proceed.         ESSENTIAL HYPERTENSION, BENIGN - Herby Abraham, MD at 03/21/2013  7:24 AM    Status: Written Related Problem: ESSENTIAL HYPERTENSION, BENIGN           This is currently well controlled  Hypercholesterolemia - Herby Abraham, MD at 03/21/2013  7:24 AM    Status: Written Related Problem: Hypercholesterolemia           We will address this further once her studies complete         Mitral regurgitation - Herby Abraham, MD at 03/21/2013  7:25 AM    Status: Written Related Problem: Mitral regurgitation           We will repeat her echocardiogram to try to better characterize this prior to  catheterization   Echo repeated.  Does not suggest pulmonary hypertension, nor sig MR.  For Cardiac catheterization this week.  Patient has been agreeable.

## 2013-04-12 ENCOUNTER — Other Ambulatory Visit (HOSPITAL_COMMUNITY): Payer: Self-pay | Admitting: Cardiovascular Disease

## 2013-04-12 ENCOUNTER — Encounter (HOSPITAL_BASED_OUTPATIENT_CLINIC_OR_DEPARTMENT_OTHER): Payer: Self-pay | Admitting: *Deleted

## 2013-04-12 ENCOUNTER — Encounter (HOSPITAL_BASED_OUTPATIENT_CLINIC_OR_DEPARTMENT_OTHER)
Admission: RE | Disposition: A | Discharge status: Home / Self Care | Payer: Self-pay | Source: Ambulatory Visit | Attending: Cardiovascular Disease

## 2013-04-12 ENCOUNTER — Inpatient Hospital Stay (HOSPITAL_BASED_OUTPATIENT_CLINIC_OR_DEPARTMENT_OTHER)
Admission: RE | Admit: 2013-04-12 | Discharge: 2013-04-12 | Disposition: A | Discharge status: Home / Self Care | Payer: Medicare Other | Source: Ambulatory Visit | Attending: Cardiovascular Disease | Admitting: Cardiovascular Disease

## 2013-04-12 DIAGNOSIS — I059 Rheumatic mitral valve disease, unspecified: Secondary | ICD-10-CM | POA: Insufficient documentation

## 2013-04-12 DIAGNOSIS — R911 Solitary pulmonary nodule: Secondary | ICD-10-CM | POA: Insufficient documentation

## 2013-04-12 DIAGNOSIS — R0602 Shortness of breath: Secondary | ICD-10-CM | POA: Insufficient documentation

## 2013-04-12 DIAGNOSIS — E7800 Pure hypercholesterolemia, unspecified: Secondary | ICD-10-CM | POA: Insufficient documentation

## 2013-04-12 DIAGNOSIS — D649 Anemia, unspecified: Secondary | ICD-10-CM | POA: Insufficient documentation

## 2013-04-12 DIAGNOSIS — I509 Heart failure, unspecified: Secondary | ICD-10-CM

## 2013-04-12 DIAGNOSIS — I2510 Atherosclerotic heart disease of native coronary artery without angina pectoris: Secondary | ICD-10-CM

## 2013-04-12 DIAGNOSIS — Z951 Presence of aortocoronary bypass graft: Secondary | ICD-10-CM | POA: Insufficient documentation

## 2013-04-12 DIAGNOSIS — Z794 Long term (current) use of insulin: Secondary | ICD-10-CM | POA: Insufficient documentation

## 2013-04-12 DIAGNOSIS — I454 Nonspecific intraventricular block: Secondary | ICD-10-CM | POA: Insufficient documentation

## 2013-04-12 DIAGNOSIS — I209 Angina pectoris, unspecified: Secondary | ICD-10-CM | POA: Insufficient documentation

## 2013-04-12 DIAGNOSIS — I10 Essential (primary) hypertension: Secondary | ICD-10-CM | POA: Insufficient documentation

## 2013-04-12 DIAGNOSIS — K219 Gastro-esophageal reflux disease without esophagitis: Secondary | ICD-10-CM | POA: Insufficient documentation

## 2013-04-12 LAB — POCT I-STAT 3, VENOUS BLOOD GAS (G3P V)
Bicarbonate: 21 mEq/L (ref 20.0–24.0)
O2 Saturation: 75 %
pCO2, Ven: 38.9 mmHg — ABNORMAL LOW (ref 45.0–50.0)
pCO2, Ven: 40.8 mmHg — ABNORMAL LOW (ref 45.0–50.0)
pH, Ven: 7.339 — ABNORMAL HIGH (ref 7.250–7.300)
pO2, Ven: 39 mmHg (ref 30.0–45.0)
pO2, Ven: 42 mmHg (ref 30.0–45.0)

## 2013-04-12 LAB — POCT I-STAT 3, ART BLOOD GAS (G3+): pH, Arterial: 7.376 (ref 7.350–7.450)

## 2013-04-12 SURGERY — JV LEFT AND RIGHT HEART CATHETERIZATION WITH CORONARY ANGIOGRAM
Anesthesia: Moderate Sedation

## 2013-04-12 MED ORDER — SODIUM CHLORIDE 0.9 % IV SOLN
INTRAVENOUS | Status: DC
  Administered 2013-04-12: 08:00:00 via INTRAVENOUS

## 2013-04-12 MED ORDER — SODIUM CHLORIDE 0.9 % IV SOLN: 250.0000 mL | INTRAVENOUS | Status: DC | PRN

## 2013-04-12 MED ORDER — ONDANSETRON HCL 4 MG/2ML IJ SOLN: 4.0000 mg | Freq: Four times a day (QID) | INTRAMUSCULAR | Status: DC | PRN

## 2013-04-12 MED ORDER — SODIUM CHLORIDE 0.9 % IJ SOLN: 3.0000 mL | Freq: Two times a day (BID) | INTRAMUSCULAR | Status: DC

## 2013-04-12 MED ORDER — DIAZEPAM 5 MG PO TABS
5.0000 mg | ORAL_TABLET | ORAL | Status: AC
  Administered 2013-04-12: 5 mg via ORAL

## 2013-04-12 MED ORDER — ASPIRIN 81 MG PO CHEW
324.0000 mg | CHEWABLE_TABLET | ORAL | Status: AC
  Administered 2013-04-12: 324 mg via ORAL

## 2013-04-12 MED ORDER — SODIUM CHLORIDE 0.9 % IJ SOLN: 3.0000 mL | INTRAMUSCULAR | Status: DC | PRN

## 2013-04-12 MED ORDER — ACETAMINOPHEN 325 MG PO TABS: 650.0000 mg | ORAL_TABLET | ORAL | Status: DC | PRN

## 2013-04-12 NOTE — CV Procedure (Signed)
   Cardiac Catheterization Procedure Note  Name: [name] Graham MRN: [id number] DOB: [birth year]  Procedure: Right Heart Cath, Left Heart Cath, Selective Coronary Angiography, LV angiography, saphenous vein graft angiography, LIMA angiography.   Indication: CCS class III angina, shortness of breath, known coronary artery disease status post CABG.   Procedural Details: The right groin was prepped, draped, and anesthetized with 1% lidocaine. Using the modified Seldinger technique a 4 French sheath was placed in the right femoral artery and a 6 French sheath was placed in the right femoral vein. A multipurpose catheter was used for the right heart catheterization. Standard protocol was followed for recording of right heart pressures and sampling of oxygen saturations. Fick cardiac output was calculated. Standard Judkins catheters were used for selective coronary angiography and left ventriculography. A 3 DRC catheter was used to image the saphenous vein graft to diagonal and LIMA to LAD.There were no immediate procedural complications. The patient was transferred to the post catheterization recovery area for further monitoring.   Procedural Findings:  Hemodynamics  RA mean of 3  RV 29/5  PA 28/9 with a mean of 18  PCWP mean of 4, V wave of 6  LV 103/8  AO 104/47 with a mean of 71  Oxygen saturations:  PA 75  AO 95  SVC 71  Cardiac Output (Fick) 6 L/min  Cardiac Index (Fick) 2.3 L/min/meter squared   Coronary angiography:  Coronary dominance: right   Left mainstem: The left main is widely patent without obstructive disease.  Left anterior descending (LAD): The LAD is diffusely diseased throughout the proximal and mid aspects of the vessel. There is mild associated calcification. The proximal vessel has tandem 50% lesions. The mid vessel has 75% stenosis that does not significantly change with intracoronary nitroglycerin. Further down in the mid vessel and distal LAD there is no  significant obstructive disease. The diagonal branch is occluded and it fills from graft flow.  Left circumflex (LCx): The left circumflex is large in caliber. There are 2 obtuse marginals without significant disease. There is no obstructive disease throughout the course of the left circumflex distribution.  Right coronary artery (RCA): The right coronary artery is dominant. The vessel has 30% proximal stenosis and only minor irregularity through the mid vessel. The distal RCA is widely patent. The PDA and PLA branches are patent.  Saphenous vein graft to diagonal: The graft is diffusely small but there are no areas of significant stenosis. There is diffuse mild irregularity. The distal anastomotic site has a 30-40% stenosis.  LIMA to LAD: Patent but atretic.  Left ventriculography: Left ventricular systolic function is normal, LVEF is estimated at 65%, there is no significant mitral regurgitation   Final Conclusions:  1. Moderately severe diffuse proximal LAD stenosis, not significantly changed from 2009.  2. Widely patent left circumflex and right coronary arteries  3. Status post aortocoronary bypass surgery with continued patency of the saphenous vein graft to diagonal, but atretic LIMA to LAD  4. Normal left ventricular systolic function  5. Normal cardiac hemodynamics   Recommendations: Continued medical therapy. The patient's films were carefully reviewed and compared with her previous studies in 2009. There is no significant change in the appearance of her LAD and I think her overall cardiac situation is stable. Will continue with her current medical therapy. Her films were reviewed with Dr. Riley Kill.  Tonny Bollman  04/12/2013, 9:59 AM

## 2013-04-12 NOTE — Interval H&P Note (Signed)
History and Physical Interval Note:  04/12/2013 9:19 AM  [name] Graham  has presented today for surgery, with the diagnosis of MR and CP  The various methods of treatment have been discussed with the patient and family. After consideration of risks, benefits and other options for treatment, the patient has consented to  Procedure(s): JV LEFT AND RIGHT HEART CATHETERIZATION WITH CORONARY ANGIOGRAM (N/A) as a surgical intervention .  The patient's history has been reviewed, patient examined, no change in status, stable for surgery.  I have reviewed the patient's chart and labs.  Questions were answered to the patient's satisfaction.     Tonny Bollman

## 2013-04-12 NOTE — Progress Notes (Signed)
Bedrest begins @ 1010, tegaderm dressing applied to right groin site by Army Melia, site level 0.

## 2013-07-18 ENCOUNTER — Encounter: Payer: Self-pay | Admitting: Cardiovascular Disease

## 2013-07-18 ENCOUNTER — Ambulatory Visit (INDEPENDENT_AMBULATORY_CARE_PROVIDER_SITE_OTHER): Payer: Medicare Other | Admitting: Cardiovascular Disease

## 2013-07-18 VITALS — BP 131/80 | HR 93 | Ht 64.0 in | Wt 127.0 lb

## 2013-07-18 DIAGNOSIS — I2510 Atherosclerotic heart disease of native coronary artery without angina pectoris: Secondary | ICD-10-CM

## 2013-07-18 DIAGNOSIS — E785 Hyperlipidemia, unspecified: Secondary | ICD-10-CM

## 2013-07-18 MED ORDER — SIMVASTATIN 40 MG PO TABS: 40.0000 mg | ORAL_TABLET | Freq: Every day | ORAL | Status: DC

## 2013-07-18 MED ORDER — ISOSORBIDE MONONITRATE ER 120 MG PO TB24: 120.0000 mg | ORAL_TABLET | Freq: Every day | ORAL | Status: DC

## 2013-07-18 MED ORDER — HYDROCHLOROTHIAZIDE 12.5 MG PO TABS: 12.5000 mg | ORAL_TABLET | Freq: Every day | ORAL | Status: DC

## 2013-07-18 MED ORDER — METOPROLOL TARTRATE 50 MG PO TABS: 50.0000 mg | ORAL_TABLET | Freq: Two times a day (BID) | ORAL | Status: DC

## 2013-07-18 NOTE — Patient Instructions (Signed)
Your physician has recommended you make the following change in your medication: RESTART Simvastatin 40mg  take one by mouth every evening  Your physician wants you to follow-up in: 6 MONTHS with Dr Excell Seltzer.  You will receive a reminder letter in the mail two months in advance. If you don't receive a letter, please call our office to schedule the follow-up appointment.

## 2013-07-18 NOTE — Progress Notes (Signed)
HPI:   79 year old woman presenting for followup evaluation. She's been previously followed by Dr. Riley Kill. She has a history of coronary artery disease status post CABG in 2009. She underwent a recent heart catheterization to evaluate hemodynamics and coronary anatomy in the setting of anginal symptoms and shortness of breath. Her right heart pressures and hemodynamics were normal. The LAD showed stable diffuse moderate proximal vessel stenosis unchanged from 2009. Her graft anatomy was stable with patency of the vein graft to diagonal and atresia of the LIMA graft.  The patient is doing well. She has no change in symptoms to report. She has not had problems with chest pain or pressure recently. She has mild dyspnea with exertion but this is unchanged. She denies orthopnea, leg swelling, palpitations, or lightheadedness.  Outpatient Encounter Prescriptions as of 07/18/2013  Medication Sig Dispense Refill  . aspirin 81 MG tablet Take 81 mg by mouth daily.       . Calcium Carbonate-Vit D-Min 600-400 MG-UNIT TABS Take 1 tablet by mouth daily.        . hydrochlorothiazide (HYDRODIURIL) 12.5 MG tablet Take 1 tablet (12.5 mg total) by mouth daily.  90 tablet  3  . isosorbide mononitrate (IMDUR) 120 MG 24 hr tablet Take 1 tablet (120 mg total) by mouth daily.  90 tablet  3  . metoprolol (LOPRESSOR) 50 MG tablet Take 1 tablet (50 mg total) by mouth 2 (two) times daily.  180 tablet  3  . Multiple Vitamins-Minerals (MULTIVITAMIN WITH MINERALS) tablet Take 1 tablet by mouth daily.        . nitroGLYCERIN (NITROSTAT) 0.4 MG SL tablet Place 1 tablet (0.4 mg total) under the tongue every 5 (five) minutes as needed.  25 tablet  8  . omeprazole (PRILOSEC) 40 MG capsule Take 1 capsule (40 mg total) by mouth daily.  90 capsule  3  . potassium chloride (K-DUR) 10 MEQ tablet Take 1 tablet (10 mEq total) by mouth daily.  90 tablet  3  . simvastatin (ZOCOR) 40 MG tablet Take 1 tablet (40 mg total) by mouth at bedtime.   90 tablet  3  . [DISCONTINUED] hydrochlorothiazide (HYDRODIURIL) 12.5 MG tablet Take 1 tablet (12.5 mg total) by mouth daily.  90 tablet  3  . [DISCONTINUED] isosorbide mononitrate (IMDUR) 120 MG 24 hr tablet Take 1 tablet (120 mg total) by mouth daily.  90 tablet  3  . [DISCONTINUED] metoprolol (LOPRESSOR) 50 MG tablet Take 1 tablet (50 mg total) by mouth 2 (two) times daily.  180 tablet  3  . [DISCONTINUED] simvastatin (ZOCOR) 40 MG tablet Take 1 tablet (40 mg total) by mouth at bedtime.  90 tablet  3   No facility-administered encounter medications on file as of 07/18/2013.    No Known Allergies  Past Medical History  Diagnosis Date  . Exertional angina   . Coronary artery disease     single vessel of the LAD  . Normal echocardiogram     normal lv function  . Bundle branch block   . Lung nodule     lower  . Anemia     mild normocytic anemia  . Thrombocytopenia     platelet count 80,000  . Anxiety   . GERD (gastroesophageal reflux disease)    ROS: Negative except as per HPI  BP 131/80  Pulse 93  Ht 5\' 4"  (1.626 m)  Wt 57.607 kg (127 lb)  BMI 21.79 kg/m2  PHYSICAL EXAM: Pt is alert and oriented, NAD  HEENT: normal Neck: JVP - normal, carotids 2+= without bruits Lungs: CTA bilaterally CV: RRR without murmur or gallop Abd: soft, NT, Positive BS, no hepatomegaly Ext: no C/C/E, distal pulses intact and equal Skin: warm/dry no rash  Cardiac catheterization: 04/12/2013: Procedure: Right Heart Cath, Left Heart Cath, Selective Coronary Angiography, LV angiography, saphenous vein graft angiography, LIMA angiography.  Indication: CCS class III angina, shortness of breath, known coronary artery disease status post CABG.  Procedural Details: The right groin was prepped, draped, and anesthetized with 1% lidocaine. Using the modified Seldinger technique a 4 French sheath was placed in the right femoral artery and a 6 French sheath was placed in the right femoral vein. A  multipurpose catheter was used for the right heart catheterization. Standard protocol was followed for recording of right heart pressures and sampling of oxygen saturations. Fick cardiac output was calculated. Standard Judkins catheters were used for selective coronary angiography and left ventriculography. A 3 DRC catheter was used to image the saphenous vein graft to diagonal and LIMA to LAD.There were no immediate procedural complications. The patient was transferred to the post catheterization recovery area for further monitoring.  Procedural Findings:  Hemodynamics  RA mean of 3  RV 29/5  PA 28/9 with a mean of 18  PCWP mean of 4, V wave of 6  LV 103/8  AO 104/47 with a mean of 71  Oxygen saturations:  PA 75  AO 95  SVC 71  Cardiac Output (Fick) 6 L/min  Cardiac Index (Fick) 2.3 L/min/meter squared  Coronary angiography:  Coronary dominance: right  Left mainstem: The left main is widely patent without obstructive disease.  Left anterior descending (LAD): The LAD is diffusely diseased throughout the proximal and mid aspects of the vessel. There is mild associated calcification. The proximal vessel has tandem 50% lesions. The mid vessel has 75% stenosis that does not significantly change with intracoronary nitroglycerin. Further down in the mid vessel and distal LAD there is no significant obstructive disease. The diagonal branch is occluded and it fills from graft flow.  Left circumflex (LCx): The left circumflex is large in caliber. There are 2 obtuse marginals without significant disease. There is no obstructive disease throughout the course of the left circumflex distribution.  Right coronary artery (RCA): The right coronary artery is dominant. The vessel has 30% proximal stenosis and only minor irregularity through the mid vessel. The distal RCA is widely patent. The PDA and PLA branches are patent.  Saphenous vein graft to diagonal: The graft is diffusely small but there are no areas of  significant stenosis. There is diffuse mild irregularity. The distal anastomotic site has a 30-40% stenosis.  LIMA to LAD: Patent but atretic.  Left ventriculography: Left ventricular systolic function is normal, LVEF is estimated at 65%, there is no significant mitral regurgitation  Final Conclusions:  1. Moderately severe diffuse proximal LAD stenosis, not significantly changed from 2009.  2. Widely patent left circumflex and right coronary arteries  3. Status post aortocoronary bypass surgery with continued patency of the saphenous vein graft to diagonal, but atretic LIMA to LAD  4. Normal left ventricular systolic function  5. Normal cardiac hemodynamics  Recommendations: Continued medical therapy. The patient's films were carefully reviewed and compared with her previous studies in 2009. There is no significant change in the appearance of her LAD and I think her overall cardiac situation is stable. Will continue with her current medical therapy. Her films were reviewed with Dr. Riley Kill.   Echocardiogram 04/09/2013:  Study Conclusions  - Left ventricle: The cavity size was normal. Wall thickness was normal. Systolic function was normal. The estimated ejection fraction was in the range of 60% to 65%. Wall motion was normal; there were no regional wall motion abnormalities. Doppler parameters are consistent with abnormal left ventricular relaxation (grade 1 diastolic dysfunction). - Aortic valve: There was no stenosis. Trivial regurgitation. - Mitral valve: Mildly calcified annulus. Mild regurgitation. - Left atrium: The atrium was mildly dilated. - Right ventricle: The cavity size was normal. Systolic function was normal. - Tricuspid valve: Peak RV-RA gradient: 26mm Hg (S). - Pulmonary arteries: PA peak pressure: 31mm Hg (S). - Inferior vena cava: The vessel was normal in size; the respirophasic diameter changes were in the normal range (= 50%); findings are consistent with normal  central venous pressure. Impressions:  - Normal LV size and systolic function, EF 60-65%. Normal RV size and systolic function. Mild mitral regurgitation.  ASSESSMENT AND PLAN: 1. Coronary artery disease, native vessel. I would recommend continuation of her current medical therapy. She has CCS class 1-2 anginal symptoms which are unchanged over time. Her left ventricular function is preserved. No changes were made to her medical program today.  2. Hyperlipidemia. She tells me that she had been off of simvastatin for quite some time. Her lipids were recently checked by Dr. Sherril Croon and she was told her cholesterol is high. We will request a copy of her labs and start her back on simvastatin 40 mg. A statin drug is appropriate in the setting of her coronary artery disease.  3. Hypertension. Blood pressure is controlled on her current medical program which includes metoprolol, hydrochlorothiazide, and isosorbide.  Tonny Bollman 07/18/2013 1:51 PM

## 2014-03-22 ENCOUNTER — Ambulatory Visit (INDEPENDENT_AMBULATORY_CARE_PROVIDER_SITE_OTHER): Payer: Medicare Other | Admitting: Cardiovascular Disease

## 2014-03-22 ENCOUNTER — Encounter: Payer: Self-pay | Admitting: Cardiovascular Disease

## 2014-03-22 VITALS — BP 130/76 | HR 87 | Ht 64.0 in | Wt 128.0 lb

## 2014-03-22 DIAGNOSIS — E785 Hyperlipidemia, unspecified: Secondary | ICD-10-CM

## 2014-03-22 DIAGNOSIS — I2510 Atherosclerotic heart disease of native coronary artery without angina pectoris: Secondary | ICD-10-CM

## 2014-03-22 DIAGNOSIS — I10 Essential (primary) hypertension: Secondary | ICD-10-CM

## 2014-03-22 MED ORDER — POTASSIUM CHLORIDE ER 10 MEQ PO TBCR: 10.0000 meq | EXTENDED_RELEASE_TABLET | Freq: Every day | ORAL | Status: DC

## 2014-03-22 MED ORDER — METOPROLOL TARTRATE 50 MG PO TABS: 75.0000 mg | ORAL_TABLET | Freq: Two times a day (BID) | ORAL | Status: DC

## 2014-03-22 MED ORDER — SIMVASTATIN 40 MG PO TABS: 40.0000 mg | ORAL_TABLET | Freq: Every day | ORAL | Status: DC

## 2014-03-22 NOTE — Progress Notes (Signed)
HPI:  80 year old woman presenting for followup evaluation. She has a history of coronary artery disease status post CABG in 2009. She underwent a heart catheterization to evaluate hemodynamics and coronary anatomy in the setting of anginal symptoms and shortness of breath. Her right heart pressures and hemodynamics were normal. The LAD showed stable diffuse moderate proximal vessel stenosis unchanged from 2009. Her graft anatomy was stable with patency of the vein graft to diagonal and atresia of the LIMA graft.  The patient continues to complain of chest discomfort and shortness of breath with moderate level activity. She is able to do her basic household work without symptoms. However if she does anything more than that she describes a pressure like sensation in the center of her chest. This is been long-standing. This occurs with taking out the trash or walking a moderate distance or at a brisk pace. She's had no resting symptoms. There's been no progression of her chest pain or shortness of breath. She denies edema, palpitations, or lightheadedness.   Outpatient Encounter Prescriptions as of 03/22/2014  Medication Sig  . aspirin 81 MG tablet Take 81 mg by mouth daily.   . Calcium Carbonate-Vit D-Min 600-400 MG-UNIT TABS Take 1 tablet by mouth daily.    . hydrochlorothiazide (HYDRODIURIL) 12.5 MG tablet Take 1 tablet (12.5 mg total) by mouth daily.  . isosorbide mononitrate (IMDUR) 120 MG 24 hr tablet Take 1 tablet (120 mg total) by mouth daily.  . metoprolol (LOPRESSOR) 50 MG tablet Take 1.5 tablets (75 mg total) by mouth 2 (two) times daily.  . Multiple Vitamins-Minerals (MULTIVITAMIN WITH MINERALS) tablet Take 1 tablet by mouth daily.    . nitroGLYCERIN (NITROSTAT) 0.4 MG SL tablet Place 1 tablet (0.4 mg total) under the tongue every 5 (five) minutes as needed.  Marland Kitchen omeprazole (PRILOSEC) 40 MG capsule Take 1 capsule (40 mg total) by mouth daily.  . potassium chloride (K-DUR) 10 MEQ tablet  Take 1 tablet (10 mEq total) by mouth daily.  . simvastatin (ZOCOR) 40 MG tablet Take 1 tablet (40 mg total) by mouth at bedtime.  . [DISCONTINUED] metoprolol (LOPRESSOR) 50 MG tablet Take 1 tablet (50 mg total) by mouth 2 (two) times daily.    No Known Allergies  Past Medical History  Diagnosis Date  . Exertional angina   . Coronary artery disease     single vessel of the LAD  . Normal echocardiogram     normal lv function  . Bundle branch block   . Lung nodule     lower  . Anemia     mild normocytic anemia  . Thrombocytopenia     platelet count 80,000  . Anxiety   . GERD (gastroesophageal reflux disease)     ROS: Negative except as per HPI  BP 130/76  Pulse 87  Ht 5\' 4"  (1.626 m)  Wt 128 lb (58.06 kg)  BMI 21.96 kg/m2  PHYSICAL EXAM: Pt is alert and oriented, NAD HEENT: normal Neck: JVP - normal, carotids 2+= without bruits Lungs: CTA bilaterally CV: RRR without murmur or gallop Abd: soft, NT, Positive BS, no hepatomegaly Ext: no C/C/E, distal pulses intact and equal Skin: warm/dry no rash  EKG:  Normal sinus rhythm 95 beats per minute, left bundle branch block  ASSESSMENT AND PLAN: 1. Coronary atherosclerosis, native vessel. Most recent cardiac catheterization from 2014 was reviewed with findings as outlined above. The patient has CCS class 2-3 anginal symptoms. I am going to increase her metoprolol to 75 mg  twice daily. Will plan on seeing her back in 6 months. If symptoms progress she will notify us sooner. May consider stress testing when I see her back if there is no improvement in her angina on increased beta blocker.  2. Hyperlipidemia. Patient followed by her primary physician and takes simvastatin 40 mg.  3. Hypertension. Blood pressure controlled hydrochlorothiazide, Imdur, and metoprolol.  Sherren Mocha 03/22/2014 10:49 AM

## 2014-03-22 NOTE — Patient Instructions (Signed)
Your physician has recommended you make the following change in your medication: INCREASE Metoprolol Tartrate to 50mg  take one and one-half tablet by mouth twice a day  Your physician wants you to follow-up in: 6 MONTHS.  You will receive a reminder letter in the mail two months in advance. If you don't receive a letter, please call our office to schedule the follow-up appointment.

## 2014-04-18 ENCOUNTER — Ambulatory Visit: Payer: Medicare Other | Admitting: Cardiovascular Disease

## 2014-07-24 ENCOUNTER — Other Ambulatory Visit: Payer: Self-pay | Admitting: Cardiovascular Disease

## 2014-10-15 ENCOUNTER — Ambulatory Visit: Payer: Medicare Other | Admitting: Cardiovascular Disease

## 2014-12-19 ENCOUNTER — Encounter: Payer: Self-pay | Admitting: Cardiovascular Disease

## 2014-12-19 ENCOUNTER — Ambulatory Visit (INDEPENDENT_AMBULATORY_CARE_PROVIDER_SITE_OTHER): Payer: Medicare Other | Admitting: Cardiovascular Disease

## 2014-12-19 VITALS — BP 110/70 | HR 94 | Ht 64.0 in | Wt 131.4 lb

## 2014-12-19 DIAGNOSIS — I340 Nonrheumatic mitral (valve) insufficiency: Secondary | ICD-10-CM

## 2014-12-19 DIAGNOSIS — I10 Essential (primary) hypertension: Secondary | ICD-10-CM

## 2014-12-19 DIAGNOSIS — R079 Chest pain, unspecified: Secondary | ICD-10-CM

## 2014-12-19 DIAGNOSIS — I059 Rheumatic mitral valve disease, unspecified: Secondary | ICD-10-CM

## 2014-12-19 DIAGNOSIS — E785 Hyperlipidemia, unspecified: Secondary | ICD-10-CM

## 2014-12-19 LAB — HEPATIC FUNCTION PANEL
ALBUMIN: 4.1 g/dL (ref 3.5–5.2)
ALK PHOS: 65 U/L (ref 39–117)
ALT: 26 U/L (ref 0–35)
AST: 29 U/L (ref 0–37)
BILIRUBIN DIRECT: 0.1 mg/dL (ref 0.0–0.3)
Total Bilirubin: 0.6 mg/dL (ref 0.2–1.2)
Total Protein: 6.6 g/dL (ref 6.0–8.3)

## 2014-12-19 LAB — LIPID PANEL
CHOLESTEROL: 174 mg/dL (ref 0–200)
HDL: 30.1 mg/dL — ABNORMAL LOW (ref >39.00)
NONHDL: 143.9
TRIGLYCERIDES: 269 mg/dL — AB (ref 0.0–149.0)
Total CHOL/HDL Ratio: 6
VLDL: 53.8 mg/dL — AB (ref 0.0–40.0)

## 2014-12-19 LAB — LDL CHOLESTEROL, DIRECT: LDL DIRECT: 109 mg/dL

## 2014-12-19 NOTE — Patient Instructions (Signed)
Your physician recommends that you have lab work today: LIPID and LIVER  Your physician wants you to follow-up in: 6 MONTHS with Dr Burt Knack.  You will receive a reminder letter in the mail two months in advance. If you don't receive a letter, please call our office to schedule the follow-up appointment.  Your physician recommends that you continue on your current medications as directed. Please refer to the Current Medication list given to you today.

## 2014-12-19 NOTE — Progress Notes (Signed)
CARDIOLOGY OFFICE NOTE Date:  12/19/2014   ID:  [name] Graham, DOB [birth year], MRN [id number]  PCP:  Glenda Chroman., MD   Chief Complaint  Patient presents with  . Follow-up    SOB, WEAKNESS, SWELLING     HISTORY OF PRESENT ILLNESS: [name] Graham is a 81 y.o. female who presents today for cardiology evaluation.   She has a history of coronary artery disease status post CABG in 2009. She underwent a heart catheterization in 2014 to evaluate hemodynamics and coronary anatomy in the setting of anginal symptoms and shortness of breath. Her right heart pressures and hemodynamics were normal. The LAD showed stable diffuse moderate proximal vessel stenosis unchanged from 2009. Her graft anatomy was stable with patency of the vein graft to diagonal and atresia of the LIMA graft.  Complaints include leg swelling late in the day, especially after up on her feet a lot. Also complains of generalized weakness. She has chest pain with walking moderate distances, moving fast, or when she gets in a big hurry. She reports no change in anginal symptoms since I saw her last. DOE is also unchanged. No lightheadedness or syncope.  Past Medical History  Diagnosis Date  . Exertional angina   . Coronary artery disease     single vessel of the LAD  . Normal echocardiogram     normal lv function  . Bundle branch block   . Lung nodule     lower  . Anemia     mild normocytic anemia  . Thrombocytopenia     platelet count 80,000  . Anxiety   . GERD (gastroesophageal reflux disease)     Current Outpatient Prescriptions  Medication Sig Dispense Refill  . aspirin 81 MG tablet Take 81 mg by mouth daily.     . Calcium Carbonate-Vit D-Min 600-400 MG-UNIT TABS Take 1 tablet by mouth daily.      . hydrochlorothiazide (HYDRODIURIL) 12.5 MG tablet Take 1 tablet (12.5 mg total) by mouth daily. 90 tablet 3  . isosorbide mononitrate (IMDUR) 120 MG 24 hr tablet TAKE ONE TABLET BY MOUTH ONCE DAILY 90 tablet 0    . metoprolol (LOPRESSOR) 50 MG tablet Take 1.5 tablets (75 mg total) by mouth 2 (two) times daily. 270 tablet 3  . Multiple Vitamins-Minerals (MULTIVITAMIN WITH MINERALS) tablet Take 1 tablet by mouth daily.      . nitroGLYCERIN (NITROSTAT) 0.4 MG SL tablet Place 1 tablet (0.4 mg total) under the tongue every 5 (five) minutes as needed. 25 tablet 8  . omeprazole (PRILOSEC) 40 MG capsule Take 1 capsule (40 mg total) by mouth daily. 90 capsule 3  . potassium chloride (K-DUR) 10 MEQ tablet Take 1 tablet (10 mEq total) by mouth daily. 90 tablet 3  . simvastatin (ZOCOR) 40 MG tablet Take 1 tablet (40 mg total) by mouth at bedtime. 90 tablet 3   No current facility-administered medications for this visit.    ALLERGIES:   Review of patient's allergies indicates no known allergies.   SOCIAL HISTORY:  The patient  reports that she has never smoked. She does not have any smokeless tobacco history on file. She reports that she does not drink alcohol or use illicit drugs.   FAMILY HISTORY:  The patient's family history includes Heart failure in her father and mother.   REVIEW OF SYSTEMS:  Positive for appetite change, fatigue, and shortness of breath with activity.   All other systems are reviewed and negative.   PHYSICAL EXAM: VS:  BP 110/70 mmHg  Pulse 94  Ht 5\' 4"  (1.626 m)  Wt 131 lb 6.4 oz (59.603 kg)  BMI 22.54 kg/m2 , BMI Body mass index is 22.54 kg/(m^2). GEN: Well nourished, well developed, pleasant elderly woman in no acute distress HEENT: normal Neck: No JVD. carotids 2+ without bruits or masses Cardiac: The heart is RRR without murmurs, rubs, or gallops. No edema. Pedal pulses 2+ = bilaterally  Respiratory:  clear to auscultation bilaterally GI: soft, nontender, nondistended, + BS MS: no deformity or atrophy Skin: warm and dry, no rash Neuro:  Strength and sensation are intact Psych: euthymic mood, full affect  EKG:  EKG is ordered today. The ekg ordered today shows NSR, LBBB,  94 bpm  RECENT LABS: No results found for requested labs within last 365 days.  No results found for requested labs within last 365 days.   CrCl cannot be calculated (Patient has no serum creatinine result on file.).   Wt Readings from Last 3 Encounters:  12/19/14 131 lb 6.4 oz (59.603 kg)  03/22/14 128 lb (58.06 kg)  07/18/13 127 lb (57.607 kg)     CARDIAC STUDIES: Cardiac Cath 04/12/2013: Final Conclusions:  1. Moderately severe diffuse proximal LAD stenosis, not significantly changed from 2009.  2. Widely patent left circumflex and right coronary arteries  3. Status post aortocoronary bypass surgery with continued patency of the saphenous vein graft to diagonal, but atretic LIMA to LAD  4. Normal left ventricular systolic function  5. Normal cardiac hemodynamics   Recommendations: Continued medical therapy. The patient's films were carefully reviewed and compared with her previous studies in 2009. There is no significant change in the appearance of her LAD and I think her overall cardiac situation is stable. Will continue with her current medical therapy. Her films were reviewed with Dr. Lia Foyer.   ASSESSMENT AND PLAN: 1.  CAD, native vessel, with CCS Class 2 angina. She continues on ASA and a statin drug. She's on a good anti-anginal program with imdur and metoprolol. Dose escalation limited by fatigue and I would favor continuing with her current Rx. Reviewed pros and cons of cath/PCI versus medical therapy. Last cath study reviewed and shows diffuse prox LAD stenosis with an atretic LIMA. PCI could be considered, but the patient favors waiting and continuing med Rx. She was advised to call if any change in anginal pattern.  2. HTN - BP well-controlled on current Rx.  3. Hyperlipidemia: Last lipids 2014 reviewed. Now on simvastatin. Will order lipids and LFT's today.  Labs/ tests ordered today include:  Lipids and LFT's  Disposition:   FU with me in 6 months. No med  changes made today.  [name] Graham 12/19/2014 9:07 AM     [facility] [address] [place] [address]  [phone] (office) [phone] (fax)

## 2015-02-11 ENCOUNTER — Other Ambulatory Visit: Payer: Self-pay

## 2015-02-11 MED ORDER — ISOSORBIDE MONONITRATE ER 120 MG PO TB24: 120.0000 mg | ORAL_TABLET | Freq: Every day | ORAL | Status: DC

## 2015-02-11 MED ORDER — POTASSIUM CHLORIDE ER 10 MEQ PO TBCR: 10.0000 meq | EXTENDED_RELEASE_TABLET | Freq: Every day | ORAL | Status: DC

## 2015-02-11 MED ORDER — SIMVASTATIN 40 MG PO TABS: 40.0000 mg | ORAL_TABLET | Freq: Every day | ORAL | Status: DC

## 2015-02-11 MED ORDER — OMEPRAZOLE 40 MG PO CPDR: 40.0000 mg | DELAYED_RELEASE_CAPSULE | Freq: Every day | ORAL | Status: DC

## 2015-02-11 MED ORDER — HYDROCHLOROTHIAZIDE 12.5 MG PO CAPS: 12.5000 mg | ORAL_CAPSULE | Freq: Every day | ORAL | Status: DC

## 2015-03-24 ENCOUNTER — Encounter (HOSPITAL_COMMUNITY): Admission: RE | Payer: Self-pay | Source: Ambulatory Visit

## 2015-03-24 ENCOUNTER — Ambulatory Visit (HOSPITAL_COMMUNITY): Admission: RE | Admit: 2015-03-24 | Payer: Medicare Other | Source: Ambulatory Visit | Admitting: Ophthalmology

## 2015-03-24 SURGERY — TREATMENT, USING YAG LASER
Anesthesia: LOCAL | Laterality: Left

## 2015-05-29 ENCOUNTER — Ambulatory Visit (INDEPENDENT_AMBULATORY_CARE_PROVIDER_SITE_OTHER): Payer: Medicare Other | Admitting: Cardiovascular Disease

## 2015-05-29 ENCOUNTER — Encounter: Payer: Self-pay | Admitting: Cardiovascular Disease

## 2015-05-29 VITALS — BP 104/60 | HR 71 | Ht 64.0 in | Wt 126.0 lb

## 2015-05-29 DIAGNOSIS — I25119 Atherosclerotic heart disease of native coronary artery with unspecified angina pectoris: Secondary | ICD-10-CM

## 2015-05-29 DIAGNOSIS — I10 Essential (primary) hypertension: Secondary | ICD-10-CM

## 2015-05-29 DIAGNOSIS — R079 Chest pain, unspecified: Secondary | ICD-10-CM | POA: Diagnosis not present

## 2015-05-29 NOTE — Progress Notes (Signed)
Cardiology Office Note Date:  05/30/2015   ID:  CLELA HAGADORN, DOB 1934-05-11, MRN [id number]  PCP:  Glenda Chroman., MD  Cardiologist:  Sherren Mocha, MD    Chief Complaint  Patient presents with  . Chest Pain   History of Present Illness: [name] Graham is a 81 y.o. female who presents for follow-up evaluation.   She has a history of coronary artery disease status post CABG in 2009. She underwent a heart catheterization in 2014 to evaluate hemodynamics and coronary anatomy in the setting of anginal symptoms and shortness of breath. Her right heart pressures and hemodynamics were normal. The LAD showed stable diffuse moderate proximal vessel stenosis unchanged from 2009. Her graft anatomy was stable with patency of the vein graft to diagonal and atresia of the LIMA graft.  She hasn't been doing well. Having a lot of chest pain. She experiences substernal pressure with walking to her mailbox. This eases with rest. Also with exertional dyspnea. She has occasional chest pain at rest. She generally takes ASA and this helps with her pain. No orthopnea, PND, lightheadedness, or syncope. She's compliant with her medicines. Denies bleeding problems.  Past Medical History  Diagnosis Date  . Exertional angina   . Coronary artery disease     single vessel of the LAD  . Normal echocardiogram     normal lv function  . Bundle branch block   . Lung nodule     lower  . Anemia     mild normocytic anemia  . Thrombocytopenia     platelet count 80,000  . Anxiety   . GERD (gastroesophageal reflux disease)     Past Surgical History  Procedure Laterality Date  . Coronary artery bypass graft  11/04/2008      Ivin Poot, M.D.    Current Outpatient Prescriptions  Medication Sig Dispense Refill  . aspirin 81 MG tablet Take 81 mg by mouth daily.     . Calcium Carbonate-Vit D-Min 600-400 MG-UNIT TABS Take 1 tablet by mouth daily.      . clonazePAM (KLONOPIN) 0.5 MG tablet Take 1  tablet by mouth at bedtime.    . hydrochlorothiazide (MICROZIDE) 12.5 MG capsule Take 1 capsule (12.5 mg total) by mouth daily. 90 capsule 3  . isosorbide mononitrate (IMDUR) 120 MG 24 hr tablet Take 1 tablet (120 mg total) by mouth daily. 90 tablet 3  . metoprolol (LOPRESSOR) 50 MG tablet Take 1.5 tablets (75 mg total) by mouth 2 (two) times daily. 270 tablet 3  . Multiple Vitamins-Minerals (MULTIVITAMIN WITH MINERALS) tablet Take 1 tablet by mouth daily.      . nitroGLYCERIN (NITROSTAT) 0.4 MG SL tablet Place 1 tablet (0.4 mg total) under the tongue every 5 (five) minutes as needed. 25 tablet 8  . omeprazole (PRILOSEC) 40 MG capsule Take 1 capsule (40 mg total) by mouth daily. 90 capsule 3  . potassium chloride (K-DUR) 10 MEQ tablet Take 1 tablet (10 mEq total) by mouth daily. 90 tablet 3  . simvastatin (ZOCOR) 40 MG tablet Take 1 tablet (40 mg total) by mouth at bedtime. 90 tablet 3   No current facility-administered medications for this visit.    Allergies:   Review of patient's allergies indicates no known allergies.   Social History:  The patient  reports that she has never smoked. She does not have any smokeless tobacco history on file. She reports that she does not drink alcohol or use illicit drugs.   Family History:  The patient's  family history includes Heart failure in her father and mother.    ROS:  Please see the history of present illness.  Otherwise, review of systems is positive for chest pain, DOE, easy bruising.  All other systems are reviewed and negative.    PHYSICAL EXAM: VS:  BP 104/60 mmHg  Pulse 71  Ht 5\' 4"  (1.626 m)  Wt 126 lb (57.153 kg)  BMI 21.62 kg/m2 , BMI Body mass index is 21.62 kg/(m^2). GEN: Well nourished, well developed, pleasant elderly woman in no acute distress HEENT: normal Neck: no JVD, no masses. No carotid bruits Cardiac: RRR without murmur or gallop                Respiratory:  clear to auscultation bilaterally, normal work of  breathing GI: soft, nontender, nondistended, + BS MS: no deformity or atrophy Ext: no pretibial edema, pedal pulses 2+= bilaterally Skin: warm and dry, no rash Neuro:  Strength and sensation are intact Psych: euthymic mood, full affect  EKG:  EKG is ordered today. The ekg ordered today shows NSR with LBBB unchanged from previous  Recent Labs: 12/19/2014: ALT 26   Lipid Panel     Component Value Date/Time   CHOL 174 12/19/2014 0941   TRIG 269.0* 12/19/2014 0941   HDL 30.10* 12/19/2014 0941   CHOLHDL 6 12/19/2014 0941   VLDL 53.8* 12/19/2014 0941   LDLCALC * 10/30/2008 0610    118        Total Cholesterol/HDL:CHD Risk Coronary Heart Disease Risk Table                     Men   Women  1/2 Average Risk   3.4   3.3   LDLDIRECT 109.0 12/19/2014 0941      Wt Readings from Last 3 Encounters:  05/29/15 126 lb (57.153 kg)  12/19/14 131 lb 6.4 oz (59.603 kg)  03/22/14 128 lb (58.06 kg)     Cardiac Studies Reviewed: Last cath reviewed  ASSESSMENT AND PLAN: 1.  CAD, native vessel, with CCS Class 3 angina on maximal medical therapy. I have reviewed her cath images from 2014 demonstrating moderate-severe LAD stenosis with an atretic LIMA graft. Favor repeat cath and possible PCI.   I have reviewed the risks, indications, and alternatives to cardiac catheterization and possible angioplasty/stent with the patient. Risks include but are not limited to bleeding, infection, vascular injury, stroke, myocardial infection, arrhythmia, kidney injury, radiation-related injury in the case of prolonged fluoroscopy use, emergency cardiac surgery, and death. The patient understands the risks of serious complication is low (<4%).   2. HTN: BP controlled on current Rx  3. Hyperlipidemia: continues on simvastatin  Current medicines are reviewed with the patient today.  The patient does not have concerns regarding medicines.  Labs/ tests ordered today include:   Orders Placed This Encounter   Procedures  . EKG 12-Lead   Disposition:   FU pending cath results  Signed, Sherren Mocha, MD  05/30/2015 [facility] [place], [place], [place]  [address] Phone: [phone]; Fax: [phone]

## 2015-05-29 NOTE — Patient Instructions (Signed)
Medication Instructions:  Your physician recommends that you continue on your current medications as directed. Please refer to the Current Medication list given to you today.  Labwork: No new orders.   Testing/Procedures: Your physician has requested that you have a cardiac catheterization. Cardiac catheterization is used to diagnose and/or treat various heart conditions. Doctors may recommend this procedure for a number of different reasons. The most common reason is to evaluate chest pain. Chest pain can be a symptom of coronary artery disease (CAD), and cardiac catheterization can show whether plaque is narrowing or blocking your heart's arteries. This procedure is also used to evaluate the valves, as well as measure the blood flow and oxygen levels in different parts of your heart. For further information please visit [email or web address]. Please follow instruction sheet, as given.  Please call the office next week at [phone] to discuss scheduling procedure, leave a message for Cantrell Larouche  Dr Burt Knack is working in the hospital 06/05/15, 06/06/15, 06/12/15 and 06/13/15  Follow-Up: We will arrange further follow-up after your cardiac catheterization.   Any Other Special Instructions Will Be Listed Below (If Applicable).

## 2015-06-03 ENCOUNTER — Encounter: Payer: Self-pay | Admitting: Cardiovascular Disease

## 2015-06-03 ENCOUNTER — Other Ambulatory Visit: Payer: Self-pay

## 2015-06-03 DIAGNOSIS — I10 Essential (primary) hypertension: Secondary | ICD-10-CM

## 2015-06-03 DIAGNOSIS — I25118 Atherosclerotic heart disease of native coronary artery with other forms of angina pectoris: Secondary | ICD-10-CM

## 2015-06-03 DIAGNOSIS — I25119 Atherosclerotic heart disease of native coronary artery with unspecified angina pectoris: Secondary | ICD-10-CM

## 2015-06-03 DIAGNOSIS — E785 Hyperlipidemia, unspecified: Secondary | ICD-10-CM

## 2015-06-03 MED ORDER — METOPROLOL TARTRATE 50 MG PO TABS: 75.0000 mg | ORAL_TABLET | Freq: Two times a day (BID) | ORAL | Status: DC

## 2015-06-03 MED ORDER — NITROGLYCERIN 0.4 MG SL SUBL: 0.4000 mg | SUBLINGUAL_TABLET | SUBLINGUAL | Status: DC | PRN

## 2015-06-03 NOTE — Telephone Encounter (Signed)
New Message   Pt calling in to speak to Rn

## 2015-06-03 NOTE — Telephone Encounter (Signed)
New Message   Pt want Rn to return her call please, thank you

## 2015-06-03 NOTE — Telephone Encounter (Signed)
F/u ° ° °Pt returning your call °

## 2015-06-03 NOTE — Telephone Encounter (Signed)
I spoke with the pt and she would like to schedule cardiac catheterization on 06/12/15.  Pre-procedure instructions given to the pt by phone and copy mailed to the pt's home.

## 2015-06-12 ENCOUNTER — Encounter (HOSPITAL_COMMUNITY)
Admission: RE | Disposition: A | Discharge status: Home / Self Care | Payer: Self-pay | Source: Ambulatory Visit | Attending: Cardiovascular Disease

## 2015-06-12 ENCOUNTER — Ambulatory Visit (HOSPITAL_COMMUNITY)
Admission: RE | Admit: 2015-06-12 | Discharge: 2015-06-12 | Disposition: A | Discharge status: Home / Self Care | Payer: Medicare Other | Source: Ambulatory Visit | Attending: Cardiovascular Disease | Admitting: Cardiovascular Disease

## 2015-06-12 DIAGNOSIS — E785 Hyperlipidemia, unspecified: Secondary | ICD-10-CM | POA: Insufficient documentation

## 2015-06-12 DIAGNOSIS — Z7982 Long term (current) use of aspirin: Secondary | ICD-10-CM | POA: Insufficient documentation

## 2015-06-12 DIAGNOSIS — K219 Gastro-esophageal reflux disease without esophagitis: Secondary | ICD-10-CM | POA: Diagnosis not present

## 2015-06-12 DIAGNOSIS — I10 Essential (primary) hypertension: Secondary | ICD-10-CM | POA: Insufficient documentation

## 2015-06-12 DIAGNOSIS — D696 Thrombocytopenia, unspecified: Secondary | ICD-10-CM | POA: Diagnosis not present

## 2015-06-12 DIAGNOSIS — D649 Anemia, unspecified: Secondary | ICD-10-CM | POA: Insufficient documentation

## 2015-06-12 DIAGNOSIS — I25709 Atherosclerosis of coronary artery bypass graft(s), unspecified, with unspecified angina pectoris: Secondary | ICD-10-CM | POA: Diagnosis not present

## 2015-06-12 DIAGNOSIS — I209 Angina pectoris, unspecified: Secondary | ICD-10-CM | POA: Diagnosis present

## 2015-06-12 DIAGNOSIS — F419 Anxiety disorder, unspecified: Secondary | ICD-10-CM | POA: Diagnosis not present

## 2015-06-12 DIAGNOSIS — I2510 Atherosclerotic heart disease of native coronary artery without angina pectoris: Secondary | ICD-10-CM | POA: Diagnosis not present

## 2015-06-12 DIAGNOSIS — I454 Nonspecific intraventricular block: Secondary | ICD-10-CM | POA: Diagnosis not present

## 2015-06-12 HISTORY — PX: CARDIAC CATHETERIZATION: SHX172

## 2015-06-12 LAB — BASIC METABOLIC PANEL
Anion gap: 9 (ref 5–15)
BUN: 18 mg/dL (ref 6–20)
CALCIUM: 9.2 mg/dL (ref 8.9–10.3)
CHLORIDE: 108 mmol/L (ref 101–111)
CO2: 22 mmol/L (ref 22–32)
CREATININE: 0.96 mg/dL (ref 0.44–1.00)
GFR calc Af Amer: >60 mL/min (ref >60)
GFR calc non Af Amer: 55 mL/min — ABNORMAL LOW (ref >60)
GLUCOSE: 134 mg/dL — AB (ref 65–99)
Potassium: 4.2 mmol/L (ref 3.5–5.1)
Sodium: 139 mmol/L (ref 135–145)

## 2015-06-12 LAB — CBC
HCT: 38.4 % (ref 36.0–46.0)
Hemoglobin: 13.5 g/dL (ref 12.0–15.0)
MCH: 30.7 pg (ref 26.0–34.0)
MCHC: 35.2 g/dL (ref 30.0–36.0)
MCV: 87.3 fL (ref 78.0–100.0)
Platelets: 117 10*3/uL — ABNORMAL LOW (ref 150–400)
RBC: 4.4 MIL/uL (ref 3.87–5.11)
RDW: 13.5 % (ref 11.5–15.5)
WBC: 6.4 10*3/uL (ref 4.0–10.5)

## 2015-06-12 LAB — PROTIME-INR
INR: 0.95 (ref 0.00–1.49)
Prothrombin Time: 12.9 seconds (ref 11.6–15.2)

## 2015-06-12 SURGERY — LEFT HEART CATH AND CORS/GRAFTS ANGIOGRAPHY
Anesthesia: LOCAL

## 2015-06-12 MED ORDER — NITROGLYCERIN 1 MG/10 ML FOR IR/CATH LAB
INTRA_ARTERIAL | Status: AC
  Filled 2015-06-12: qty 10

## 2015-06-12 MED ORDER — MIDAZOLAM HCL 2 MG/2ML IJ SOLN
INTRAMUSCULAR | Status: AC
  Filled 2015-06-12: qty 2

## 2015-06-12 MED ORDER — SODIUM CHLORIDE 0.9 % IV SOLN: INTRAVENOUS | Status: AC

## 2015-06-12 MED ORDER — MIDAZOLAM HCL 2 MG/2ML IJ SOLN
INTRAMUSCULAR | Status: DC | PRN
  Administered 2015-06-12 (×2): 2 mg via INTRAVENOUS
  Administered 2015-06-12: 1 mg via INTRAVENOUS

## 2015-06-12 MED ORDER — SODIUM CHLORIDE 0.9 % IJ SOLN: 3.0000 mL | INTRAMUSCULAR | Status: DC | PRN

## 2015-06-12 MED ORDER — ACETAMINOPHEN 325 MG PO TABS: 650.0000 mg | ORAL_TABLET | ORAL | Status: DC | PRN

## 2015-06-12 MED ORDER — LIDOCAINE HCL (PF) 1 % IJ SOLN
INTRAMUSCULAR | Status: AC
  Filled 2015-06-12: qty 30

## 2015-06-12 MED ORDER — NITROGLYCERIN 1 MG/10 ML FOR IR/CATH LAB
INTRA_ARTERIAL | Status: DC | PRN
  Administered 2015-06-12: 11:00:00

## 2015-06-12 MED ORDER — SODIUM CHLORIDE 0.9 % WEIGHT BASED INFUSION
3.0000 mL/kg/h | INTRAVENOUS | Status: AC
Start: 2015-06-12 — End: 2015-06-12
  Administered 2015-06-12: 3 mL/kg/h via INTRAVENOUS

## 2015-06-12 MED ORDER — ASPIRIN 81 MG PO CHEW
CHEWABLE_TABLET | ORAL | Status: AC
  Administered 2015-06-12: 81 mg via ORAL
  Filled 2015-06-12: qty 1

## 2015-06-12 MED ORDER — SODIUM CHLORIDE 0.9 % IV SOLN: 250.0000 mL | INTRAVENOUS | Status: DC | PRN

## 2015-06-12 MED ORDER — SODIUM CHLORIDE 0.9 % WEIGHT BASED INFUSION: 1.0000 mL/kg/h | INTRAVENOUS | Status: DC

## 2015-06-12 MED ORDER — FENTANYL CITRATE (PF) 100 MCG/2ML IJ SOLN
INTRAMUSCULAR | Status: DC | PRN
  Administered 2015-06-12 (×2): 25 ug via INTRAVENOUS
  Administered 2015-06-12: 50 ug via INTRAVENOUS

## 2015-06-12 MED ORDER — IOHEXOL 350 MG/ML SOLN
INTRAVENOUS | Status: DC | PRN
  Administered 2015-06-12: 80 mL via INTRACARDIAC

## 2015-06-12 MED ORDER — SODIUM CHLORIDE 0.9 % IJ SOLN: 3.0000 mL | Freq: Two times a day (BID) | INTRAMUSCULAR | Status: DC

## 2015-06-12 MED ORDER — HEPARIN (PORCINE) IN NACL 2-0.9 UNIT/ML-% IJ SOLN
INTRAMUSCULAR | Status: AC
  Filled 2015-06-12: qty 1000

## 2015-06-12 MED ORDER — ONDANSETRON HCL 4 MG/2ML IJ SOLN: 4.0000 mg | Freq: Four times a day (QID) | INTRAMUSCULAR | Status: DC | PRN

## 2015-06-12 MED ORDER — FENTANYL CITRATE (PF) 100 MCG/2ML IJ SOLN
INTRAMUSCULAR | Status: AC
Start: 2015-06-12 — End: 2015-06-12
  Filled 2015-06-12: qty 2

## 2015-06-12 MED ORDER — ASPIRIN 81 MG PO CHEW
81.0000 mg | CHEWABLE_TABLET | ORAL | Status: AC
  Administered 2015-06-12: 81 mg via ORAL

## 2015-06-12 SURGICAL SUPPLY — 15 items
CATH INFINITI 5 FR 3DRC (CATHETERS) ×2 IMPLANT
CATH INFINITI 5 FR JL3.5 (CATHETERS) IMPLANT
CATH INFINITI 5FR ANG PIGTAIL (CATHETERS) IMPLANT
CATH INFINITI 5FR MULTPACK ANG (CATHETERS) ×2 IMPLANT
CATH INFINITI JR4 5F (CATHETERS) IMPLANT
DEVICE RAD COMP TR BAND LRG (VASCULAR PRODUCTS) IMPLANT
GLIDESHEATH SLEND SS 6F .021 (SHEATH) ×2 IMPLANT
KIT HEART LEFT (KITS) ×2 IMPLANT
PACK CARDIAC CATHETERIZATION (CUSTOM PROCEDURE TRAY) ×2 IMPLANT
SHEATH PINNACLE 5F 10CM (SHEATH) ×2 IMPLANT
SYR MEDRAD MARK V 150ML (SYRINGE) ×2 IMPLANT
TRANSDUCER W/STOPCOCK (MISCELLANEOUS) ×2 IMPLANT
TUBING CIL FLEX 10 FLL-RA (TUBING) ×2 IMPLANT
WIRE EMERALD 3MM-J .035X150CM (WIRE) ×2 IMPLANT
WIRE SAFE-T 1.5MM-J .035X260CM (WIRE) IMPLANT

## 2015-06-12 NOTE — Progress Notes (Signed)
Pulled 5Fr sheath after explaining to pt, pt still drowsy  From sedation Held maual pressure for 20 minutes. No bleeding or hematoma noted so tegaderm dressing applied. Pt still drowsy so reinforce groin care.

## 2015-06-12 NOTE — Discharge Instructions (Signed)
Arteriogram °Care After °These instructions give you information on caring for yourself after your procedure. Your doctor may also give you more specific instructions. Call your doctor if you have any problems or questions after your procedure. °HOME CARE °· Do not bathe, swim, or use a hot tub until directed by your doctor. You can shower. °· Do not lift anything heavier than 10 pounds (about a gallon of milk) for 2 days. °· Do not walk a lot, run, or drive for 2 days. °· Return to normal activities in 2 days or as told by your doctor. °Finding out the results of your test °Ask when your test results will be ready. Make sure you get your test results. °GET HELP RIGHT AWAY IF:  °· You have fever. °· You have more pain in your leg. °· The leg that was cut is: °¨ Bleeding. °¨ Puffy (swollen) or red. °¨ Cold. °¨ Pale or changes color. °¨ Weak. °¨ Tingly or numb. °If you go to the Emergency Room, tell your nurse that you have had an arteriogram. Take this paper with you to show the nurse. °MAKE SURE YOU: °· Understand these instructions. °· Will watch your condition. °· Will get help right away if you are not doing well or get worse. °Document Released: 02/18/2009 Document Revised: 11/27/2013 Document Reviewed: 02/18/2009 °ExitCare® Patient Information ©2015 [employer]. This information is not intended to replace advice given to you by your health care provider. Make sure you discuss any questions you have with your health care provider. ° °

## 2015-06-12 NOTE — Progress Notes (Signed)
OOB to BR without complaint. No signs/symptoms of bleeding. RT groin site level 0.

## 2015-06-12 NOTE — Interval H&P Note (Signed)
History and Physical Interval Note:  06/12/2015 10:32 AM  [name] Graham  has presented today for surgery, with the diagnosis of c/p  The various methods of treatment have been discussed with the patient and family. After consideration of risks, benefits and other options for treatment, the patient has consented to  Procedure(s): Left Heart Cath and Cors/Grafts Angiography (N/A) as a surgical intervention .  The patient's history has been reviewed, patient examined, no change in status, stable for surgery.  I have reviewed the patient's chart and labs.  Questions were answered to the patient's satisfaction.    Cath Lab Visit (complete for each Cath Lab visit)  Clinical Evaluation Leading to the Procedure:   ACS: No.  Non-ACS:    Anginal Classification: CCS III  Anti-ischemic medical therapy: Maximal Therapy (2 or more classes of medications)  Non-Invasive Test Results: No non-invasive testing performed  Prior CABG: Previous CABG       Sherren Mocha

## 2015-06-12 NOTE — H&P (View-Only) (Signed)
Cardiology Office Note Date:  05/30/2015   ID:  [name] Graham, DOB 29-Apr-1934, MRN [id number]  PCP:  Glenda Chroman., MD  Cardiologist:  Sherren Mocha, MD    Chief Complaint  Patient presents with  . Chest Pain   History of Present Illness: [name] Graham is a 81 y.o. female who presents for follow-up evaluation.   She has a history of coronary artery disease status post CABG in 2009. She underwent a heart catheterization in 2014 to evaluate hemodynamics and coronary anatomy in the setting of anginal symptoms and shortness of breath. Her right heart pressures and hemodynamics were normal. The LAD showed stable diffuse moderate proximal vessel stenosis unchanged from 2009. Her graft anatomy was stable with patency of the vein graft to diagonal and atresia of the LIMA graft.  She hasn't been doing well. Having a lot of chest pain. She experiences substernal pressure with walking to her mailbox. This eases with rest. Also with exertional dyspnea. She has occasional chest pain at rest. She generally takes ASA and this helps with her pain. No orthopnea, PND, lightheadedness, or syncope. She's compliant with her medicines. Denies bleeding problems.  Past Medical History  Diagnosis Date  . Exertional angina   . Coronary artery disease     single vessel of the LAD  . Normal echocardiogram     normal lv function  . Bundle branch block   . Lung nodule     lower  . Anemia     mild normocytic anemia  . Thrombocytopenia     platelet count 80,000  . Anxiety   . GERD (gastroesophageal reflux disease)     Past Surgical History  Procedure Laterality Date  . Coronary artery bypass graft  11/04/2008      [name] Graham, M.D.    Current Outpatient Prescriptions  Medication Sig Dispense Refill  . aspirin 81 MG tablet Take 81 mg by mouth daily.     . Calcium Carbonate-Vit D-Min 600-400 MG-UNIT TABS Take 1 tablet by mouth daily.      . clonazePAM (KLONOPIN) 0.5 MG tablet Take 1  tablet by mouth at bedtime.    . hydrochlorothiazide (MICROZIDE) 12.5 MG capsule Take 1 capsule (12.5 mg total) by mouth daily. 90 capsule 3  . isosorbide mononitrate (IMDUR) 120 MG 24 hr tablet Take 1 tablet (120 mg total) by mouth daily. 90 tablet 3  . metoprolol (LOPRESSOR) 50 MG tablet Take 1.5 tablets (75 mg total) by mouth 2 (two) times daily. 270 tablet 3  . Multiple Vitamins-Minerals (MULTIVITAMIN WITH MINERALS) tablet Take 1 tablet by mouth daily.      . nitroGLYCERIN (NITROSTAT) 0.4 MG SL tablet Place 1 tablet (0.4 mg total) under the tongue every 5 (five) minutes as needed. 25 tablet 8  . omeprazole (PRILOSEC) 40 MG capsule Take 1 capsule (40 mg total) by mouth daily. 90 capsule 3  . potassium chloride (K-DUR) 10 MEQ tablet Take 1 tablet (10 mEq total) by mouth daily. 90 tablet 3  . simvastatin (ZOCOR) 40 MG tablet Take 1 tablet (40 mg total) by mouth at bedtime. 90 tablet 3   No current facility-administered medications for this visit.    Allergies:   Review of patient's allergies indicates no known allergies.   Social History:  The patient  reports that she has never smoked. She does not have any smokeless tobacco history on file. She reports that she does not drink alcohol or use illicit drugs.   Family History:  The patient's  family history includes Heart failure in her father and mother.    ROS:  Please see the history of present illness.  Otherwise, review of systems is positive for chest pain, DOE, easy bruising.  All other systems are reviewed and negative.    PHYSICAL EXAM: VS:  BP 104/60 mmHg  Pulse 71  Ht 5\' 4"  (1.626 m)  Wt 126 lb (57.153 kg)  BMI 21.62 kg/m2 , BMI Body mass index is 21.62 kg/(m^2). GEN: Well nourished, well developed, pleasant elderly woman in no acute distress HEENT: normal Neck: no JVD, no masses. No carotid bruits Cardiac: RRR without murmur or gallop                Respiratory:  clear to auscultation bilaterally, normal work of  breathing GI: soft, nontender, nondistended, + BS MS: no deformity or atrophy Ext: no pretibial edema, pedal pulses 2+= bilaterally Skin: warm and dry, no rash Neuro:  Strength and sensation are intact Psych: euthymic mood, full affect  EKG:  EKG is ordered today. The ekg ordered today shows NSR with LBBB unchanged from previous  Recent Labs: 12/19/2014: ALT 26   Lipid Panel     Component Value Date/Time   CHOL 174 12/19/2014 0941   TRIG 269.0* 12/19/2014 0941   HDL 30.10* 12/19/2014 0941   CHOLHDL 6 12/19/2014 0941   VLDL 53.8* 12/19/2014 0941   LDLCALC * 10/30/2008 0610    118        Total Cholesterol/HDL:CHD Risk Coronary Heart Disease Risk Table                     Men   Women  1/2 Average Risk   3.4   3.3   LDLDIRECT 109.0 12/19/2014 0941      Wt Readings from Last 3 Encounters:  05/29/15 126 lb (57.153 kg)  12/19/14 131 lb 6.4 oz (59.603 kg)  03/22/14 128 lb (58.06 kg)     Cardiac Studies Reviewed: Last cath reviewed  ASSESSMENT AND PLAN: 1.  CAD, native vessel, with CCS Class 3 angina on maximal medical therapy. I have reviewed her cath images from 2014 demonstrating moderate-severe LAD stenosis with an atretic LIMA graft. Favor repeat cath and possible PCI.   I have reviewed the risks, indications, and alternatives to cardiac catheterization and possible angioplasty/stent with the patient. Risks include but are not limited to bleeding, infection, vascular injury, stroke, myocardial infection, arrhythmia, kidney injury, radiation-related injury in the case of prolonged fluoroscopy use, emergency cardiac surgery, and death. The patient understands the risks of serious complication is low (<9%).   2. HTN: BP controlled on current Rx  3. Hyperlipidemia: continues on simvastatin  Current medicines are reviewed with the patient today.  The patient does not have concerns regarding medicines.  Labs/ tests ordered today include:   Orders Placed This Encounter   Procedures  . EKG 12-Lead   Disposition:   FU pending cath results  Signed, Sherren Mocha, MD  05/30/2015 [facility] [place], [place], [place]  [address] Phone: [phone]; Fax: [phone]

## 2015-06-13 ENCOUNTER — Encounter (HOSPITAL_COMMUNITY): Payer: Self-pay | Admitting: Cardiovascular Disease

## 2015-12-23 ENCOUNTER — Telehealth: Payer: Self-pay | Admitting: Cardiovascular Disease

## 2015-12-23 NOTE — Telephone Encounter (Signed)
Pt well-known to me. Will arrange FU visit with labs and XRay. These sx's are chronic - underwent cath in 2016 for similar issues.  Sherren Mocha 12/23/2015 11:36 AM

## 2015-12-23 NOTE — Telephone Encounter (Signed)
I spoke with the pt and she just wants to make an appointment with Dr Burt Knack for follow-up as she did not come back into the office after her cardiac catheterization in July 2016.  I have scheduled her for an appointment on 03/08/16 and will contact her if an earlier appointment becomes available. The pt appreciated my call.

## 2015-12-23 NOTE — Telephone Encounter (Signed)
Pt c/o Shortness Of Breath: STAT if SOB developed within the last 24 hours or pt is noticeably SOB on the phone  1. Are you currently SOB (can you hear that pt is SOB on the phone)? Yes   2. How long have you been experiencing SOB? n/a  3. Are you SOB when sitting or when up moving around? Moving around  4. Are you currently experiencing any other symptoms? N/a

## 2015-12-23 NOTE — Telephone Encounter (Signed)
Patient called to schedule a check-up with Dr.Cooper to evaluate her SOB.  Ms. Diekman st this has been an ongoing issue since her CABG in 2009, but the episodes are becoming more frequent and lasting longer. She st she occasionally will get a "twinge" of CP during these episodes as well, but not lately.  Patient reports no other symptoms, but is audibly SOB on the phone. She denies CP, swelling, weight gain. She has no vital signs to report. Ms. Morgenstern reports "she feels great other than her shortness of breath." She requests an OV with Dr. Burt Knack along with lab work and a chest xray.  Offered an ASAP appointment with an APP, but patient politely refused. Informed patient that Dr. Burt Knack and his nurse will be made aware of symptoms and will call her if a sooner appointment can be made. Ms. Svetlik understands to go to the ED if her breathing worsens.

## 2016-01-05 NOTE — Telephone Encounter (Signed)
I have attempted to reach the pt a number of times on all of the numbers listed in her chart and have been unable to speak with her in regards to arranging earlier appointment.

## 2016-02-12 ENCOUNTER — Other Ambulatory Visit: Payer: Self-pay

## 2016-02-12 ENCOUNTER — Ambulatory Visit
Admission: RE | Admit: 2016-02-12 | Discharge: 2016-02-12 | Disposition: A | Discharge status: Home / Self Care | Payer: Medicare Other | Source: Ambulatory Visit

## 2016-02-12 DIAGNOSIS — Z1231 Encounter for screening mammogram for malignant neoplasm of breast: Secondary | ICD-10-CM

## 2016-02-13 NOTE — Telephone Encounter (Signed)
The pt has a pending appointment with Dr Burt Knack on 03/08/16.

## 2016-03-08 ENCOUNTER — Ambulatory Visit: Payer: Medicare Other | Admitting: Cardiovascular Disease

## 2016-05-11 ENCOUNTER — Encounter: Payer: Self-pay | Admitting: Cardiovascular Disease

## 2016-05-11 ENCOUNTER — Encounter (INDEPENDENT_AMBULATORY_CARE_PROVIDER_SITE_OTHER): Payer: Self-pay

## 2016-05-11 ENCOUNTER — Ambulatory Visit (INDEPENDENT_AMBULATORY_CARE_PROVIDER_SITE_OTHER): Payer: Medicare Other | Admitting: Cardiovascular Disease

## 2016-05-11 VITALS — BP 120/66 | HR 54 | Ht 64.0 in | Wt 123.4 lb

## 2016-05-11 DIAGNOSIS — E785 Hyperlipidemia, unspecified: Secondary | ICD-10-CM | POA: Diagnosis not present

## 2016-05-11 DIAGNOSIS — I25119 Atherosclerotic heart disease of native coronary artery with unspecified angina pectoris: Secondary | ICD-10-CM | POA: Diagnosis not present

## 2016-05-11 MED ORDER — NITROGLYCERIN 0.4 MG SL SUBL: 0.4000 mg | SUBLINGUAL_TABLET | SUBLINGUAL | Status: DC | PRN

## 2016-05-11 MED ORDER — SIMVASTATIN 40 MG PO TABS: 40.0000 mg | ORAL_TABLET | Freq: Every day | ORAL | Status: DC

## 2016-05-11 MED ORDER — ISOSORBIDE MONONITRATE ER 120 MG PO TB24: 120.0000 mg | ORAL_TABLET | Freq: Every day | ORAL | Status: DC

## 2016-05-11 MED ORDER — OMEPRAZOLE 40 MG PO CPDR: 40.0000 mg | DELAYED_RELEASE_CAPSULE | Freq: Every day | ORAL | Status: DC

## 2016-05-11 MED ORDER — HYDROCHLOROTHIAZIDE 12.5 MG PO CAPS: 12.5000 mg | ORAL_CAPSULE | Freq: Every day | ORAL | Status: DC

## 2016-05-11 MED ORDER — POTASSIUM CHLORIDE ER 10 MEQ PO TBCR: 10.0000 meq | EXTENDED_RELEASE_TABLET | Freq: Every day | ORAL | Status: DC

## 2016-05-11 NOTE — Progress Notes (Signed)
Cardiology Office Note Date:  05/11/2016   ID:  [name] Graham, DOB Jan 24, 1934, MRN [id number]  PCP:  Glenda Chroman, MD  Cardiologist:  Sherren Mocha, MD    Chief Complaint  Patient presents with  . valvular disease    sob w/ exertion. denies cp,lee, or claudication  . Mitral Regurgitation    History of Present Illness: [name] Graham is a 82 y.o. female who presents for follow-up evaluation.   She has a history of coronary artery disease status post CABG in 2009. She underwent a heart catheterization in 2014 to evaluate hemodynamics and coronary anatomy in the setting of anginal symptoms and shortness of breath. Her right heart pressures and hemodynamics were normal. The LAD showed stable diffuse moderate proximal vessel stenosis unchanged from 2009. Her graft anatomy was stable with patency of the vein graft to diagonal and atresia of the LIMA graft.  The patient's husband passed away last year. They were married for over 62 years. We spent a lot of time talking about him today. The patient is doing reasonably well considering her circumstances. She has not had any progression of her anginal symptoms and in fact her chest pain is improved from the time I saw her last year. She continues to experience shortness of breath with activity but there has been no progression. She denies orthopnea or PND. She denies leg swelling.  Past Medical History  Diagnosis Date  . Exertional angina (HCC)   . Coronary artery disease     single vessel of the LAD  . Normal echocardiogram     normal lv function  . Bundle branch block   . Lung nodule     lower  . Anemia     mild normocytic anemia  . Thrombocytopenia (HCC)     platelet count 80,000  . Anxiety   . GERD (gastroesophageal reflux disease)     Past Surgical History  Procedure Laterality Date  . Coronary artery bypass graft  11/04/2008      Ivin Poot, M.D.  . Cardiac catheterization N/A 06/12/2015    Procedure: Left Heart  Cath and Cors/Grafts Angiography;  Surgeon: Sherren Mocha, MD;  Location: [facility];  Service: Cardiovascular;  Laterality: N/A;    Current Outpatient Prescriptions  Medication Sig Dispense Refill  . nitroGLYCERIN (NITROSTAT) 0.4 MG SL tablet Place 0.4 mg under the tongue every 5 (five) minutes as needed for chest pain.    Marland Kitchen aspirin 81 MG tablet Take 81 mg by mouth daily.     . Calcium Carbonate-Vit D-Min 600-400 MG-UNIT TABS Take 1 tablet by mouth daily.      . clonazePAM (KLONOPIN) 0.5 MG tablet Take 1 tablet by mouth at bedtime.    . hydrochlorothiazide (MICROZIDE) 12.5 MG capsule Take 1 capsule (12.5 mg total) by mouth daily. 90 capsule 3  . isosorbide mononitrate (IMDUR) 120 MG 24 hr tablet Take 1 tablet (120 mg total) by mouth daily. 90 tablet 3  . metoprolol (LOPRESSOR) 50 MG tablet Take 1.5 tablets (75 mg total) by mouth 2 (two) times daily. 270 tablet 3  . Multiple Vitamins-Minerals (MULTIVITAMIN WITH MINERALS) tablet Take 1 tablet by mouth daily.      Marland Kitchen omeprazole (PRILOSEC) 40 MG capsule Take 1 capsule (40 mg total) by mouth daily. 90 capsule 3  . potassium chloride (K-DUR) 10 MEQ tablet Take 1 tablet (10 mEq total) by mouth daily. 90 tablet 3  . simvastatin (ZOCOR) 40 MG tablet Take 1 tablet (  40 mg total) by mouth at bedtime. 90 tablet 3   No current facility-administered medications for this visit.    Allergies:   Review of patient's allergies indicates no known allergies.   Social History:  The patient  reports that she has never smoked. She does not have any smokeless tobacco history on file. She reports that she does not drink alcohol or use illicit drugs.   Family History:  The patient's family history includes CAD in her sister; Heart failure in her father and mother.    ROS:  Please see the history of present illness.  Otherwise, review of systems is positive for exertional dyspnea.  All other systems are reviewed and negative.    PHYSICAL EXAM: VS:  BP  120/66 mmHg  Pulse 54  Ht 5\' 4"  (1.626 m)  Wt 123 lb 6.4 oz (55.974 kg)  BMI 21.17 kg/m2 , BMI Body mass index is 21.17 kg/(m^2). GEN: Well nourished, well developed, Pleasant elderly woman in no acute distress HEENT: normal Neck: no JVD, no masses. No carotid bruits Cardiac: RRR without murmur or gallop                Respiratory:  clear to auscultation bilaterally, normal work of breathing GI: soft, nontender, nondistended, + BS MS: no deformity or atrophy Ext: no pretibial edema, pedal pulses 2+= bilaterally Skin: warm and dry, no rash Neuro:  Strength and sensation are intact Psych: euthymic mood, full affect  EKG:  EKG is ordered today. The ekg ordered today shows sinus bradycardia 54 bpm, LBBB, no change from previous tracings  Recent Labs: 06/12/2015: BUN 18; Creatinine, Ser 0.96; Hemoglobin 13.5; Platelets 117*; Potassium 4.2; Sodium 139   Lipid Panel     Component Value Date/Time   CHOL 174 12/19/2014 0941   TRIG 269.0* 12/19/2014 0941   HDL 30.10* 12/19/2014 0941   CHOLHDL 6 12/19/2014 0941   VLDL 53.8* 12/19/2014 0941   LDLCALC * 10/30/2008 0610    118        Total Cholesterol/HDL:CHD Risk Coronary Heart Disease Risk Table                     Men   Women  1/2 Average Risk   3.4   3.3   LDLDIRECT 109.0 12/19/2014 0941    Wt Readings from Last 3 Encounters:  05/11/16 123 lb 6.4 oz (55.974 kg)  06/12/15 115 lb (52.164 kg)  05/29/15 126 lb (57.153 kg)     Cardiac Studies Reviewed: 06-12-2015 Cardiac Cath: Conclusion    1. Prox RCA lesion, 30% stenosed. 2. Prox LAD lesion, 50% stenosed. 3. was injected is small. 4. There is mild disease in the graft. 5. There is mild diffuse vein graft irregularity unchanged from the previous study 6. The left ventricular systolic function is normal.  FINAL CONCLUSIONS:  MILD-MODERATE DIFFUSE PROXIMAL AND MID-LAD STENOSIS  WIDELY PATENT LCX  PATENT RCA, INITIALLY WITH PROXIMAL VASOSPASM RESOLVED WITH IC  NTG  VIGOROUS/NORMAL LV FUNCTION  STABLE/IMPROVED FINDINGS FROM PRIOR CATH STUDIES  RECOMMEND CONTINUED MEDICAL MANAGEMENT    ASSESSMENT AND PLAN: 1.  Coronary artery disease, native vessel, with CCS class II angina: The patient is on a maximally tolerated medical program. She does experience chest pressure when she takes her medications late. However, her chest pain is well controlled on medicines. Her cardiac catheterization from last year is reviewed. Current medicines will be continued without changes.  2. Essential hypertension: Blood pressure is well controlled on current therapy.  3. Hyperlipidemia: Treated with simvastatin. Most recent lipids are reviewed.   Current medicines are reviewed with the patient today.  The patient does not have concerns regarding medicines.  Labs/ tests ordered today include:  No orders of the defined types were placed in this encounter.    Disposition:   FU 6 months  Signed, Sherren Mocha, MD  05/11/2016 9:14 AM    [facility] [place], [place], [place]  [address] Phone: [phone]; Fax: [phone]

## 2016-05-11 NOTE — Patient Instructions (Signed)

## 2016-06-07 ENCOUNTER — Ambulatory Visit: Payer: Medicare Other | Admitting: Cardiovascular Disease

## 2016-06-07 ENCOUNTER — Telehealth: Payer: Self-pay | Admitting: Cardiovascular Disease

## 2016-06-07 DIAGNOSIS — I25119 Atherosclerotic heart disease of native coronary artery with unspecified angina pectoris: Secondary | ICD-10-CM

## 2016-06-07 DIAGNOSIS — R079 Chest pain, unspecified: Secondary | ICD-10-CM

## 2016-06-07 NOTE — Telephone Encounter (Signed)
Records received from 06/01/16 office visit with Dr Woody Seller where pt complained of episode of chest pain. Dr Woody Seller advised pt to follow-up with Cardiology for stress testing.    I attempted to reach the pt by phone but no answer at this time.

## 2016-06-07 NOTE — Telephone Encounter (Signed)
Pt calling regarding had a "spell" since last visit-wants to talk to [place] to discuss

## 2016-06-10 NOTE — Telephone Encounter (Signed)
Reviewed office note. Last heart catheterization was one year ago. I do not think this patient is able to exercise on the treadmill. It would be reasonable to perform a pharmacologic nuclear scan. Please order Lexiscan Myoview stress test for diagnosis: Coronary artery disease, native vessel, with angina

## 2016-06-11 NOTE — Telephone Encounter (Signed)
I spoke with the pt and made her aware that Dr Burt Knack also recommended a lexiscan myoview to further evaluate her chest pain.  Order placed and pre-test instructions reviewed with the pt by phone. I will have a scheduler contact her in regards to appointment.

## 2016-06-14 ENCOUNTER — Telehealth (HOSPITAL_COMMUNITY): Payer: Self-pay | Admitting: *Deleted

## 2016-06-14 NOTE — Telephone Encounter (Signed)
Attempted to call patient regarding upcoming appointment- no answer.  [name] Graham  

## 2016-06-16 ENCOUNTER — Ambulatory Visit (HOSPITAL_COMMUNITY): Payer: Medicare Other

## 2016-06-30 ENCOUNTER — Encounter (HOSPITAL_COMMUNITY): Payer: Medicare Other

## 2016-07-01 ENCOUNTER — Other Ambulatory Visit: Payer: Self-pay | Admitting: Cardiovascular Disease

## 2016-07-01 DIAGNOSIS — I10 Essential (primary) hypertension: Secondary | ICD-10-CM

## 2016-07-01 DIAGNOSIS — E785 Hyperlipidemia, unspecified: Secondary | ICD-10-CM

## 2016-07-01 DIAGNOSIS — I25118 Atherosclerotic heart disease of native coronary artery with other forms of angina pectoris: Secondary | ICD-10-CM

## 2016-07-01 DIAGNOSIS — I25119 Atherosclerotic heart disease of native coronary artery with unspecified angina pectoris: Secondary | ICD-10-CM

## 2016-08-06 ENCOUNTER — Encounter (INDEPENDENT_AMBULATORY_CARE_PROVIDER_SITE_OTHER): Payer: Self-pay | Admitting: Internal Medicine

## 2016-08-10 ENCOUNTER — Encounter (INDEPENDENT_AMBULATORY_CARE_PROVIDER_SITE_OTHER): Payer: Self-pay | Admitting: Internal Medicine

## 2016-08-10 ENCOUNTER — Other Ambulatory Visit (INDEPENDENT_AMBULATORY_CARE_PROVIDER_SITE_OTHER): Payer: Self-pay | Admitting: Internal Medicine

## 2016-08-10 ENCOUNTER — Ambulatory Visit (INDEPENDENT_AMBULATORY_CARE_PROVIDER_SITE_OTHER): Payer: Medicare Other | Admitting: Internal Medicine

## 2016-08-10 ENCOUNTER — Encounter (INDEPENDENT_AMBULATORY_CARE_PROVIDER_SITE_OTHER): Payer: Self-pay | Admitting: *Deleted

## 2016-08-10 ENCOUNTER — Encounter (INDEPENDENT_AMBULATORY_CARE_PROVIDER_SITE_OTHER): Payer: Self-pay

## 2016-08-10 ENCOUNTER — Telehealth (INDEPENDENT_AMBULATORY_CARE_PROVIDER_SITE_OTHER): Payer: Self-pay | Admitting: *Deleted

## 2016-08-10 VITALS — BP 148/78 | HR 76 | Temp 98.6°F | Ht 64.0 in | Wt 124.5 lb

## 2016-08-10 DIAGNOSIS — Z800 Family history of malignant neoplasm of digestive organs: Secondary | ICD-10-CM

## 2016-08-10 DIAGNOSIS — R195 Other fecal abnormalities: Secondary | ICD-10-CM | POA: Diagnosis not present

## 2016-08-10 NOTE — Telephone Encounter (Signed)
Patient needs trilyte 

## 2016-08-10 NOTE — Patient Instructions (Signed)
Colonoscopy.  The risks and benefits such as perforation, bleeding, and infection were reviewed with the patient and is agreeable. 

## 2016-08-10 NOTE — Progress Notes (Signed)
Subjective:    Patient ID: [name] Graham, female    DOB: August 24, 1934, 82 y.o.   MRN: [id number]  HPI Referred by Dr. Woody Seller for positive stool card. Patient denies seeing any blood in her stool. There has been no change in her stools.  Her appetite is good.  No weight loss. No dysphagia. She occasionally has rt mid abdominal pain. Her last colonoscopy was in 2009 by Dr. Lindalou Hose with a sessile polyp which was removed. Normal appearing cecum, ileocecal valve. Ascending, transverse, descending and sigmoid, rectum were normal. Retroflexion was not done.   She says has acid reflux which comes in spells.  She says it usually occurs at night. She does not have the [place] elevated.  Family hx of colon cancer in a sister age 26. who is present today.  07/10/2016  H and H 12.6 and 37.3, MCV 92. Platelet ct 142, TSH 3.13   06/12/2015 Cardiac Cath: 1. Prox RCA lesion, 30% stenosed. 2. Prox LAD lesion, 50% stenosed. 3. was injected is small. 4. There is mild disease in the graft. 5. There is mild diffuse vein graft irregularity unchanged from the previous study 6. The left ventricular systolic function is normal.   FINAL CONCLUSIONS:  MILD-MODERATE DIFFUSE PROXIMAL AND MID-LAD STENOSIS  WIDELY PATENT LCX  PATENT RCA, INITIALLY WITH PROXIMAL VASOSPASM RESOLVED WITH IC NTG  VIGOROUS/NORMAL LV FUNCTION  STABLE/IMPROVED FINDINGS FROM PRIOR CATH STUDIES  RECOMMEND CONTINUED MEDICAL MANAGEMENT      Review of Systems Past Medical History:  Diagnosis Date  . Anemia    mild normocytic anemia  . Anxiety   . Bundle branch block   . Coronary artery disease    single vessel of the LAD  . Exertional angina (HCC)   . GERD (gastroesophageal reflux disease)   . Lung nodule    lower  . Normal echocardiogram    normal lv function  . Thrombocytopenia (HCC)    platelet count 80,000    Past Surgical History:  Procedure Laterality Date  . CARDIAC CATHETERIZATION N/A 06/12/2015   Procedure: Left  Heart Cath and Cors/Grafts Angiography;  Surgeon: Sherren Mocha, MD;  Location: [facility];  Service: Cardiovascular;  Laterality: N/A;  . CORONARY ARTERY BYPASS GRAFT  11/04/2008     Ivin Poot, M.D.    No Known Allergies  Current Outpatient Prescriptions on File Prior to Visit  Medication Sig Dispense Refill  . aspirin 81 MG tablet Take 81 mg by mouth daily.     . Calcium Carbonate-Vit D-Min 600-400 MG-UNIT TABS Take 1 tablet by mouth daily.      . hydrochlorothiazide (MICROZIDE) 12.5 MG capsule Take 1 capsule (12.5 mg total) by mouth daily. (Patient taking differently: Take 12.5 mg by mouth as needed. ) 90 capsule 3  . isosorbide mononitrate (IMDUR) 120 MG 24 hr tablet Take 1 tablet (120 mg total) by mouth daily. 90 tablet 3  . metoprolol (LOPRESSOR) 50 MG tablet TAKE ONE & ONE-HALF TABLETS BY MOUTH TWICE DAILY (Patient taking differently: Takes one in am and sometimes in the evening) 270 tablet 1  . Multiple Vitamins-Minerals (MULTIVITAMIN WITH MINERALS) tablet Take 1 tablet by mouth daily.      Marland Kitchen omeprazole (PRILOSEC) 40 MG capsule Take 1 capsule (40 mg total) by mouth daily. 90 capsule 3  . potassium chloride (K-DUR) 10 MEQ tablet Take 1 tablet (10 mEq total) by mouth daily. (Patient taking differently: Take 10 mEq by mouth as needed. ) 90 tablet 3  .  simvastatin (ZOCOR) 40 MG tablet Take 1 tablet (40 mg total) by mouth at bedtime. 90 tablet 3   No current facility-administered medications on file prior to visit.        Objective:   Physical Exam Blood pressure (!) 148/78, pulse 76, temperature 98.6 F (37 C), height 5\' 4"  (1.626 m), weight 124 lb 8 oz (56.5 kg).  Alert and oriented. Skin warm and dry. Oral mucosa is moist.   . Sclera anicteric, conjunctivae is pink. Thyroid not enlarged. No cervical lymphadenopathy. Lungs clear. Heart regular rate and rhythm.  Abdomen is soft. Bowel sounds are positive. No hepatomegaly. No abdominal masses felt. No tenderness.  No  edema to lower extremities. Rectal exam: no stool and guaiac negative.      Assessment & Plan:  Guaiac positive stool. Family hx of colon cancer in a sister. Colonic neoplasm, AVM, ulcer, polyp needs to be ruled out.

## 2016-08-11 ENCOUNTER — Telehealth (INDEPENDENT_AMBULATORY_CARE_PROVIDER_SITE_OTHER): Payer: Self-pay | Admitting: *Deleted

## 2016-08-11 ENCOUNTER — Encounter (INDEPENDENT_AMBULATORY_CARE_PROVIDER_SITE_OTHER): Payer: Self-pay | Admitting: *Deleted

## 2016-08-11 DIAGNOSIS — R195 Other fecal abnormalities: Secondary | ICD-10-CM | POA: Insufficient documentation

## 2016-08-11 DIAGNOSIS — Z800 Family history of malignant neoplasm of digestive organs: Secondary | ICD-10-CM | POA: Insufficient documentation

## 2016-08-11 MED ORDER — PEG 3350-KCL-NA BICARB-NACL 420 G PO SOLR: 4000.0000 mL | Freq: Once | ORAL | 0 refills | Status: AC

## 2016-08-11 NOTE — Telephone Encounter (Signed)
Patient wass seen in office by Deberah Castle on 08/10/16 and was scheduled for TCS. When patient was brought to me she very upset because she had seen Terri and not a doctor. She went on and on about how you should see a doctor not a nurse when you are having a problem. I tried to explain to patient that Dr Laural Golden was booked and for her to be seen soon we had to bring her in to see Terri and that Dr Laural Golden would do TCS. I gave her my next available of 10/07/16, she was not happy and keep asking for a sooner appointment. I explained that Dr Laural Golden was booked but if I had a cancellation I would call her. She proceeded to tell tell me no I wouldn't and she had no reason to believe I would. She also stated she was going to tell Dr Woody Seller how disappointed she was and she was going to tell Dr Woody Seller to tell Dr Laural Golden he eed better help in front office. After patient left I did get a cancellation for 09/01/16. I tried calling patient several times and she would either answer and hand up or not answer at all. I did call Pat with Dr Woody Seller and explained the situation to her and she was going to replay information to Dr Woody Seller. This morning I tried calling Ms Troise twice at her home# and each time she would pick and then hang up when I spoke. I call Fraser Din back and old her and she said Dr Woody Seller was aware and he had her call patient to see if she wanted the 9/27 appt and Fraser Din said patient no that she would keep the 10/07/16 appointment.

## 2016-10-07 ENCOUNTER — Encounter (HOSPITAL_COMMUNITY): Payer: Self-pay | Admitting: *Deleted

## 2016-10-07 ENCOUNTER — Ambulatory Visit (HOSPITAL_COMMUNITY)
Admission: RE | Admit: 2016-10-07 | Discharge: 2016-10-07 | Disposition: A | Discharge status: Home / Self Care | Payer: Medicare Other | Source: Ambulatory Visit | Attending: Internal Medicine | Admitting: Internal Medicine

## 2016-10-07 ENCOUNTER — Other Ambulatory Visit (INDEPENDENT_AMBULATORY_CARE_PROVIDER_SITE_OTHER): Payer: Self-pay | Admitting: Internal Medicine

## 2016-10-07 ENCOUNTER — Encounter (HOSPITAL_COMMUNITY)
Admission: RE | Disposition: A | Discharge status: Home / Self Care | Payer: Self-pay | Source: Ambulatory Visit | Attending: Internal Medicine

## 2016-10-07 DIAGNOSIS — Z7982 Long term (current) use of aspirin: Secondary | ICD-10-CM | POA: Diagnosis not present

## 2016-10-07 DIAGNOSIS — K644 Residual hemorrhoidal skin tags: Secondary | ICD-10-CM | POA: Diagnosis not present

## 2016-10-07 DIAGNOSIS — R634 Abnormal weight loss: Secondary | ICD-10-CM

## 2016-10-07 DIAGNOSIS — Z800 Family history of malignant neoplasm of digestive organs: Secondary | ICD-10-CM

## 2016-10-07 DIAGNOSIS — K219 Gastro-esophageal reflux disease without esophagitis: Secondary | ICD-10-CM | POA: Diagnosis not present

## 2016-10-07 DIAGNOSIS — I2510 Atherosclerotic heart disease of native coronary artery without angina pectoris: Secondary | ICD-10-CM | POA: Insufficient documentation

## 2016-10-07 DIAGNOSIS — R1011 Right upper quadrant pain: Secondary | ICD-10-CM

## 2016-10-07 DIAGNOSIS — R195 Other fecal abnormalities: Secondary | ICD-10-CM | POA: Insufficient documentation

## 2016-10-07 DIAGNOSIS — Z79899 Other long term (current) drug therapy: Secondary | ICD-10-CM | POA: Diagnosis not present

## 2016-10-07 HISTORY — PX: COLONOSCOPY: SHX5424

## 2016-10-07 SURGERY — COLONOSCOPY
Anesthesia: Moderate Sedation

## 2016-10-07 MED ORDER — SODIUM CHLORIDE 0.9 % IV SOLN
INTRAVENOUS | Status: DC
  Administered 2016-10-07: 1000 mL via INTRAVENOUS

## 2016-10-07 MED ORDER — MIDAZOLAM HCL 5 MG/5ML IJ SOLN
INTRAMUSCULAR | Status: DC | PRN
  Administered 2016-10-07 (×2): 1 mg via INTRAVENOUS
  Administered 2016-10-07: 2 mg via INTRAVENOUS
  Administered 2016-10-07: 1 mg via INTRAVENOUS

## 2016-10-07 MED ORDER — MIDAZOLAM HCL 5 MG/5ML IJ SOLN
INTRAMUSCULAR | Status: AC
  Filled 2016-10-07: qty 10

## 2016-10-07 MED ORDER — MEPERIDINE HCL 50 MG/ML IJ SOLN
INTRAMUSCULAR | Status: DC | PRN
  Administered 2016-10-07 (×2): 25 mg via INTRAVENOUS

## 2016-10-07 MED ORDER — MEPERIDINE HCL 50 MG/ML IJ SOLN
INTRAMUSCULAR | Status: AC
  Filled 2016-10-07: qty 1

## 2016-10-07 NOTE — Discharge Instructions (Signed)
Resume usual medications and diet. No driving for 24 hours. Upper abdominal ultrasound to be scheduled.    *******ULTRASOUND ON Wednesday October 13, 2016. AT 9:15AM.         NOTHING TO EAT OR DRINK AFTER MIDNIGHT.                          Colonoscopy, Care After These instructions give you information on caring for yourself after your procedure. Your doctor may also give you more specific instructions. Call your doctor if you have any problems or questions after your procedure. HOME CARE  Do not drive for 24 hours.  Do not sign important papers or use machinery for 24 hours.  You may shower.  You may go back to your usual activities, but go slower for the first 24 hours.  Take rest breaks often during the first 24 hours.  Walk around or use warm packs on your belly (abdomen) if you have belly cramping or gas.  Drink enough fluids to keep your pee (urine) clear or pale yellow.  Resume your normal diet. Avoid heavy or fried foods.  Avoid drinking alcohol for 24 hours or as told by your doctor.  Only take medicines as told by your doctor. If a tissue sample (biopsy) was taken during the procedure:   Do not take aspirin or blood thinners for 7 days, or as told by your doctor.  Do not drink alcohol for 7 days, or as told by your doctor.  Eat soft foods for the first 24 hours. GET HELP IF: You still have a small amount of blood in your poop (stool) 2-3 days after the procedure. GET HELP RIGHT AWAY IF:  You have more than a small amount of blood in your poop.  You see clumps of tissue (blood clots) in your poop.  Your belly is puffy (swollen).  You feel sick to your stomach (nauseous) or throw up (vomit).  You have a fever.  You have belly pain that gets worse and medicine does not help. MAKE SURE YOU:  Understand these instructions.  Will watch your condition.  Will get help right away if you are not doing well or get worse.   This information is not  intended to replace advice given to you by your health care provider. Make sure you discuss any questions you have with your health care provider.   Document Released: 12/25/2010 Document Revised: 11/27/2013 Document Reviewed: 07/30/2013 Elsevier Interactive Patient Education [employer].

## 2016-10-07 NOTE — H&P (Signed)
[name] Graham is an 82 y.o. female.   Chief Complaint: [name] Graham is here for colonoscopy. HPI: [name] Graham is 82 year old Caucasian female who was recently found to have heme-positive stool and is here for diagnostic colonoscopy. [name] Graham denies rectal bleeding or melena. Lately [name] Graham's not had good appetite and lost few pounds. [name] Graham also complains of intermittent right upper quadrant pain. When [name] Graham passes [name] Graham feels bloated. Pain is never severe intractable and may current cycles. Last colonoscopy was 8 years: Removal of small rectal polyp. Histology is not available. Family history significant for CRC and sister with surgery at age 21 and is doing fine.  Past Medical History:  Diagnosis Date  .       Marland Kitchen Anxiety   . Bundle branch block   . Coronary artery disease    single vessel of the LAD  . Exertional angina (HCC)   . GERD (gastroesophageal reflux disease)   . Lung nodule    lower  . Normal echocardiogram    normal lv function  .         Past Surgical History:  Procedure Laterality Date  . CARDIAC CATHETERIZATION N/A 06/12/2015   Procedure: Left Heart Cath and Cors/Grafts Angiography;  Surgeon: Sherren Mocha, MD;  Location: [facility];  Service: Cardiovascular;  Laterality: N/A;  . CORONARY ARTERY BYPASS GRAFT  11/04/2008     Ivin Poot, M.D.    Family History  Problem Relation Age of Onset  . Heart failure Mother   . Heart failure Father   . CAD Sister    Social History:  reports that [name] Graham has never smoked. [name] Graham has never used smokeless tobacco. [name] Graham reports that [name] Graham does not drink alcohol or use drugs.  Allergies: No Known Allergies  Medications Prior to Admission  Medication Sig Dispense Refill  . aspirin 81 MG tablet Take 81 mg by mouth daily.     . Calcium Carbonate-Vit D-Min 600-400 MG-UNIT TABS Take 1 tablet by mouth daily.      . hydrochlorothiazide (MICROZIDE) 12.5 MG capsule Take 1 capsule (12.5 mg total) by mouth daily. ([name] Graham taking differently: Take 12.5  mg by mouth as needed. ) 90 capsule 3  . isosorbide mononitrate (IMDUR) 120 MG 24 hr tablet Take 1 tablet (120 mg total) by mouth daily. 90 tablet 3  . metoprolol (LOPRESSOR) 50 MG tablet TAKE ONE & ONE-HALF TABLETS BY MOUTH TWICE DAILY ([name] Graham taking differently: Takes one in am and sometimes in the evening) 270 tablet 1  . Multiple Vitamins-Minerals (MULTIVITAMIN WITH MINERALS) tablet Take 1 tablet by mouth daily.      Marland Kitchen omeprazole (PRILOSEC) 40 MG capsule Take 1 capsule (40 mg total) by mouth daily. 90 capsule 3  . potassium chloride (K-DUR) 10 MEQ tablet Take 1 tablet (10 mEq total) by mouth daily. ([name] Graham taking differently: Take 10 mEq by mouth as needed. ) 90 tablet 3  . simvastatin (ZOCOR) 40 MG tablet Take 1 tablet (40 mg total) by mouth at bedtime. 90 tablet 3    No results found for this or any previous visit (from the past 48 hour(s)). No results found.  ROS  Blood pressure 139/72, pulse 84, temperature 97.7 F (36.5 C), temperature source Oral, resp. rate 13, height 5\' 4"  (1.626 m), weight 115 lb (52.2 kg), SpO2 100 %. Physical Exam  Constitutional: [name] Graham appears well-developed and well-nourished.  HENT:  Mouth/Throat: Oropharynx is clear and moist.  Eyes: Conjunctivae are normal. No scleral icterus.  Neck: No thyromegaly present.  Cardiovascular: Normal rate, regular rhythm and normal heart sounds.   No murmur heard. Midsternal scar.  GI:  Abdomen is soft and nontender without organomegaly or masses.  Musculoskeletal: [name] Graham exhibits no edema.  Lymphadenopathy:    [name] Graham has no cervical adenopathy.  Neurological: [name] Graham is alert.  Skin: Skin is warm and dry.     Assessment/Plan Heme positive stool. Family history of CRC in a sibling younger than 36. Diagnostic colonoscopy.  Hildred Laser, MD 10/07/2016, 11:47 AM

## 2016-10-07 NOTE — Op Note (Signed)
[facility] Patient Name: [name] Graham Procedure Date: 10/07/2016 11:46 AM MRN: [id number] Date of Birth: 02/04/1934 Attending MD: Hildred Laser , MD CSN: [id number] Age: 82 Admit Type: Outpatient Procedure:                Colonoscopy Indications:              Heme positive stool, Family history of colon cancer                            in a first-degree relative Providers:                Hildred Laser, MD, Rosina Lowenstein, RN, Randa Spike, Technician Referring MD:             Glenda Chroman, MD Medicines:                Meperidine 50 mg IV, Midazolam 5 mg IV Complications:            No immediate complications. Estimated Blood Loss:     Estimated blood loss: none. Procedure:                Pre-Anesthesia Assessment:                           - Prior to the procedure, a History and Physical                            was performed, and patient medications and                            allergies were reviewed. The patient's tolerance of                            previous anesthesia was also reviewed. The risks                            and benefits of the procedure and the sedation                            options and risks were discussed with the patient.                            All questions were answered, and informed consent                            was obtained. Prior Anticoagulants: The patient                            last took aspirin 3 days prior to the procedure.                            ASA Grade Assessment: II - A patient with mild  systemic disease. After reviewing the risks and                            benefits, the patient was deemed in satisfactory                            condition to undergo the procedure.                           After obtaining informed consent, the colonoscope                            was passed under direct vision. Throughout the                             procedure, the patient's blood pressure, pulse, and                            oxygen saturations were monitored continuously. The                            [id number]) scope was introduced through                            the anus and advanced to the the cecum, identified                            by appendiceal orifice and ileocecal valve. The                            colonoscopy was performed without difficulty. The                            patient tolerated the procedure well. The quality                            of the bowel preparation was good. The ileocecal                            valve, appendiceal orifice, and rectum were                            photographed. Scope In: 11:57:51 AM Scope Out: 12:16:25 PM Scope Withdrawal Time: 0 hours 9 minutes 49 seconds  Total Procedure Duration: 0 hours 18 minutes 34 seconds  Findings:      The perianal and digital rectal examinations were normal.      The colon (entire examined portion) appeared normal.      External hemorrhoids were found during retroflexion. The hemorrhoids       were small. Impression:               - The entire examined colon is normal.                           - External hemorrhoids.                           -  No specimens collected. Moderate Sedation:      Moderate (conscious) sedation was administered by the endoscopy nurse       and supervised by the endoscopist. The following parameters were       monitored: oxygen saturation, heart rate, blood pressure, CO2       capnography and response to care. Total physician intraservice time was       23 minutes. Recommendation:           - Patient has a contact number available for                            emergencies. The signs and symptoms of potential                            delayed complications were discussed with the                            patient. Return to normal activities tomorrow.                            Written discharge  instructions were provided to the                            patient.                           - Resume previous diet today.                           - Continue present medications.                           - May consider high risk screening colonoscopy in 5                            years.                           - Perform an abdominal ultrasound at appointment to                            be scheduled to evaluate right upper quadrant                            abdominal pain and bloating and nausea. Procedure Code(s):        --- Professional ---                           [phone], Colonoscopy, flexible; diagnostic, including                            collection of specimen(s) by brushing or washing,                            when performed (separate procedure)  [id number], Moderate sedation services provided by the                            same physician or other qualified health care                            professional performing the diagnostic or                            therapeutic service that the sedation supports,                            requiring the presence of an independent trained                            observer to assist in the monitoring of the                            patient's level of consciousness and physiological                            status; initial 15 minutes of intraservice time,                            patient age 44 years or older                           [id number], Moderate sedation services; each additional                            15 minutes intraservice time Diagnosis Code(s):        --- Professional ---                           K64.4, Residual hemorrhoidal skin tags                           R19.5, Other fecal abnormalities                           Z80.0, Family history of malignant neoplasm of                            digestive organs CPT copyright 2016 [employer]. All rights  reserved. The codes documented in this report are preliminary and upon coder review may  be revised to meet current compliance requirements. Hildred Laser, MD Hildred Laser, MD 10/07/2016 12:26:56 PM This report has been signed electronically. Number of Addenda: 0

## 2016-10-13 ENCOUNTER — Encounter (HOSPITAL_COMMUNITY): Payer: Self-pay | Admitting: Internal Medicine

## 2016-10-13 ENCOUNTER — Ambulatory Visit (HOSPITAL_COMMUNITY)
Admission: RE | Admit: 2016-10-13 | Discharge: 2016-10-13 | Disposition: A | Discharge status: Home / Self Care | Payer: Medicare Other | Source: Ambulatory Visit | Attending: Internal Medicine | Admitting: Internal Medicine

## 2016-10-13 DIAGNOSIS — K7689 Other specified diseases of liver: Secondary | ICD-10-CM | POA: Diagnosis not present

## 2016-10-13 DIAGNOSIS — R634 Abnormal weight loss: Secondary | ICD-10-CM

## 2016-10-13 DIAGNOSIS — R1011 Right upper quadrant pain: Secondary | ICD-10-CM | POA: Diagnosis present

## 2016-10-26 ENCOUNTER — Other Ambulatory Visit (INDEPENDENT_AMBULATORY_CARE_PROVIDER_SITE_OTHER): Payer: Self-pay | Admitting: *Deleted

## 2016-10-26 ENCOUNTER — Encounter (INDEPENDENT_AMBULATORY_CARE_PROVIDER_SITE_OTHER): Payer: Self-pay | Admitting: Internal Medicine

## 2016-10-26 ENCOUNTER — Encounter (INDEPENDENT_AMBULATORY_CARE_PROVIDER_SITE_OTHER): Payer: Self-pay

## 2016-10-26 ENCOUNTER — Ambulatory Visit (INDEPENDENT_AMBULATORY_CARE_PROVIDER_SITE_OTHER): Payer: Medicare Other | Admitting: Internal Medicine

## 2016-10-26 VITALS — BP 128/82 | Temp 97.8°F | Ht 64.0 in | Wt 122.0 lb

## 2016-10-26 DIAGNOSIS — D696 Thrombocytopenia, unspecified: Secondary | ICD-10-CM

## 2016-10-26 DIAGNOSIS — K219 Gastro-esophageal reflux disease without esophagitis: Secondary | ICD-10-CM

## 2016-10-26 DIAGNOSIS — K7689 Other specified diseases of liver: Secondary | ICD-10-CM

## 2016-10-26 MED ORDER — RANITIDINE HCL 150 MG PO TABS: 150.0000 mg | ORAL_TABLET | Freq: Every evening | ORAL | Status: DC | PRN

## 2016-10-26 MED ORDER — PANTOPRAZOLE SODIUM 40 MG PO TBEC: 40.0000 mg | DELAYED_RELEASE_TABLET | Freq: Every day | ORAL | 5 refills | Status: DC

## 2016-10-26 NOTE — Progress Notes (Signed)
Presenting complaint;  Here to discuss results of recent ultrasound. Patient complains of frequent heartburn in spite of taking medication.  Database and Subjective:  Patient is 82 year old Caucasian female who was seen in September 2017 because of heme positive stool and family history of CRC in sister. Her H&H was normal. Colonoscopy was normal other than external hemorrhoids. At the time of colonoscopy she was complaining of right upper quadrant pain bloating and nausea. She therefore underwent abdominal ultrasound which was negative for cholelithiasis but revealed hepatic cysts. She has been very concerned about this finding. He hasn't had any more right upper quadrant abdominal pain but she complains of daily heartburn and nocturnal regurgitation. She says Prilosec is not working anymore. She has not experienced nausea vomiting dysphagia melena or rectal bleeding.    Current Medications: Outpatient Encounter Prescriptions as of 10/26/2016  Medication Sig  . aspirin 81 MG tablet Take 81 mg by mouth daily.   . Calcium Carbonate-Vit D-Min 600-400 MG-UNIT TABS Take 1 tablet by mouth daily.    . cycloSPORINE (RESTASIS) 0.05 % ophthalmic emulsion Place 1 drop into both eyes 2 times daily.  . hydrochlorothiazide (MICROZIDE) 12.5 MG capsule Take 1 capsule (12.5 mg total) by mouth daily. (Patient taking differently: Take 12.5 mg by mouth as needed. )  . isosorbide mononitrate (IMDUR) 120 MG 24 hr tablet Take 1 tablet (120 mg total) by mouth daily.  . metoprolol (LOPRESSOR) 50 MG tablet TAKE ONE & ONE-HALF TABLETS BY MOUTH TWICE DAILY (Patient taking differently: Takes one in am and sometimes in the evening)  . Multiple Vitamins-Minerals (MULTIVITAMIN WITH MINERALS) tablet Take 1 tablet by mouth daily.    Marland Kitchen omeprazole (PRILOSEC) 40 MG capsule Take 1 capsule (40 mg total) by mouth daily.  . potassium chloride (K-DUR) 10 MEQ tablet Take 1 tablet (10 mEq total) by mouth daily. (Patient taking  differently: Take 10 mEq by mouth as needed. )  . simvastatin (ZOCOR) 40 MG tablet Take 1 tablet (40 mg total) by mouth at bedtime.   No facility-administered encounter medications on file as of 10/26/2016.      Objective: Blood pressure 128/82, temperature 97.8 F (36.6 C), height 5\' 4"  (1.626 m), weight 122 lb (55.3 kg). Patient is alert and in no acute distress. Conjunctiva is pink. Sclera is nonicteric Oropharyngeal mucosa is normal. No neck masses or thyromegaly noted. Cardiac exam with regular rhythm normal S1 and S2. No murmur or gallop noted. Lungs are clear to auscultation. Abdomen is symmetrical and soft without tenderness or splenomegaly. Liver edge is palpable below RCM. It is nontender. No LE edema or clubbing noted.  Labs/studies Results: Lab data from 07/30/2016  WBC 6.4, H&H 12.6 and 37.3 and platelet count 142K Serum creatinine 0.82 and calcium 9.2 Bili 0.6, AP 55, AST 16, ALT 13, total protein 6.3 and albumin 4.5.  Ultrasound on 10/13/2016 No evidence of gallstones or wall thickening Echogenic liver with multiple hepatic cysts. Spleen upper size of normal.    Assessment:  #1. Hepatic cysts. This is an incidental finding. She has had hepatic cysts for few years. I do not believe that this is a congenital problem. It appears to be acquired and of no clinical significance. Patient reassured. #2. GERD. Symptoms not well controlled with dietary measures and omeprazole. Will change her PPI and if she does not feel better will need further evaluation. #3. Chronic thrombocytopenia. This dates back to at least 8 years. Platelet count was near normal but 3 months ago. She has no  stigmata of chronic liver disease.   Plan:  Discontinue omeprazole. Pantoprazole 40 mg by mouth every morning. Patient will call with progress report in 2-3 weeks. Office visit in 6 months.

## 2016-10-26 NOTE — Patient Instructions (Signed)
Please call office with progress report in 2-3 weeks. 

## 2016-10-27 ENCOUNTER — Encounter: Payer: Self-pay | Admitting: *Deleted

## 2016-11-08 ENCOUNTER — Ambulatory Visit: Payer: Medicare Other | Admitting: Cardiovascular Disease

## 2016-11-08 NOTE — Progress Notes (Deleted)
Cardiology Office Note Date:  11/08/2016   ID:  [name] Graham, DOB 1934-06-11, MRN [id number]  PCP:  Glenda Chroman, MD  Cardiologist:  Sherren Mocha, MD    No chief complaint on file.    History of Present Illness: [name] Graham is a 82 y.o. female who presents for follow-up evaluation. The patient was last seen in June 2017. She is followed for coronary artery disease and underwent multivessel CABG in 2009. Her most recent heart catheterization in 2014 demonstrated normal right heart pressures and hemodynamics. There was stable moderate proximal LAD stenosis, continued patency of the vein graft to diagonal, and the LIMA graft was atretic (chronic finding).    Past Medical History:  Diagnosis Date  . Anemia    mild normocytic anemia  . Anxiety   . Bundle branch block   . Coronary artery disease    single vessel of the LAD  . Exertional angina (HCC)   . GERD (gastroesophageal reflux disease)   . Lung nodule    lower  . Normal echocardiogram    normal lv function  . Thrombocytopenia (HCC)    platelet count 80,000    Past Surgical History:  Procedure Laterality Date  . CARDIAC CATHETERIZATION N/A 06/12/2015   Procedure: Left Heart Cath and Cors/Grafts Angiography;  Surgeon: Sherren Mocha, MD;  Location: [facility];  Service: Cardiovascular;  Laterality: N/A;  . COLONOSCOPY N/A 10/07/2016   Procedure: COLONOSCOPY;  Surgeon: Rogene Houston, MD;  Location: [facility];  Service: Endoscopy;  Laterality: N/A;  2:45  . CORONARY ARTERY BYPASS GRAFT  11/04/2008     [name] Graham, M.D.    Current Outpatient Prescriptions  Medication Sig Dispense Refill  . aspirin 81 MG tablet Take 81 mg by mouth daily.     . Calcium Carbonate-Vit D-Min 600-400 MG-UNIT TABS Take 1 tablet by mouth daily.      . cycloSPORINE (RESTASIS) 0.05 % ophthalmic emulsion Place 1 drop into both eyes 2 times daily.    . hydrochlorothiazide (MICROZIDE) 12.5 MG capsule Take 1 capsule (12.5  mg total) by mouth daily. (Patient taking differently: Take 12.5 mg by mouth as needed. ) 90 capsule 3  . isosorbide mononitrate (IMDUR) 120 MG 24 hr tablet Take 1 tablet (120 mg total) by mouth daily. 90 tablet 3  . metoprolol (LOPRESSOR) 50 MG tablet TAKE ONE & ONE-HALF TABLETS BY MOUTH TWICE DAILY (Patient taking differently: Takes one in am and sometimes in the evening) 270 tablet 1  . Multiple Vitamins-Minerals (MULTIVITAMIN WITH MINERALS) tablet Take 1 tablet by mouth daily.      . pantoprazole (PROTONIX) 40 MG tablet Take 1 tablet (40 mg total) by mouth daily before breakfast. 30 tablet 5  . potassium chloride (K-DUR) 10 MEQ tablet Take 1 tablet (10 mEq total) by mouth daily. (Patient taking differently: Take 10 mEq by mouth as needed. ) 90 tablet 3  . ranitidine (ZANTAC) 150 MG tablet Take 1 tablet (150 mg total) by mouth at bedtime as needed for heartburn.    . simvastatin (ZOCOR) 40 MG tablet Take 1 tablet (40 mg total) by mouth at bedtime. 90 tablet 3   No current facility-administered medications for this visit.     Allergies:   Patient has no known allergies.   Social History:  The patient  reports that she has never smoked. She has never used smokeless tobacco. She reports that she does not drink alcohol or use drugs.   Family  History:  The patient's *** family history includes CAD in her sister; Heart failure in her father and mother.    ROS:  Please see the history of present illness.  Otherwise, review of systems is positive for ***.  All other systems are reviewed and negative.    PHYSICAL EXAM: VS:  There were no vitals taken for this visit. , BMI There is no height or weight on file to calculate BMI. GEN: Well nourished, well developed, in no acute distress  HEENT: normal  Neck: no JVD, no masses. No carotid bruits*** Cardiac: ***RRR without murmur or gallop                Respiratory:  clear to auscultation bilaterally, normal work of breathing GI: soft, nontender,  nondistended, + BS MS: no deformity or atrophy  Ext: no pretibial edema, ***pedal pulses 2+= bilaterally Skin: warm and dry, no rash Neuro:  Strength and sensation are intact Psych: euthymic mood, full affect  EKG:  EKG {ACTION; IS/IS [id number] ordered today. The ekg ordered today shows ***  Recent Labs: No results found for requested labs within last 8760 hours.   Lipid Panel     Component Value Date/Time   CHOL 174 12/19/2014 0941   TRIG 269.0 (H) 12/19/2014 0941   HDL 30.10 (L) 12/19/2014 0941   CHOLHDL 6 12/19/2014 0941   VLDL 53.8 (H) 12/19/2014 0941   LDLCALC (H) 10/30/2008 0610    118        Total Cholesterol/HDL:CHD Risk Coronary Heart Disease Risk Table                     Men   Women  1/2 Average Risk   3.4   3.3   LDLDIRECT 109.0 12/19/2014 0941      Wt Readings from Last 3 Encounters:  10/26/16 122 lb (55.3 kg)  10/07/16 115 lb (52.2 kg)  08/10/16 124 lb 8 oz (56.5 kg)     Cardiac Studies Reviewed: 06-12-2015 Cardiac Cath:    Conclusion    1. Prox RCA lesion, 30% stenosed. 2. Prox LAD lesion, 50% stenosed. 3. was injected is small. 4. There is mild disease in the graft. 5. There is mild diffuse vein graft irregularity unchanged from the previous study 6. The left ventricular systolic function is normal.  FINAL CONCLUSIONS:  MILD-MODERATE DIFFUSE PROXIMAL AND MID-LAD STENOSIS  WIDELY PATENT LCX  PATENT RCA, INITIALLY WITH PROXIMAL VASOSPASM RESOLVED WITH IC NTG  VIGOROUS/NORMAL LV FUNCTION  STABLE/IMPROVED FINDINGS FROM PRIOR CATH STUDIES  RECOMMEND CONTINUED MEDICAL MANAGEMENT   ASSESSMENT AND PLAN: 1.  CAD, native vessel, with angina: CCS *** symptoms.   2. HTN:  3. Hyperlipidemia:  Current medicines are reviewed with the patient today.  The patient does not have concerns regarding medicines.  Labs/ tests ordered today include:  No orders of the defined types were placed in this encounter.   Disposition:   FU 6  months  Signed, Sherren Mocha, MD  11/08/2016 8:05 AM    [facility] [place], [place], [place]  [address] Phone: [phone]; Fax: [phone]

## 2016-11-19 ENCOUNTER — Encounter: Payer: Self-pay | Admitting: Cardiovascular Disease

## 2016-11-28 ENCOUNTER — Emergency Department (HOSPITAL_COMMUNITY): Payer: Medicare Other

## 2016-11-28 ENCOUNTER — Emergency Department (HOSPITAL_COMMUNITY)
Admission: EM | Admit: 2016-11-28 | Discharge: 2016-11-28 | Disposition: A | Discharge status: Home / Self Care | Payer: Medicare Other | Attending: Emergency Medicine | Admitting: Emergency Medicine

## 2016-11-28 ENCOUNTER — Encounter (HOSPITAL_COMMUNITY): Payer: Self-pay | Admitting: Emergency Medicine

## 2016-11-28 DIAGNOSIS — Z955 Presence of coronary angioplasty implant and graft: Secondary | ICD-10-CM | POA: Insufficient documentation

## 2016-11-28 DIAGNOSIS — Y9301 Activity, walking, marching and hiking: Secondary | ICD-10-CM | POA: Diagnosis not present

## 2016-11-28 DIAGNOSIS — Z79899 Other long term (current) drug therapy: Secondary | ICD-10-CM | POA: Diagnosis not present

## 2016-11-28 DIAGNOSIS — S00511A Abrasion of lip, initial encounter: Secondary | ICD-10-CM | POA: Diagnosis not present

## 2016-11-28 DIAGNOSIS — Y999 Unspecified external cause status: Secondary | ICD-10-CM | POA: Diagnosis not present

## 2016-11-28 DIAGNOSIS — W01198A Fall on same level from slipping, tripping and stumbling with subsequent striking against other object, initial encounter: Secondary | ICD-10-CM | POA: Diagnosis not present

## 2016-11-28 DIAGNOSIS — Y92481 Parking lot as the place of occurrence of the external cause: Secondary | ICD-10-CM | POA: Insufficient documentation

## 2016-11-28 DIAGNOSIS — T148XXA Other injury of unspecified body region, initial encounter: Secondary | ICD-10-CM

## 2016-11-28 DIAGNOSIS — S0990XA Unspecified injury of head, initial encounter: Secondary | ICD-10-CM | POA: Diagnosis not present

## 2016-11-28 DIAGNOSIS — I2510 Atherosclerotic heart disease of native coronary artery without angina pectoris: Secondary | ICD-10-CM | POA: Diagnosis not present

## 2016-11-28 DIAGNOSIS — Z7982 Long term (current) use of aspirin: Secondary | ICD-10-CM | POA: Insufficient documentation

## 2016-11-28 DIAGNOSIS — S0993XA Unspecified injury of face, initial encounter: Secondary | ICD-10-CM | POA: Diagnosis present

## 2016-11-28 DIAGNOSIS — W19XXXA Unspecified fall, initial encounter: Secondary | ICD-10-CM

## 2016-11-28 MED ORDER — ACETAMINOPHEN 500 MG PO TABS
1000.0000 mg | ORAL_TABLET | Freq: Once | ORAL | Status: AC
  Administered 2016-11-28: 1000 mg via ORAL
  Filled 2016-11-28: qty 2

## 2016-11-28 NOTE — ED Provider Notes (Signed)
[facility] DEPT Provider Note   CSN: [id number] Arrival date & time: 11/28/16  1652     History   Chief Complaint Chief Complaint  Patient presents with  . Fall  . Lip Laceration    HPI [name] Graham is a 82 y.o. female.  HPI  82 year old female who presents after mechanical fall. She has been in her usual state of health. Today had a mechanical fall while walking over a speed bump in a parking lot. She fell on her face. She did not have loss of consciousness and was able to get up and ambulate after her fall. Sustained abrasions to her lip and bilateral hands. He complains of left-sided headache and left-sided facial pain. No neck pain, back pain, chest pain or abdominal pain. No extremity injury. No nausea, vomiting, vision or speech changes, focal numbness or weakness.  Past Medical History:  Diagnosis Date  . Anemia    mild normocytic anemia  . Anxiety   . Bundle branch block   . Coronary artery disease    single vessel of the LAD  . Exertional angina (HCC)   . GERD (gastroesophageal reflux disease)   . Lung nodule    lower  . Normal echocardiogram    normal lv function  . Thrombocytopenia (HCC)    platelet count 80,000    Patient Active Problem List   Diagnosis Date Noted  . Guaiac positive stools 08/11/2016  . Family hx of colon cancer 08/11/2016  . Ischemic chest pain ([place]) 06/12/2015  . Mitral regurgitation 04/22/2012  . Hypercholesterolemia 10/08/2011  . Valvular disease 04/21/2011  . Dyslipidemia 04/21/2011  . ESSENTIAL HYPERTENSION, BENIGN 10/29/2009  . EDEMA LEG 10/29/2009  . ANEMIA, NORMOCYTIC 10/29/2008  . THROMBOCYTOPENIA, CHRONIC 10/29/2008  . CORONARY ATHEROSCLEROSIS NATIVE CORONARY ARTERY 10/29/2008  . LBBB 10/29/2008  . LUNG NODULE 10/29/2008  . CHEST PAIN, EXERTIONAL 10/29/2008    Past Surgical History:  Procedure Laterality Date  . CARDIAC CATHETERIZATION N/A 06/12/2015   Procedure: Left Heart Cath and Cors/Grafts Angiography;   Surgeon: Sherren Mocha, MD;  Location: [facility];  Service: Cardiovascular;  Laterality: N/A;  . COLONOSCOPY N/A 10/07/2016   Procedure: COLONOSCOPY;  Surgeon: Rogene Houston, MD;  [facility];  Service: Endoscopy;  Laterality: N/A;  2:45  . CORONARY ARTERY BYPASS GRAFT  11/04/2008     Ivin Poot, M.D.    OB History    No data available       Home Medications    Prior to Admission medications   Medication Sig Start Date End Date Taking? Authorizing Provider  aspirin 81 MG tablet Take 81 mg by mouth daily.     Historical Provider, MD  Calcium Carbonate-Vit D-Min 600-400 MG-UNIT TABS Take 1 tablet by mouth daily.      Historical Provider, MD  cycloSPORINE (RESTASIS) 0.05 % ophthalmic emulsion Place 1 drop into both eyes 2 times daily. 05/13/16   Historical Provider, MD  hydrochlorothiazide (MICROZIDE) 12.5 MG capsule Take 1 capsule (12.5 mg total) by mouth daily. Patient taking differently: Take 12.5 mg by mouth as needed.  05/11/16   Sherren Mocha, MD  isosorbide mononitrate (IMDUR) 120 MG 24 hr tablet Take 1 tablet (120 mg total) by mouth daily. 05/11/16   Sherren Mocha, MD  metoprolol (LOPRESSOR) 50 MG tablet TAKE ONE & ONE-HALF TABLETS BY MOUTH TWICE DAILY Patient taking differently: Takes one in am and sometimes in the evening 07/01/16   Sherren Mocha, MD  Multiple  Vitamins-Minerals (MULTIVITAMIN WITH MINERALS) tablet Take 1 tablet by mouth daily.      Historical Provider, MD  pantoprazole (PROTONIX) 40 MG tablet Take 1 tablet (40 mg total) by mouth daily before breakfast. 10/26/16   Rogene Houston, MD  potassium chloride (K-DUR) 10 MEQ tablet Take 1 tablet (10 mEq total) by mouth daily. Patient taking differently: Take 10 mEq by mouth as needed.  05/11/16   Sherren Mocha, MD  ranitidine (ZANTAC) 150 MG tablet Take 1 tablet (150 mg total) by mouth at bedtime as needed for heartburn. 10/26/16   Rogene Houston, MD  simvastatin (ZOCOR) 40 MG tablet Take 1 tablet  (40 mg total) by mouth at bedtime. 05/11/16   Sherren Mocha, MD    Family History Family History  Problem Relation Age of Onset  . Heart failure Mother   . Heart failure Father   . CAD Sister     Social History Social History  Substance Use Topics  . Smoking status: Never Smoker  . Smokeless tobacco: Never Used  . Alcohol use No     Allergies   Patient has no known allergies.   Review of Systems Review of Systems 10/14 systems reviewed and are negative other than those stated in the HPI  Physical Exam Updated Vital Signs BP 160/88 (BP Location: Right Arm)   Pulse 93   Temp 98.6 F (37 C) (Oral)   Resp 12   Ht 5\' 6"  (1.676 m)   Wt 110 lb (49.9 kg)   SpO2 97%   BMI 17.75 kg/m   Physical Exam Physical Exam  Nursing note and vitals reviewed. Constitutional: non-toxic, and in no acute distress Head: Normocephalic. Superficial abrasions to upper and lower lip. Mild soft tissue swelling over left maxilla, no deformities.  Eyes: PERRL, EOMI  Mouth/Throat: Oropharynx is clear and moist. no malocclusion Neck: Normal range of motion. Neck supple. no cervical spine tenderness Cardiovascular: Normal rate and regular rhythm.   Pulmonary/Chest: Effort normal and breath sounds normal. no chest wall tenderness Abdominal: Soft. There is no tenderness. There is no rebound and no guarding.  Musculoskeletal: Normal range of motion of all 4 extremities. No TLS spine tenderness. No pelvic or hip tenderness.  Neurological: Alert, no facial droop, fluent speech, moves all extremities symmetrically Skin: Skin is warm and dry.  Psychiatric: Cooperative   ED Treatments / Results  Labs (all labs ordered are listed, but only abnormal results are displayed) Labs Reviewed - No data to display  EKG  EKG Interpretation None       Radiology Ct Head Wo Contrast  Result Date: 11/28/2016 CLINICAL DATA:  Fall in parking lot with head and facial injury, initial encounter EXAM: CT  HEAD WITHOUT CONTRAST CT MAXILLOFACIAL WITHOUT CONTRAST CT CERVICAL SPINE WITHOUT CONTRAST TECHNIQUE: Multidetector CT imaging of the head, cervical spine, and maxillofacial structures were performed using the standard protocol without intravenous contrast. Multiplanar CT image reconstructions of the cervical spine and maxillofacial structures were also generated. COMPARISON:  None. FINDINGS: CT HEAD FINDINGS Brain: No evidence of acute infarction, hemorrhage, hydrocephalus, extra-axial collection or mass lesion/mass effect. Diffuse atrophic changes are noted. Vascular: No hyperdense vessel or unexpected calcification. Skull: Normal. Negative for fracture or focal lesion. Other: Fluid attenuation is noted in the mastoid air cells bilaterally. CT MAXILLOFACIAL FINDINGS Osseous: No fracture or mandibular dislocation. No destructive process. Mild degenerative changes of the temporomandibular joints is seen. Orbits: Negative. No traumatic or inflammatory finding. Sinuses: Clear. Soft tissues: Negative. CT CERVICAL SPINE  FINDINGS Alignment: Well-maintained Skull base and vertebrae: 7 cervical segments are well visualized. Vertebral body height is well maintained. Significant osteophytic changes are noted at C6-7. Facet hypertrophic changes without acute fracture or acute facet abnormality are noted. Soft tissues and spinal canal: No prevertebral fluid or swelling. No visible canal hematoma. Disc levels: Disc space narrowing is noted at C5-6 with osteophytic changes. No significant central canal stenosis is noted. Upper chest: Negative. IMPRESSION: CT of the head: No acute intracranial abnormality noted. Mild atrophic changes are seen. Fluid attenuation is noted in the mastoid air cells bilaterally of uncertain chronicity. CT of maxillofacial bones: No acute fracture is noted. CT of the cervical spine: Multilevel degenerative change without acute abnormality. Electronically Signed   By: Inez Catalina M.D.   On: 11/28/2016  19:15   Ct Cervical Spine Wo Contrast  Result Date: 11/28/2016 CLINICAL DATA:  Fall in parking lot with head and facial injury, initial encounter EXAM: CT HEAD WITHOUT CONTRAST CT MAXILLOFACIAL WITHOUT CONTRAST CT CERVICAL SPINE WITHOUT CONTRAST TECHNIQUE: Multidetector CT imaging of the head, cervical spine, and maxillofacial structures were performed using the standard protocol without intravenous contrast. Multiplanar CT image reconstructions of the cervical spine and maxillofacial structures were also generated. COMPARISON:  None. FINDINGS: CT HEAD FINDINGS Brain: No evidence of acute infarction, hemorrhage, hydrocephalus, extra-axial collection or mass lesion/mass effect. Diffuse atrophic changes are noted. Vascular: No hyperdense vessel or unexpected calcification. Skull: Normal. Negative for fracture or focal lesion. Other: Fluid attenuation is noted in the mastoid air cells bilaterally. CT MAXILLOFACIAL FINDINGS Osseous: No fracture or mandibular dislocation. No destructive process. Mild degenerative changes of the temporomandibular joints is seen. Orbits: Negative. No traumatic or inflammatory finding. Sinuses: Clear. Soft tissues: Negative. CT CERVICAL SPINE FINDINGS Alignment: Well-maintained Skull base and vertebrae: 7 cervical segments are well visualized. Vertebral body height is well maintained. Significant osteophytic changes are noted at C6-7. Facet hypertrophic changes without acute fracture or acute facet abnormality are noted. Soft tissues and spinal canal: No prevertebral fluid or swelling. No visible canal hematoma. Disc levels: Disc space narrowing is noted at C5-6 with osteophytic changes. No significant central canal stenosis is noted. Upper chest: Negative. IMPRESSION: CT of the head: No acute intracranial abnormality noted. Mild atrophic changes are seen. Fluid attenuation is noted in the mastoid air cells bilaterally of uncertain chronicity. CT of maxillofacial bones: No acute  fracture is noted. CT of the cervical spine: Multilevel degenerative change without acute abnormality. Electronically Signed   By: Inez Catalina M.D.   On: 11/28/2016 19:15   Ct Maxillofacial Wo Contrast  Result Date: 11/28/2016 CLINICAL DATA:  Fall in parking lot with head and facial injury, initial encounter EXAM: CT HEAD WITHOUT CONTRAST CT MAXILLOFACIAL WITHOUT CONTRAST CT CERVICAL SPINE WITHOUT CONTRAST TECHNIQUE: Multidetector CT imaging of the head, cervical spine, and maxillofacial structures were performed using the standard protocol without intravenous contrast. Multiplanar CT image reconstructions of the cervical spine and maxillofacial structures were also generated. COMPARISON:  None. FINDINGS: CT HEAD FINDINGS Brain: No evidence of acute infarction, hemorrhage, hydrocephalus, extra-axial collection or mass lesion/mass effect. Diffuse atrophic changes are noted. Vascular: No hyperdense vessel or unexpected calcification. Skull: Normal. Negative for fracture or focal lesion. Other: Fluid attenuation is noted in the mastoid air cells bilaterally. CT MAXILLOFACIAL FINDINGS Osseous: No fracture or mandibular dislocation. No destructive process. Mild degenerative changes of the temporomandibular joints is seen. Orbits: Negative. No traumatic or inflammatory finding. Sinuses: Clear. Soft tissues: Negative. CT CERVICAL SPINE FINDINGS Alignment: Well-maintained  Skull base and vertebrae: 7 cervical segments are well visualized. Vertebral body height is well maintained. Significant osteophytic changes are noted at C6-7. Facet hypertrophic changes without acute fracture or acute facet abnormality are noted. Soft tissues and spinal canal: No prevertebral fluid or swelling. No visible canal hematoma. Disc levels: Disc space narrowing is noted at C5-6 with osteophytic changes. No significant central canal stenosis is noted. Upper chest: Negative. IMPRESSION: CT of the head: No acute intracranial abnormality  noted. Mild atrophic changes are seen. Fluid attenuation is noted in the mastoid air cells bilaterally of uncertain chronicity. CT of maxillofacial bones: No acute fracture is noted. CT of the cervical spine: Multilevel degenerative change without acute abnormality. Electronically Signed   By: Inez Catalina M.D.   On: 11/28/2016 19:15    Procedures Procedures (including critical care time)  Medications Ordered in ED Medications  acetaminophen (TYLENOL) tablet 1,000 mg (1,000 mg Oral Given 11/28/16 1736)     Initial Impression / Assessment and Plan / ED Course  I have reviewed the triage vital signs and the nursing notes.  Pertinent labs & imaging results that were available during my care of the patient were reviewed by me and considered in my medical decision making (see chart for details).  Clinical Course     Presenting after mechanical fall. No blood thinners. Well appearing and in no acute distress with stable vital signs. Superficial abrasions over face and bilateral hands. Neuro in tact. Has been ambulatory since her fall. CT head, face, c-spine visualized. There is no acute injuries involving the head, face or cervical spine. Stable for discharge home with supportive care instructions. Strict return and follow-up instructions reviewed. She expressed understanding of all discharge instructions and felt comfortable with the plan of care.   Final Clinical Impressions(s) / ED Diagnoses   Final diagnoses:  Fall, initial encounter  Skin abrasion    New Prescriptions New Prescriptions   No medications on file     Forde Dandy, MD 11/28/16 [phone]

## 2016-11-28 NOTE — Discharge Instructions (Signed)
Your CT scan does not show serious injury of the head, face or neck. Continue to take Tylenol for pain control, and ice for swelling and bruising.   Return without fail for worsening symptoms, including escalating pain, confusion, intractable vomiting, or any other symptoms concerning to you

## 2016-11-28 NOTE — ED Notes (Signed)
Charge RN spoke with pt regarding concerns.  Pt has not spoken with security for report and wants to know why they were not notified earlier.  Informed pt that security was notified on her arrival at [facility] by Roselyn Reef, RN and that I just spoke with security and he said he was on his way to do report.  Pt upset and wanting to leave and request it be documented that she waited 3 hours to talk with security.  Apologized multiple times to patient and encouraged her to wait and that security on the way to do report.  Called security on radio to confirm again.  Security arrived at bedside and report taken.

## 2016-11-28 NOTE — ED Triage Notes (Signed)
Pt states she tripped over speed bump in parking deck at hospital.  Denies LOC.  C/o pain to L side of face/head,lip laceration, and abrasions to hands.  Denies neck and back pain.

## 2016-11-28 NOTE — ED Notes (Signed)
Pt refused to sign.  

## 2016-11-28 NOTE — ED Notes (Signed)
Pt refused to sign e signature due to discharge paperwork not stating that she fell on  property. RN explained to the pt that it would state in her medical records that she fell on the property.

## 2017-02-07 ENCOUNTER — Encounter: Payer: Self-pay | Admitting: Cardiovascular Disease

## 2017-02-07 NOTE — Telephone Encounter (Signed)
New message   Pt is requesting to be worked in the last week of March. Per pt she is off all that week, and needs to see Dr. Burt Knack that week. Added pt to wait list, and let her know I would let the nurse know.

## 2017-02-07 NOTE — Telephone Encounter (Signed)
This encounter was created in error - please disregard.

## 2017-03-25 ENCOUNTER — Ambulatory Visit (INDEPENDENT_AMBULATORY_CARE_PROVIDER_SITE_OTHER): Payer: Medicare Other | Admitting: Cardiovascular Disease

## 2017-03-25 ENCOUNTER — Encounter: Payer: Self-pay | Admitting: Cardiovascular Disease

## 2017-03-25 VITALS — BP 126/64 | HR 82 | Ht 64.0 in | Wt 126.6 lb

## 2017-03-25 DIAGNOSIS — E785 Hyperlipidemia, unspecified: Secondary | ICD-10-CM

## 2017-03-25 DIAGNOSIS — I25119 Atherosclerotic heart disease of native coronary artery with unspecified angina pectoris: Secondary | ICD-10-CM

## 2017-03-25 LAB — LIPID PANEL
CHOLESTEROL TOTAL: 186 mg/dL (ref 100–199)
Chol/HDL Ratio: 4.8 ratio — ABNORMAL HIGH (ref 0.0–4.4)
HDL: 39 mg/dL — AB (ref >39)
LDL Calculated: 108 mg/dL — ABNORMAL HIGH (ref 0–99)
TRIGLYCERIDES: 194 mg/dL — AB (ref 0–149)
VLDL Cholesterol Cal: 39 mg/dL (ref 5–40)

## 2017-03-25 LAB — COMPREHENSIVE METABOLIC PANEL
A/G RATIO: 2.3 — AB (ref 1.2–2.2)
ALT: 18 IU/L (ref 0–32)
AST: 16 IU/L (ref 0–40)
Albumin: 4.5 g/dL (ref 3.5–4.7)
Alkaline Phosphatase: 64 IU/L (ref 39–117)
BUN / CREAT RATIO: 16 (ref 12–28)
BUN: 15 mg/dL (ref 8–27)
Bilirubin Total: 0.5 mg/dL (ref 0.0–1.2)
CALCIUM: 9.4 mg/dL (ref 8.7–10.3)
CO2: 19 mmol/L (ref 18–29)
Chloride: 101 mmol/L (ref 96–106)
Creatinine, Ser: 0.96 mg/dL (ref 0.57–1.00)
GFR calc non Af Amer: 56 mL/min/{1.73_m2} — ABNORMAL LOW (ref >59)
GFR, EST AFRICAN AMERICAN: 65 mL/min/{1.73_m2} (ref >59)
GLOBULIN, TOTAL: 2 g/dL (ref 1.5–4.5)
Glucose: 133 mg/dL — ABNORMAL HIGH (ref 65–99)
POTASSIUM: 4 mmol/L (ref 3.5–5.2)
SODIUM: 139 mmol/L (ref 134–144)
TOTAL PROTEIN: 6.5 g/dL (ref 6.0–8.5)

## 2017-03-25 MED ORDER — METOPROLOL TARTRATE 50 MG PO TABS: 50.0000 mg | ORAL_TABLET | Freq: Two times a day (BID) | ORAL | 3 refills | Status: DC

## 2017-03-25 MED ORDER — POTASSIUM CHLORIDE ER 10 MEQ PO TBCR: 10.0000 meq | EXTENDED_RELEASE_TABLET | Freq: Every day | ORAL | 3 refills | Status: DC

## 2017-03-25 MED ORDER — ISOSORBIDE MONONITRATE ER 120 MG PO TB24: 120.0000 mg | ORAL_TABLET | Freq: Every day | ORAL | 3 refills | Status: DC

## 2017-03-25 MED ORDER — SIMVASTATIN 40 MG PO TABS: 40.0000 mg | ORAL_TABLET | Freq: Every day | ORAL | 3 refills | Status: DC

## 2017-03-25 MED ORDER — HYDROCHLOROTHIAZIDE 12.5 MG PO CAPS: 12.5000 mg | ORAL_CAPSULE | Freq: Every day | ORAL | 3 refills | Status: DC | PRN

## 2017-03-25 NOTE — Patient Instructions (Signed)
Medication Instructions:  Your physician recommends that you continue on your current medications as directed. Please refer to the Current Medication list given to you today.  Labwork: Your physician recommends that you have lab work today: LIPID and CMP  Testing/Procedures: No new orders.   Follow-Up: Your physician wants you to follow-up in: 1 YEAR with Dr Cooper. You will receive a reminder letter in the mail two months in advance. If you don't receive a letter, please call our office to schedule the follow-up appointment.   Any Other Special Instructions Will Be Listed Below (If Applicable).     If you need a refill on your cardiac medications before your next appointment, please call your pharmacy.   

## 2017-03-25 NOTE — Progress Notes (Signed)
Cardiology Office Note Date:  03/25/2017   ID:  [name] Graham, DOB Jul 31, 1934, MRN [id number]  PCP:  Glenda Chroman, MD  Cardiologist:  Sherren Mocha, MD    Chief Complaint  Patient presents with  . Follow-up    CAD     History of Present Illness: [name] Graham is a 83 y.o. female who presents for follow-up evaluation.   She has a history of coronary artery disease status post CABG in 2009. She underwent a heart catheterization in 2014 to evaluate hemodynamics and coronary anatomy in the setting of anginal symptoms and shortness of breath. Her right heart pressures and hemodynamics were normal. The LAD showed stable diffuse moderate proximal vessel stenosis unchanged from 2009. Her graft anatomy was stable with patency of the vein graft to diagonal and atresia of the LIMA graft.  [name] Graham 2 of her sisters in with her today. She is really doing quite well. She hasn't had any recent problems with chest pain or chest pressure. Denies shortness of breath, edema, or heart palpitations. She's been compliant with her medications. 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  Past Medical History:  Diagnosis Date  . Anemia    mild normocytic anemia  . Anxiety   . Bundle branch block   . Coronary artery disease    single vessel of the LAD  . Exertional angina (HCC)   . GERD (gastroesophageal reflux disease)   . Lung nodule    lower  . Normal echocardiogram    normal lv function  . Thrombocytopenia (HCC)    platelet count 80,000    Past Surgical History:  Procedure Laterality Date  . CARDIAC CATHETERIZATION N/A 06/12/2015   Procedure: Left Heart Cath and Cors/Grafts Angiography;  Surgeon: Sherren Mocha, MD;  Location: [facility];  Service: Cardiovascular;  Laterality: N/A;  . COLONOSCOPY N/A 10/07/2016   Procedure: COLONOSCOPY;  Surgeon: Rogene Houston, MD;  [facility];  Service: Endoscopy;   Laterality: N/A;  2:45  . CORONARY ARTERY BYPASS GRAFT  11/04/2008     Ivin Poot, M.D.    Current Outpatient Prescriptions  Medication Sig Dispense Refill  . aspirin 81 MG tablet Take 81 mg by mouth daily.     . Calcium Carbonate-Vit D-Min 600-400 MG-UNIT TABS Take 1 tablet by mouth daily.      . cycloSPORINE (RESTASIS) 0.05 % ophthalmic emulsion Place 1 drop into both eyes 2 times daily.    . hydrochlorothiazide (MICROZIDE) 12.5 MG capsule Take 1 capsule (12.5 mg total) by mouth daily as needed (swelling). 90 capsule 3  . isosorbide mononitrate (IMDUR) 120 MG 24 hr tablet Take 1 tablet (120 mg total) by mouth daily. 90 tablet 3  . metoprolol (LOPRESSOR) 50 MG tablet Take 1 tablet (50 mg total) by mouth 2 (two) times daily. 180 tablet 3  . Multiple Vitamins-Minerals (MULTIVITAMIN WITH MINERALS) tablet Take 1 tablet by mouth daily.      . pantoprazole (PROTONIX) 40 MG tablet Take 1 tablet (40 mg total) by mouth daily before breakfast. 30 tablet 5  . potassium chloride (K-DUR) 10 MEQ tablet Take 1 tablet (10 mEq total) by mouth daily. 90 tablet 3  . ranitidine (ZANTAC) 150 MG tablet Take 1 tablet (150 mg total) by mouth at bedtime as needed for heartburn.    . simvastatin (ZOCOR) 40 MG tablet Take 1 tablet (40 mg total) by mouth  at bedtime. 90 tablet 3   No current facility-administered medications for this visit.     Allergies:   Patient has no known allergies.   Social History:  The patient  reports that she has never smoked. She has never used smokeless tobacco. She reports that she does not drink alcohol or use drugs.   Family History:  The patient's  family history includes CAD in her sister; Heart failure in her father and mother.    ROS:  Please see the history of present illness.  Otherwise, review of systems is positive for no other complaints.  All other systems are reviewed and negative.    PHYSICAL EXAM: VS:  BP 126/64   Pulse 82   Ht '5\' 4"'  (1.626 m)   Wt 126 lb  9.6 oz (57.4 kg)   BMI 21.73 kg/m  , BMI Body mass index is 21.73 kg/m. GEN: Well nourished, well developed, pleasant elderly woman in no acute distress  HEENT: normal  Neck: no JVD, no masses. No carotid bruits Cardiac: RRR without murmur or gallop                Respiratory:  clear to auscultation bilaterally, normal work of breathing GI: soft, nontender, nondistended, + BS MS: no deformity or atrophy  Ext: no pretibial edema, pedal pulses 2+= bilaterally Skin: warm and dry, no rash Neuro:  Strength and sensation are intact Psych: euthymic mood, full affect  EKG:  EKG is ordered today. The ekg ordered today shows Normal sinus rhythm 82 bpm, left axis deviation, left bundle branch block..  Recent Labs: No results found for requested labs within last 8760 hours.   Lipid Panel     Component Value Date/Time   CHOL 174 12/19/2014 0941   TRIG 269.0 (H) 12/19/2014 0941   HDL 30.10 (L) 12/19/2014 0941   CHOLHDL 6 12/19/2014 0941   VLDL 53.8 (H) 12/19/2014 0941   LDLCALC (H) 10/30/2008 0610    118        Total Cholesterol/HDL:CHD Risk Coronary Heart Disease Risk Table                     Men   Women  1/2 Average Risk   3.4   3.3   LDLDIRECT 109.0 12/19/2014 0941      Wt Readings from Last 3 Encounters:  03/25/17 126 lb 9.6 oz (57.4 kg)  11/28/16 110 lb (49.9 kg)  10/26/16 122 lb (55.3 kg)     Cardiac Studies Reviewed: 06-12-2015 Cardiac Cath:    Conclusion    1. Prox RCA lesion, 30% stenosed. 2. Prox LAD lesion, 50% stenosed. 3. was injected is small. 4. There is mild disease in the graft. 5. There is mild diffuse vein graft irregularity unchanged from the previous study 6. The left ventricular systolic function is normal.  FINAL CONCLUSIONS:  MILD-MODERATE DIFFUSE PROXIMAL AND MID-LAD STENOSIS  WIDELY PATENT LCX  PATENT RCA, INITIALLY WITH PROXIMAL VASOSPASM RESOLVED WITH IC NTG  VIGOROUS/NORMAL LV FUNCTION  STABLE/IMPROVED FINDINGS FROM PRIOR CATH  STUDIES  RECOMMEND CONTINUED MEDICAL MANAGEMENT     ASSESSMENT AND PLAN: 1.   Coronary artery disease, native vessel, with angina. Symptoms are currently very well controlled on a combination of isosorbide and metoprolol. No changes are made today. At present she has CCS functional class I symptoms.  2. Essential hypertension: Blood pressure is well controlled on a combination of hydrochlorothiazide, isosorbide, and metoprolol.  3. Hyperlipidemia: She will continue on simvastatin. Will draw  labs today for an updated lipid panel, metabolic panel, and LFTs.  Current medicines are reviewed with the patient today.  The patient does not have concerns regarding medicines.  Labs/ tests ordered today include:   Orders Placed This Encounter  Procedures  . Lipid panel  . Comp Met (CMET)  . EKG 12-Lead    Disposition:   FU one year  Signed, Sherren Mocha, MD  03/25/2017 1:32 PM    [facility] [address], [place], [place]  [address] Phone: [phone]; Fax: [phone]

## 2017-04-25 ENCOUNTER — Ambulatory Visit (INDEPENDENT_AMBULATORY_CARE_PROVIDER_SITE_OTHER): Payer: Medicare Other | Admitting: Internal Medicine

## 2017-04-26 ENCOUNTER — Ambulatory Visit (INDEPENDENT_AMBULATORY_CARE_PROVIDER_SITE_OTHER): Payer: Medicare Other | Admitting: Internal Medicine

## 2017-05-09 ENCOUNTER — Ambulatory Visit (INDEPENDENT_AMBULATORY_CARE_PROVIDER_SITE_OTHER): Payer: Medicare Other | Admitting: Internal Medicine

## 2017-06-21 ENCOUNTER — Ambulatory Visit (INDEPENDENT_AMBULATORY_CARE_PROVIDER_SITE_OTHER): Payer: Medicare Other

## 2017-06-21 ENCOUNTER — Ambulatory Visit (INDEPENDENT_AMBULATORY_CARE_PROVIDER_SITE_OTHER): Payer: Medicare Other | Admitting: Orthopaedic Surgery

## 2017-06-21 ENCOUNTER — Encounter: Payer: Self-pay | Admitting: Orthopaedic Surgery

## 2017-06-21 VITALS — BP 141/81 | HR 87 | Temp 97.3°F | Ht 63.0 in | Wt 124.0 lb

## 2017-06-21 DIAGNOSIS — G8929 Other chronic pain: Secondary | ICD-10-CM | POA: Diagnosis not present

## 2017-06-21 DIAGNOSIS — M79642 Pain in left hand: Secondary | ICD-10-CM

## 2017-06-21 DIAGNOSIS — M2550 Pain in unspecified joint: Secondary | ICD-10-CM | POA: Diagnosis not present

## 2017-06-21 DIAGNOSIS — M25562 Pain in left knee: Secondary | ICD-10-CM

## 2017-06-21 DIAGNOSIS — H26493 Other secondary cataract, bilateral: Secondary | ICD-10-CM | POA: Insufficient documentation

## 2017-06-21 DIAGNOSIS — Z961 Presence of intraocular lens: Secondary | ICD-10-CM | POA: Insufficient documentation

## 2017-06-21 NOTE — Progress Notes (Signed)
Subjective:    Patient ID: [name] Graham, female    DOB: February 14, 1934, 83 y.o.   MRN: [id number]  HPI She has long history of pain in multiple joints:  The right hip area, the right and left knee and the left hand.  The left knee and left hand hurt the most.  She has seen Dr. Woody Seller.  She has no trauma, no redness, no swelling, no giving way, no numbness.  She has tried Tylenol and Advil with some relief but she does not like taking any medicine.  The left knee hurts more than all the other joints.   Review of Systems  HENT: Negative for congestion.   Respiratory: Negative for cough and shortness of breath.   Cardiovascular: Negative for chest pain and leg swelling.  Endocrine: Positive for cold intolerance.  Musculoskeletal: Positive for arthralgias and gait problem.  Allergic/Immunologic: Positive for environmental allergies.  Psychiatric/Behavioral: The patient is nervous/anxious.    Past Medical History:  Diagnosis Date  . Anemia    mild normocytic anemia  . Anxiety   . Bundle branch block   . Coronary artery disease    single vessel of the LAD  . Exertional angina (HCC)   . GERD (gastroesophageal reflux disease)   . Lung nodule    lower  . Normal echocardiogram    normal lv function  . Thrombocytopenia (HCC)    platelet count 80,000    Past Surgical History:  Procedure Laterality Date  . CARDIAC CATHETERIZATION N/A 06/12/2015   Procedure: Left Heart Cath and Cors/Grafts Angiography;  Surgeon: Sherren Mocha, MD;  Location: [facility];  Service: Cardiovascular;  Laterality: N/A;  . COLONOSCOPY N/A 10/07/2016   Procedure: COLONOSCOPY;  Surgeon: Rogene Houston, MD;  [facility];  Service: Endoscopy;  Laterality: N/A;  2:45  . CORONARY ARTERY BYPASS GRAFT  11/04/2008     Ivin Poot, M.D.    Current Outpatient Prescriptions on File Prior to Visit  Medication Sig Dispense Refill  . aspirin 81 MG tablet Take 81 mg by mouth daily.     . Calcium  Carbonate-Vit D-Min 600-400 MG-UNIT TABS Take 1 tablet by mouth daily.      . cycloSPORINE (RESTASIS) 0.05 % ophthalmic emulsion Place 1 drop into both eyes 2 times daily.    . hydrochlorothiazide (MICROZIDE) 12.5 MG capsule Take 1 capsule (12.5 mg total) by mouth daily as needed (swelling). 90 capsule 3  . isosorbide mononitrate (IMDUR) 120 MG 24 hr tablet Take 1 tablet (120 mg total) by mouth daily. 90 tablet 3  . metoprolol (LOPRESSOR) 50 MG tablet Take 1 tablet (50 mg total) by mouth 2 (two) times daily. 180 tablet 3  . Multiple Vitamins-Minerals (MULTIVITAMIN WITH MINERALS) tablet Take 1 tablet by mouth daily.      . pantoprazole (PROTONIX) 40 MG tablet Take 1 tablet (40 mg total) by mouth daily before breakfast. 30 tablet 5  . potassium chloride (K-DUR) 10 MEQ tablet Take 1 tablet (10 mEq total) by mouth daily. 90 tablet 3  . ranitidine (ZANTAC) 150 MG tablet Take 1 tablet (150 mg total) by mouth at bedtime as needed for heartburn.    . simvastatin (ZOCOR) 40 MG tablet Take 1 tablet (40 mg total) by mouth at bedtime. 90 tablet 3   No current facility-administered medications on file prior to visit.     Social History   Social History  . Marital status: Married    Spouse name: N/A  .  Number of children: N/A  . Years of education: N/A   Occupational History  . RETIRED    Social History Main Topics  . Smoking status: Never Smoker  . Smokeless tobacco: Never Used  . Alcohol use No  . Drug use: No  . Sexual activity: Not on file   Other Topics Concern  . Not on file   Social History Narrative  . No narrative on file    Family History  Problem Relation Age of Onset  . Heart failure Mother   . Heart failure Father   . CAD Sister     BP (!) 141/81   Pulse 87   Temp (!) 97.3 F (36.3 C)   Ht 5\' 3"  (1.6 m)   Wt 124 lb (56.2 kg)   BMI 21.97 kg/m      Objective:   Physical Exam  Constitutional: She is oriented to person, place, and time. She appears well-developed  and well-nourished.  HENT:  Head: Normocephalic and atraumatic.  Eyes: Pupils are equal, round, and reactive to light. Conjunctivae and EOM are normal.  Neck: Normal range of motion. Neck supple.  Cardiovascular: Normal rate, regular rhythm and intact distal pulses.   Pulmonary/Chest: Effort normal.  Abdominal: Soft.  Musculoskeletal: She exhibits tenderness (Both knees are tender with no effusion, slight crepitus, slight left limp, ROM full.  Left hand with PIP joint swelling, full ROM, NV intact.  Good grips.  Hips with good motion.  Neck negative.  Shoulders negative.).  Neurological: She is alert and oriented to person, place, and time. She displays normal reflexes. No cranial nerve deficit. She exhibits normal muscle tone. Coordination normal.  Skin: Skin is warm and dry.  Psychiatric: She has a normal mood and affect. Her behavior is normal. Judgment and thought content normal.  Vitals reviewed.   X-rays were done of the left knee and left hand, reported separately.      Assessment & Plan:   Encounter Diagnoses  Name Primary?  . Chronic pain of left knee Yes  . Hand pain, left   . Chronic pain of multiple joints    I have recommended Aleve one bid pc.  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  She says she needs to be fully active and able to walk and stand.  Return in two weeks.  X-rays of the right hip on return.  Call if any problem.  Precautions discussed.   Electronically Signed Sanjuana Kava, MD 7/17/20189:42 AM

## 2017-06-28 ENCOUNTER — Encounter (HOSPITAL_COMMUNITY): Payer: Self-pay | Admitting: *Deleted

## 2017-06-28 ENCOUNTER — Ambulatory Visit (HOSPITAL_COMMUNITY)
Admission: RE | Admit: 2017-06-28 | Discharge: 2017-06-28 | Disposition: A | Discharge status: Home / Self Care | Payer: Medicare Other | Source: Ambulatory Visit | Attending: Ophthalmology | Admitting: Ophthalmology

## 2017-06-28 ENCOUNTER — Encounter (HOSPITAL_COMMUNITY)
Admission: RE | Disposition: A | Discharge status: Home / Self Care | Payer: Self-pay | Source: Ambulatory Visit | Attending: Ophthalmology

## 2017-06-28 DIAGNOSIS — H26491 Other secondary cataract, right eye: Secondary | ICD-10-CM | POA: Diagnosis present

## 2017-06-28 HISTORY — PX: YAG LASER APPLICATION: SHX6189

## 2017-06-28 SURGERY — TREATMENT, USING YAG LASER
Anesthesia: LOCAL | Laterality: Right

## 2017-06-28 MED ORDER — CYCLOPENTOLATE-PHENYLEPHRINE 0.2-1 % OP SOLN
1.0000 [drp] | OPHTHALMIC | Status: AC
  Administered 2017-06-28 (×3): 1 [drp] via OPHTHALMIC

## 2017-06-28 NOTE — H&P (Signed)
The patient was re examined and there is no change in the patients condition since the original H and P. 

## 2017-06-28 NOTE — Op Note (Signed)
[name] Wolven [name], MD  Procedure: Yag Capsulotomy  Yag Laser Self Test Completedyes. Procedure: Posterior Capsulotomy, Eye Protection Worn by Staff yes. Laser In Use Sign on Door yes.  Laser: Nd:YAG Spot Size: Fixed Burst Mode: III Power Setting: 3.4 mJ/burst Number of shots: 30 Total energy delivered: 94.30 mJ   The patient tolerated the procedure without difficulty. No complications were encountered.   The patient was discharged home with the instructions to continue all her current glaucoma medications, if any.   Patient instructed to go to office at 0100 for intraocular pressure check.  Patient verbalizes understanding of discharge instructions Yes.  .    Pre-Operative Diagnosis: After-Cataract, obscuring vision, 366.53 OD Post-Operative Diagnosis: After-Cataract, obscuring vision, 366.53 OD Date of Cataract Surgery: 11/03/2011

## 2017-06-28 NOTE — Discharge Instructions (Signed)
[facility]  06/28/2017     Instructions    Activity: No Restrictions.   Diet: Resume Diet you were on at home.   Pain Medication: Tylenol if Needed.   CONTACT YOUR DOCTOR IF YOU HAVE PAIN, REDNESS IN YOUR EYE, OR DECREASED VISION.   Follow-up: at 1pm today with Rutherford Guys, MD at his office.   Dr. Gershon Crane: [phone]  Dr. Iona Hansen: [phone]  Dr. Geoffry Paradise: [phone]   If you find that you cannot contact your physician, but feel that your signs and   Symptoms warrant a physician's attention, call the Emergency Room at   [phone] ext.[phone].

## 2017-07-01 ENCOUNTER — Encounter (HOSPITAL_COMMUNITY): Payer: Self-pay | Admitting: Ophthalmology

## 2017-07-05 ENCOUNTER — Ambulatory Visit (INDEPENDENT_AMBULATORY_CARE_PROVIDER_SITE_OTHER): Payer: Medicare Other | Admitting: Orthopaedic Surgery

## 2017-07-05 ENCOUNTER — Encounter: Payer: Self-pay | Admitting: Orthopaedic Surgery

## 2017-07-05 ENCOUNTER — Ambulatory Visit (INDEPENDENT_AMBULATORY_CARE_PROVIDER_SITE_OTHER): Payer: Medicare Other

## 2017-07-05 VITALS — BP 141/74 | HR 81 | Temp 97.0°F | Ht 63.0 in | Wt 125.0 lb

## 2017-07-05 DIAGNOSIS — M2550 Pain in unspecified joint: Secondary | ICD-10-CM | POA: Diagnosis not present

## 2017-07-05 DIAGNOSIS — M25562 Pain in left knee: Secondary | ICD-10-CM | POA: Diagnosis not present

## 2017-07-05 DIAGNOSIS — G8929 Other chronic pain: Secondary | ICD-10-CM | POA: Diagnosis not present

## 2017-07-05 DIAGNOSIS — M25551 Pain in right hip: Secondary | ICD-10-CM

## 2017-07-05 DIAGNOSIS — M79642 Pain in left hand: Secondary | ICD-10-CM | POA: Diagnosis not present

## 2017-07-05 MED ORDER — PREDNISONE 5 MG (21) PO TBPK: ORAL_TABLET | ORAL | 0 refills | Status: DC

## 2017-07-05 NOTE — Progress Notes (Signed)
Patient [name] Graham, female DOB:Jan 01, 1934, 83 y.o. WGN:[id number]  Chief Complaint  Patient presents with  . Follow-up    Right Hip    HPI  [name] Graham is a 83 y.o. female who has multiple joint pains as well as right hip pain.  She has no new trauma. 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  She has been taking her medicine with little help.  I have suggested Aspercreme for the fingers of her hands.  I have suggested trial of prednisone dose pack for her back and knees and hip which I will write today.  She has no new trauma, no paresthesias. HPI  Body mass index is 22.14 kg/m.  ROS  Review of Systems  HENT: Negative for congestion.   Respiratory: Negative for cough and shortness of breath.   Cardiovascular: Negative for chest pain and leg swelling.  Endocrine: Positive for cold intolerance.  Musculoskeletal: Positive for arthralgias and gait problem.  Allergic/Immunologic: Positive for environmental allergies.  Psychiatric/Behavioral: The patient is nervous/anxious.     Past Medical History:  Diagnosis Date  . Anemia    mild normocytic anemia  . Anxiety   . Bundle branch block   . Coronary artery disease    single vessel of the LAD  . Exertional angina (HCC)   . GERD (gastroesophageal reflux disease)   . Lung nodule    lower  . Normal echocardiogram    normal lv function  . Thrombocytopenia (HCC)    platelet count 80,000    Past Surgical History:  Procedure Laterality Date  . CARDIAC CATHETERIZATION N/A 06/12/2015   Procedure: Left Heart Cath and Cors/Grafts Angiography;  Surgeon: Sherren Mocha, MD;  Location: [facility];  Service: Cardiovascular;  Laterality: N/A;  . COLONOSCOPY N/A 10/07/2016   Procedure: COLONOSCOPY;  Surgeon: Rogene Houston, MD;  [facility];  Service: Endoscopy;  Laterality: N/A;  2:45  . CORONARY ARTERY BYPASS GRAFT  11/04/2008     Ivin Poot, M.D.  . Jilda Roche LASER APPLICATION Right  7/84/6962   Procedure: YAG LASER APPLICATION;  Surgeon: Rutherford Guys, MD;  Location: [facility];  Service: Ophthalmology;  Laterality: Right;    Family History  Problem Relation Age of Onset  . Heart failure Mother   . Heart failure Father   . CAD Sister     Social History Social History  Substance Use Topics  . Smoking status: Never Smoker  . Smokeless tobacco: Never Used  . Alcohol use No    No Known Allergies  Current Outpatient Prescriptions  Medication Sig Dispense Refill  . aspirin 81 MG tablet Take 81 mg by mouth daily.     . Calcium Carbonate-Vit D-Min 600-400 MG-UNIT TABS Take 1 tablet by mouth daily.      . cycloSPORINE (RESTASIS) 0.05 % ophthalmic emulsion Place 1 drop into both eyes 2 times daily.    . hydrochlorothiazide (MICROZIDE) 12.5 MG capsule Take 1 capsule (12.5 mg total) by mouth daily as needed (swelling). 90 capsule 3  . isosorbide mononitrate (IMDUR) 120 MG 24 hr tablet Take 1 tablet (120 mg total) by mouth daily. 90 tablet 3  . metoprolol (LOPRESSOR) 50 MG tablet Take 1 tablet (50 mg total) by mouth 2 (two) times daily. 180 tablet 3  . Multiple Vitamins-Minerals (MULTIVITAMIN WITH MINERALS) tablet Take 1 tablet by mouth daily.      . pantoprazole (PROTONIX) 40 MG tablet Take 1 tablet (40 mg total) by mouth  daily before breakfast. 30 tablet 5  . potassium chloride (K-DUR) 10 MEQ tablet Take 1 tablet (10 mEq total) by mouth daily. 90 tablet 3  . predniSONE (STERAPRED UNI-PAK 21 TAB) 5 MG (21) TBPK tablet Take 6 pills first day; 5 pills second day; 4 pills third day; 3 pills fourth day; 2 pills next day and 1 pill last day. 21 tablet 0  . ranitidine (ZANTAC) 150 MG tablet Take 1 tablet (150 mg total) by mouth at bedtime as needed for heartburn.    . simvastatin (ZOCOR) 40 MG tablet Take 1 tablet (40 mg total) by mouth at bedtime. 90 tablet 3   No current facility-administered medications for this visit.      Physical Exam  Blood pressure (!) 141/74, pulse  81, temperature (!) 97 F (36.1 C), height 5\' 3"  (1.6 m), weight 125 lb (56.7 kg).  Constitutional: overall normal hygiene, normal nutrition, well developed, normal grooming, normal body habitus. Assistive device:none  Musculoskeletal: gait and station Limp none, muscle tone and strength are normal, no tremors or atrophy is present.  .  Neurological: coordination overall normal.  Deep tendon reflex/nerve stretch intact.  Sensation normal.  Cranial nerves II-XII intact.   Skin:   Normal overall no scars, lesions, ulcers or rashes. No psoriasis.  Psychiatric: Alert and oriented x 3.  Recent memory intact, remote memory unclear.  Normal mood and affect. Well groomed.  Good eye contact.  Cardiovascular: overall no swelling, no varicosities, no edema bilaterally, normal temperatures of the legs and arms, no clubbing, cyanosis and good capillary refill.  Lymphatic: palpation is normal.  Her right hip is diffusely tender but has very good motion.  NV intact. She has no paresthesias or limp.  The patient has been educated about the nature of the problem(s) and counseled on treatment options.  The patient appeared to understand what I have discussed and is in agreement with it.  Encounter Diagnoses  Name Primary?  . Pain of right hip joint Yes  . Chronic pain of left knee   . Chronic pain of multiple joints   . Hand pain, left     PLAN Call if any problems.  Precautions discussed.  Continue current medications.   Return to clinic 2 weeks   Begin prednisone dose pack.  Electronically Signed Sanjuana Kava, MD 7/31/201811:32 AM

## 2017-07-19 ENCOUNTER — Encounter (HOSPITAL_COMMUNITY)
Admission: RE | Disposition: A | Discharge status: Home / Self Care | Payer: Self-pay | Source: Ambulatory Visit | Attending: Ophthalmology

## 2017-07-19 ENCOUNTER — Ambulatory Visit (HOSPITAL_COMMUNITY)
Admission: RE | Admit: 2017-07-19 | Discharge: 2017-07-19 | Disposition: A | Discharge status: Home / Self Care | Payer: Medicare Other | Source: Ambulatory Visit | Attending: Ophthalmology | Admitting: Ophthalmology

## 2017-07-19 ENCOUNTER — Ambulatory Visit: Payer: Medicare Other | Admitting: Orthopaedic Surgery

## 2017-07-19 ENCOUNTER — Encounter: Payer: Self-pay | Admitting: Orthopaedic Surgery

## 2017-07-19 DIAGNOSIS — H2640 Unspecified secondary cataract: Secondary | ICD-10-CM | POA: Diagnosis present

## 2017-07-19 DIAGNOSIS — H26493 Other secondary cataract, bilateral: Secondary | ICD-10-CM | POA: Insufficient documentation

## 2017-07-19 HISTORY — PX: YAG LASER APPLICATION: SHX6189

## 2017-07-19 SURGERY — TREATMENT, USING YAG LASER
Anesthesia: LOCAL | Laterality: Left

## 2017-07-19 MED ORDER — CYCLOPENTOLATE-PHENYLEPHRINE 0.2-1 % OP SOLN
OPHTHALMIC | Status: AC
  Filled 2017-07-19: qty 2

## 2017-07-19 MED ORDER — CYCLOPENTOLATE-PHENYLEPHRINE 0.2-1 % OP SOLN
1.0000 [drp] | OPHTHALMIC | Status: AC
  Administered 2017-07-19 (×3): 1 [drp] via OPHTHALMIC

## 2017-07-19 NOTE — Discharge Instructions (Addendum)
[name] Graham  07/19/2017     Instructions    Activity: No Restrictions.   Diet: Resume Diet you were on at home.   Pain Medication: Tylenol if Needed.   CONTACT YOUR DOCTOR IF YOU HAVE PAIN, REDNESS IN YOUR EYE, OR DECREASED VISION.   Follow-up:today with Rutherford Guys, MD.   Dr. Gershon Crane: [phone]  Dr. Iona Hansen: [phone]  Dr. Geoffry Paradise: [phone]   If you find that you cannot contact your physician, but feel that your signs and   Symptoms warrant a physician's attention, call the Emergency Room at   [phone].   Othern/a.  FOLLOW UP WITH DR Gershon Crane TODAY BETWEEN 1-2 PM

## 2017-07-19 NOTE — Op Note (Signed)
Tiffay Pinette [name], MD  Procedure: Yag Capsulotomy  Yag Laser Self Test Completedyes. Procedure: Posterior Capsulotomy, Eye Protection Worn by Staff yes. Laser In Use Sign on Door yes.  Laser: Nd:YAG Spot Size: Fixed Burst Mode: III Power Setting: 3.4 mJ/burst Number of shots: 19 Total energy delivered: 57.45 mJ   The patient tolerated the procedure without difficulty. No complications were encountered.   The patient was discharged home with the instructions to continue all her current glaucoma medications, if any.   Patient instructed to go to office at 0100 for intraocular pressure check.  Patient verbalizes understanding of discharge instructions Yes.  .    Pre-Operative Diagnosis: After-Cataract, obscuring vision, 366.53 OS Post-Operative Diagnosis: After-Cataract, obscuring vision, 366.53 OS Date of Cataract Surgery: 11/24/2011

## 2017-07-19 NOTE — H&P (Signed)
The patient was re examined and there is no change in the patients condition since the original H and P. 

## 2017-07-20 DIAGNOSIS — H26493 Other secondary cataract, bilateral: Secondary | ICD-10-CM | POA: Diagnosis not present

## 2017-07-21 ENCOUNTER — Encounter (HOSPITAL_COMMUNITY): Payer: Self-pay | Admitting: Ophthalmology

## 2017-09-13 ENCOUNTER — Ambulatory Visit (INDEPENDENT_AMBULATORY_CARE_PROVIDER_SITE_OTHER): Payer: Medicare Other | Admitting: Internal Medicine

## 2017-09-19 ENCOUNTER — Telehealth: Payer: Self-pay | Admitting: Cardiovascular Disease

## 2017-09-19 NOTE — Telephone Encounter (Signed)
New message    Pt called wanting to schedule appt with Dr. Burt Knack. She said she has been having SOB and needs to see Dr. Burt Knack. Scheduled December 17th. Told her I would send message to nurse about SOB but she said no, but she sounded SOB on the phone. She said she would call back if it got worse.

## 2017-09-19 NOTE — Telephone Encounter (Signed)
Error

## 2017-09-19 NOTE — Telephone Encounter (Signed)
Called patient to check on her.  She states she "mostly feels fine" but has noticed increased SOB on exertion recently. She is in no acute distress now.  Offered patient PA appointment, but she declined because she feels fine. Rescheduled her to 11/8 with Dr. Burt Knack. She understands to call prior to appointment if symptoms worsen. She was grateful for call and agrees with treatment plan.

## 2017-09-20 ENCOUNTER — Ambulatory Visit (INDEPENDENT_AMBULATORY_CARE_PROVIDER_SITE_OTHER): Payer: Medicare Other | Admitting: Internal Medicine

## 2017-09-20 ENCOUNTER — Encounter (INDEPENDENT_AMBULATORY_CARE_PROVIDER_SITE_OTHER): Payer: Self-pay

## 2017-09-20 ENCOUNTER — Encounter (INDEPENDENT_AMBULATORY_CARE_PROVIDER_SITE_OTHER): Payer: Self-pay | Admitting: Internal Medicine

## 2017-09-20 VITALS — BP 108/70 | HR 63 | Temp 97.8°F | Resp 18 | Ht 64.0 in | Wt 128.7 lb

## 2017-09-20 DIAGNOSIS — K7689 Other specified diseases of liver: Secondary | ICD-10-CM | POA: Diagnosis not present

## 2017-09-20 DIAGNOSIS — K219 Gastro-esophageal reflux disease without esophagitis: Secondary | ICD-10-CM | POA: Diagnosis not present

## 2017-09-20 DIAGNOSIS — R1011 Right upper quadrant pain: Secondary | ICD-10-CM | POA: Diagnosis not present

## 2017-09-20 MED ORDER — PANTOPRAZOLE SODIUM 40 MG PO TBEC: 40.0000 mg | DELAYED_RELEASE_TABLET | Freq: Every day | ORAL | 3 refills | Status: DC

## 2017-09-20 NOTE — Patient Instructions (Signed)
Blood work within 12 hours of abdominal pain.

## 2017-09-20 NOTE — Progress Notes (Signed)
Presenting complaint;  Right upper quadrant abdominal pain. Follow-up for GERD and hepatic cysts.  Subjective:  Patient is 83 year old Caucasian female who is here for scheduled visit. She was last seen in September 2017. She says she is doing well as far as GERD symptoms are concerned. However if she misses PPI dose she has symptoms that evening. She has not taken ranitidine very often. She has regurgitation no more than once a week. She complains of pain below the right costal margin. This pain comes intermittently and may last for couple of days. This pain is not associated with nausea vomiting. She denies shoulder or back pain. She is not sure if  this pain is triggered with meals. She wonders if pain is due to hepatic cysts. Her bowels move daily. She denies melena or rectal bleeding. She takes she had bone density and it did not show osteoporosis. She is still working at school as Oceanographer. She remains active. She walks 3-4 times a week.   Current Medications: Outpatient Encounter Prescriptions as of 09/20/2017  Medication Sig  . aspirin 81 MG tablet Take 81 mg by mouth daily.   . Calcium Carbonate-Vit D-Min 600-400 MG-UNIT TABS Take 1 tablet by mouth daily.    . cycloSPORINE (RESTASIS) 0.05 % ophthalmic emulsion Place 1 drop into both eyes 2 times daily.  Marland Kitchen FLUZONE HIGH-DOSE 0.5 ML injection TO BE ADMINISTERED BY PHARMACIST FOR IMMUNIZATION  . hydrochlorothiazide (MICROZIDE) 12.5 MG capsule Take 1 capsule (12.5 mg total) by mouth daily as needed (swelling).  . isosorbide mononitrate (IMDUR) 120 MG 24 hr tablet Take 1 tablet (120 mg total) by mouth daily.  . metoprolol (LOPRESSOR) 50 MG tablet Take 1 tablet (50 mg total) by mouth 2 (two) times daily.  . Multiple Vitamins-Minerals (MULTIVITAMIN WITH MINERALS) tablet Take 1 tablet by mouth daily.    . pantoprazole (PROTONIX) 40 MG tablet Take 1 tablet (40 mg total) by mouth daily before breakfast.  . potassium chloride (K-DUR)  10 MEQ tablet Take 1 tablet (10 mEq total) by mouth daily.  . ranitidine (ZANTAC) 150 MG tablet Take 1 tablet (150 mg total) by mouth at bedtime as needed for heartburn.  . simvastatin (ZOCOR) 40 MG tablet Take 1 tablet (40 mg total) by mouth at bedtime.  Marland Kitchen Zoster Vaccine Adjuvanted (SHINGRIX) injection TO BE ADMINISTERED BY PHARMACIST FOR IMMUNIZATION  . [DISCONTINUED] predniSONE (STERAPRED UNI-PAK 21 TAB) 5 MG (21) TBPK tablet Take 6 pills first day; 5 pills second day; 4 pills third day; 3 pills fourth day; 2 pills next day and 1 pill last day. (Patient not taking: Reported on 09/20/2017)   No facility-administered encounter medications on file as of 09/20/2017.      Objective: Blood pressure 108/70, pulse 63, temperature 97.8 F (36.6 C), temperature source Oral, resp. rate 18, height 5\' 4"  (1.626 m), weight 128 lb 11.2 oz (58.4 kg). Patient is alert and in no acute distress. Conjunctiva is pink. Sclera is nonicteric Oropharyngeal mucosa is normal. No neck masses or thyromegaly noted. Cardiac exam with regular rhythm normal S1 and S2. She has faint systolic murmur at left sternal border. Lungs are clear to auscultation. Abdomen is symmetrical. On palpation it soft and nontender without organomegaly or masses. No LE edema or clubbing noted. She has atrophy to fingers of right hand with limited flexion.  Labs/studies Results: Abdominal ultrasound on 10/13/2016 was negative for cholelithiasis or dilated bile duct. Multiple hepatic cysts identified. Largest one measured 3.4 cm. Size unchanged compared to ultrasound  of May 2017.   Assessment:  #1. Right upper quadrant abdominal pain. I doubt that this pain is due to hepatic cysts. She could have symptomatic cholelithiasis abdominal wall pain. She will monitor this pain. If she has another episode will check LFTs within 12 hours or so.  #2. GERD. She is requiring daily PPI for symptom control. Bone density reportedly negative for  osteoporosis.  #3. Hepatic cysts. She has had hepatic cysts for the past few years as they have been also noted in prior CTs. I do not believe her pain is secondary to hepatic cysts. No further workup at this time.    Plan:  Continue pantoprazole at current dose of 40 mg daily before breakfast and ranitidine daily at bedtime when necessary. Symptom diary as to frequency and duration of right upper quadrant pain and associated symptoms if she has any. Request copy of recent blood work from PCPs office. LFTs when she has another episode of pain. Office visit in 6 months.

## 2017-10-13 ENCOUNTER — Encounter: Payer: Self-pay | Admitting: Cardiovascular Disease

## 2017-10-13 ENCOUNTER — Ambulatory Visit: Payer: Medicare Other | Admitting: Cardiovascular Disease

## 2017-10-13 VITALS — BP 128/80 | HR 80 | Ht 64.0 in | Wt 128.0 lb

## 2017-10-13 DIAGNOSIS — I2510 Atherosclerotic heart disease of native coronary artery without angina pectoris: Secondary | ICD-10-CM | POA: Diagnosis not present

## 2017-10-13 DIAGNOSIS — I10 Essential (primary) hypertension: Secondary | ICD-10-CM

## 2017-10-13 DIAGNOSIS — E785 Hyperlipidemia, unspecified: Secondary | ICD-10-CM

## 2017-10-13 NOTE — Progress Notes (Signed)
Cardiology Office Note Date:  10/13/2017   ID:  HALEN MOSSBARGER, DOB 04-13-1934, MRN [id number]  PCP:  Glenda Chroman, MD  Cardiologist:  Sherren Mocha, MD    Chief Complaint  Patient presents with  . Follow-up  . Coronary Artery Disease     History of Present Illness: [name] Graham is a 83 y.o. female who presents for follow-up evaluation.   She has a history of coronary artery disease status post CABG in 2009. She underwent a heart catheterization in 2014 to evaluate hemodynamics and coronary anatomy in the setting of anginal symptoms and shortness of breath. Her right heart pressures and hemodynamics were normal. The LAD showed stable diffuse moderate proximal vessel stenosis unchanged from 2009. Her graft anatomy was stable with patency of the vein graft to diagonal and atresia of the LIMA graft.  She is here alone today. Doing well. Reports some shortness of breath with exertion. No edema, orthopnea, or PND. No palpitations.  Has occasional wheezing.  Overall she feels well and reports no change in her symptoms.  She does have some chest discomfort if she misses her long-acting nitrate or beta-blocker but this is unchanged from previous.   Past Medical History:  Diagnosis Date  . Anemia    mild normocytic anemia  . Anxiety   . Bundle branch block   . Coronary artery disease    single vessel of the LAD  . Exertional angina (HCC)   . GERD (gastroesophageal reflux disease)   . Lung nodule    lower  . Normal echocardiogram    normal lv function  . Thrombocytopenia (HCC)    platelet count 80,000    Past Surgical History:  Procedure Laterality Date  . CORONARY ARTERY BYPASS GRAFT  11/04/2008     Ivin Poot, M.D.    Current Outpatient Medications  Medication Sig Dispense Refill  . aspirin 81 MG tablet Take 81 mg by mouth daily.     . Calcium Carbonate-Vit D-Min 600-400 MG-UNIT TABS Take 1 tablet by mouth daily.      . hydrochlorothiazide (MICROZIDE) 12.5  MG capsule Take 1 capsule (12.5 mg total) by mouth daily as needed (swelling). 90 capsule 3  . isosorbide mononitrate (IMDUR) 120 MG 24 hr tablet Take 1 tablet (120 mg total) by mouth daily. 90 tablet 3  . metoprolol (LOPRESSOR) 50 MG tablet Take 1 tablet (50 mg total) by mouth 2 (two) times daily. 180 tablet 3  . Multiple Vitamins-Minerals (MULTIVITAMIN WITH MINERALS) tablet Take 1 tablet by mouth daily.      . pantoprazole (PROTONIX) 40 MG tablet Take 1 tablet (40 mg total) by mouth daily before breakfast. 90 tablet 3  . potassium chloride (K-DUR) 10 MEQ tablet Take 1 tablet (10 mEq total) by mouth daily. 90 tablet 3  . ranitidine (ZANTAC) 150 MG tablet Take 1 tablet (150 mg total) by mouth at bedtime as needed for heartburn.    . simvastatin (ZOCOR) 40 MG tablet Take 1 tablet (40 mg total) by mouth at bedtime. 90 tablet 3  . Zoster Vaccine Adjuvanted (SHINGRIX) injection TO BE ADMINISTERED BY PHARMACIST FOR IMMUNIZATION  0   No current facility-administered medications for this visit.     Allergies:   Patient has no known allergies.   Social History:  The patient  reports that  has never smoked. she has never used smokeless tobacco. She reports that she does not drink alcohol or use drugs.   Family History:  The patient's  family history includes CAD in her sister; Heart failure in her father and mother.   ROS:  Please see the history of present illness.  Otherwise, review of systems is positive for leg pain, wheezing.  All other systems are reviewed and negative.   PHYSICAL EXAM: VS:  BP 128/80   Pulse 80   Ht 5\' 4"  (1.626 m)   Wt 128 lb (58.1 kg)   BMI 21.97 kg/m  , BMI Body mass index is 21.97 kg/m. GEN: Well nourished, well developed, pleasant elderly woman in no acute distress  HEENT: normal  Neck: no JVD, no masses. No carotid bruits Cardiac: RRR without murmur or gallop                Respiratory:  clear to auscultation bilaterally, normal work of breathing GI: soft,  nontender, nondistended, + BS MS: no deformity or atrophy  Ext: no pretibial edema, pedal pulses 2+= bilaterally Skin: warm and dry, no rash Neuro:  Strength and sensation are intact Psych: euthymic mood, full affect  EKG:  EKG is ordered today. The ekg ordered today shows NSR 80 bpm with sinus arrhythmia, LBBB unchanged from previous tracings  Recent Labs: 03/25/2017: ALT 18; BUN 15; Creatinine, Ser 0.96; Potassium 4.0; Sodium 139   Lipid Panel     Component Value Date/Time   CHOL 186 03/25/2017 1030   TRIG 194 (H) 03/25/2017 1030   HDL 39 (L) 03/25/2017 1030   CHOLHDL 4.8 (H) 03/25/2017 1030   CHOLHDL 6 12/19/2014 0941   VLDL 53.8 (H) 12/19/2014 0941   LDLCALC 108 (H) 03/25/2017 1030   LDLDIRECT 109.0 12/19/2014 0941      Wt Readings from Last 3 Encounters:  10/13/17 128 lb (58.1 kg)  09/20/17 128 lb 11.2 oz (58.4 kg)  07/05/17 125 lb (56.7 kg)     Cardiac Studies Reviewed: Cardiac Cath 06-12-2015: Conclusion   1. Prox RCA lesion, 30% stenosed. 2. Prox LAD lesion, 50% stenosed. 3. was injected is small. 4. There is mild disease in the graft. 5. There is mild diffuse vein graft irregularity unchanged from the previous study 6. The left ventricular systolic function is normal.   FINAL CONCLUSIONS:  MILD-MODERATE DIFFUSE PROXIMAL AND MID-LAD STENOSIS  WIDELY PATENT LCX  PATENT RCA, INITIALLY WITH PROXIMAL VASOSPASM RESOLVED WITH IC NTG  VIGOROUS/NORMAL LV FUNCTION  STABLE/IMPROVED FINDINGS FROM PRIOR CATH STUDIES  RECOMMEND CONTINUED MEDICAL MANAGEMENT    ASSESSMENT AND PLAN: 1.  Coronary artery disease, native vessel, with angina: Patient continues to do well on isosorbide and metoprolol.  No changes are recommended in her medical regimen today.  2.  Hypertension: Blood pressure remains well controlled on her current medical therapy.  She is on multidrug antihypertensive therapy with hydrochlorothiazide, isosorbide.  3.  Hyperlipidemia: She continues on  simvastatin.  Most recent lipids are reviewed from April 2018 as above.  Will update repeat lipids at the time of her next office visit in 6 months.  Current medicines are reviewed with the patient today.  The patient does not have concerns regarding medicines.  Labs/ tests ordered today include:   Orders Placed This Encounter  Procedures  . Lipid panel  . Hepatic function panel    Disposition:   FU 6 months with a lipid/liver   Signed, Sherren Mocha, MD  10/13/2017 12:56 PM    [address], [place], [place]  [address] Phone: [phone]; Fax: [phone]

## 2017-10-13 NOTE — Patient Instructions (Signed)
Medication Instructions:  Your provider recommends that you continue on your current medications as directed. Please refer to the Current Medication list given to you today.    Labwork: Your provider recommends that you return for fasting lab work 6 months.  Testing/Procedures: None  Follow-Up: Your provider wants you to follow-up in: 6 months with Dr. Burt Knack. You will receive a reminder letter in the mail two months in advance. If you don't receive a letter, please call our office to schedule the follow-up appointment.    Any Other Special Instructions Will Be Listed Below (If Applicable).     If you need a refill on your cardiac medications before your next appointment, please call your pharmacy.

## 2017-10-17 ENCOUNTER — Other Ambulatory Visit: Payer: Self-pay

## 2017-10-17 DIAGNOSIS — I10 Essential (primary) hypertension: Secondary | ICD-10-CM

## 2017-11-21 ENCOUNTER — Ambulatory Visit: Payer: Medicare Other | Admitting: Cardiovascular Disease

## 2018-01-17 ENCOUNTER — Ambulatory Visit (INDEPENDENT_AMBULATORY_CARE_PROVIDER_SITE_OTHER): Payer: Medicare Other | Admitting: Internal Medicine

## 2018-03-21 ENCOUNTER — Ambulatory Visit (INDEPENDENT_AMBULATORY_CARE_PROVIDER_SITE_OTHER): Payer: Medicare Other | Admitting: Internal Medicine

## 2018-05-15 ENCOUNTER — Encounter: Payer: Self-pay | Admitting: Cardiovascular Disease

## 2018-05-15 ENCOUNTER — Encounter

## 2018-05-15 ENCOUNTER — Ambulatory Visit (INDEPENDENT_AMBULATORY_CARE_PROVIDER_SITE_OTHER): Payer: Medicare Other | Admitting: Cardiovascular Disease

## 2018-05-15 VITALS — BP 138/88 | HR 92 | Ht 64.0 in | Wt 128.2 lb

## 2018-05-15 DIAGNOSIS — I25119 Atherosclerotic heart disease of native coronary artery with unspecified angina pectoris: Secondary | ICD-10-CM

## 2018-05-15 DIAGNOSIS — E782 Mixed hyperlipidemia: Secondary | ICD-10-CM | POA: Diagnosis not present

## 2018-05-15 MED ORDER — POTASSIUM CHLORIDE ER 10 MEQ PO TBCR
10.0000 meq | EXTENDED_RELEASE_TABLET | Freq: Every day | ORAL | 3 refills | Status: DC
Start: 2018-05-15 — End: 2019-06-16

## 2018-05-15 MED ORDER — HYDROCHLOROTHIAZIDE 12.5 MG PO CAPS: 12.5000 mg | ORAL_CAPSULE | Freq: Every day | ORAL | 3 refills | Status: DC | PRN

## 2018-05-15 MED ORDER — ISOSORBIDE MONONITRATE ER 120 MG PO TB24: 120.0000 mg | ORAL_TABLET | Freq: Every day | ORAL | 3 refills | Status: DC

## 2018-05-15 MED ORDER — METOPROLOL TARTRATE 50 MG PO TABS: 50.0000 mg | ORAL_TABLET | Freq: Two times a day (BID) | ORAL | 3 refills | Status: DC

## 2018-05-15 MED ORDER — SIMVASTATIN 40 MG PO TABS: 40.0000 mg | ORAL_TABLET | Freq: Every day | ORAL | 3 refills | Status: DC

## 2018-05-15 MED ORDER — PANTOPRAZOLE SODIUM 40 MG PO TBEC: 40.0000 mg | DELAYED_RELEASE_TABLET | Freq: Every day | ORAL | 3 refills | Status: DC

## 2018-05-15 NOTE — Progress Notes (Signed)
Cardiology Office Note Date:  05/15/2018   ID:  [name] Graham, DOB 1933-12-22, MRN [id number]  PCP:  Glenda Chroman, MD  Cardiologist:  Sherren Mocha, MD    Chief Complaint  Patient presents with  . Coronary Artery Disease     History of Present Illness: [name] Graham is a 84 y.o. female who presents for follow-up of coronary artery disease.  The patient underwent CABG in 2009.  She has a history of chronic angina with most recent heart catheterization in 2014 demonstrating stable coronary anatomy with patency of the saphenous vein graft to diagonal and atresia of the LIMA to LAD graft.  There is moderate diffuse proximal LAD stenosis.  She's doing well. Here alone today. 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 No significant angina on her chronic medical therapy - states she takes NTG on a rare basis.  She denies shortness of breath, edema, or heart palpitations.  She is walking on a regular basis in a park near her home and she has no exertional symptoms.  She is compliant with her medications.  Tells me that her blood sugar has been increased lately.   Past Medical History:  Diagnosis Date  . Anemia    mild normocytic anemia  . Anxiety   . Bundle branch block   . Coronary artery disease    single vessel of the LAD  . Exertional angina (HCC)   . GERD (gastroesophageal reflux disease)   . Lung nodule    lower  . Normal echocardiogram    normal lv function  . Thrombocytopenia (HCC)    platelet count 80,000    Past Surgical History:  Procedure Laterality Date  . CARDIAC CATHETERIZATION N/A 06/12/2015   Procedure: Left Heart Cath and Cors/Grafts Angiography;  Surgeon: Sherren Mocha, MD;  Location: [facility];  Service: Cardiovascular;  Laterality: N/A;  . COLONOSCOPY N/A 10/07/2016   Procedure: COLONOSCOPY;  Surgeon: Rogene Houston, MD;  Location: [facility];  Service: Endoscopy;  Laterality: N/A;  2:45  . CORONARY  ARTERY BYPASS GRAFT  11/04/2008     Ivin Poot, M.D.  . Jilda Roche LASER APPLICATION Right 2/99/3716   Procedure: YAG LASER APPLICATION;  Surgeon: Rutherford Guys, MD;  Location: [facility];  Service: Ophthalmology;  Laterality: Right;  . YAG LASER APPLICATION Left 9/67/8938   Procedure: YAG LASER APPLICATION;  Surgeon: Rutherford Guys, MD;  Location: [facility];  Service: Ophthalmology;  Laterality: Left;    Current Outpatient Medications  Medication Sig Dispense Refill  . aspirin 81 MG tablet Take 81 mg by mouth daily.     . Calcium Carbonate-Vit D-Min 600-400 MG-UNIT TABS Take 1 tablet by mouth daily.      . hydrochlorothiazide (MICROZIDE) 12.5 MG capsule Take 1 capsule (12.5 mg total) by mouth daily as needed (swelling). 90 capsule 3  . isosorbide mononitrate (IMDUR) 120 MG 24 hr tablet Take 1 tablet (120 mg total) by mouth daily. 90 tablet 3  . metoprolol (LOPRESSOR) 50 MG tablet Take 1 tablet (50 mg total) by mouth 2 (two) times daily. 180 tablet 3  . Multiple Vitamins-Minerals (MULTIVITAMIN WITH MINERALS) tablet Take 1 tablet by mouth daily.      . pantoprazole (PROTONIX) 40 MG tablet Take 1 tablet (40 mg total) by mouth daily before breakfast. 90 tablet 3  . potassium chloride (K-DUR) 10 MEQ tablet Take 1 tablet (10 mEq total) by mouth daily. 90 tablet 3  . ranitidine (  ZANTAC) 150 MG tablet Take 1 tablet (150 mg total) by mouth at bedtime as needed for heartburn.    . simvastatin (ZOCOR) 40 MG tablet Take 1 tablet (40 mg total) by mouth at bedtime. 90 tablet 3  . Zoster Vaccine Adjuvanted (SHINGRIX) injection TO BE ADMINISTERED BY PHARMACIST FOR IMMUNIZATION  0   No current facility-administered medications for this visit.     Allergies:   Patient has no known allergies.   Social History:  The patient  reports that she has never smoked. She has never used smokeless tobacco. She reports that she does not drink alcohol or use drugs.   Family History:  The patient's family history includes CAD in  her sister; Heart failure in her father and mother.    ROS:  Please see the history of present illness.   All other systems are reviewed and negative.    PHYSICAL EXAM: VS:  BP 138/88   Pulse 92   Ht 5\' 4"  (1.626 m)   Wt 128 lb 3.2 oz (58.2 kg)   SpO2 99%   BMI 22.01 kg/m  , BMI Body mass index is 22.01 kg/m. GEN: Well nourished, well developed, in no acute distress  HEENT: normal  Neck: no JVD, no masses. No carotid bruits Cardiac: RRR without murmur or gallop  - widely split S2              Respiratory:  clear to auscultation bilaterally, normal work of breathing GI: soft, nontender, nondistended, + BS MS: no deformity or atrophy  Ext: no pretibial edema, pedal pulses 2+= bilaterally Skin: warm and dry, no rash Neuro:  Strength and sensation are intact Psych: euthymic mood, full affect  EKG:  EKG is not ordered today.  Recent Labs: No results found for requested labs within last 8760 hours.   Lipid Panel     Component Value Date/Time   CHOL 186 03/25/2017 1030   TRIG 194 (H) 03/25/2017 1030   HDL 39 (L) 03/25/2017 1030   CHOLHDL 4.8 (H) 03/25/2017 1030   CHOLHDL 6 12/19/2014 0941   VLDL 53.8 (H) 12/19/2014 0941   LDLCALC 108 (H) 03/25/2017 1030   LDLDIRECT 109.0 12/19/2014 0941      Wt Readings from Last 3 Encounters:  05/15/18 128 lb 3.2 oz (58.2 kg)  10/13/17 128 lb (58.1 kg)  09/20/17 128 lb 11.2 oz (58.4 kg)    ASSESSMENT AND PLAN: 1.  Coronary artery disease, native vessel, with angina: The patient is stable on her current medical program which is reviewed today.  She is on antianginal medications with isosorbide and metoprolol.  She continues on aspirin and a statin drug.  She will follow-up in 6 months.  2.  Mixed hyperlipidemia: Treated with simvastatin.  Last year his lipids reviewed.  Update lipid panel today as she is fasting.  3.  Hypertension: Blood pressure well controlled  4.  Elevated glucose by history: We will check hemoglobin A1c.  Diet  and lifestyle modification discussed.  Current medicines are reviewed with the patient today.  The patient does not have concerns regarding medicines.  Labs/ tests ordered today include:  No orders of the defined types were placed in this encounter.   Disposition:   FU 6 months  Signed, Sherren Mocha, MD  05/15/2018 8:21 AM    [facility] [place], [place], [place]  [address] Phone: [phone]; Fax: [phone]

## 2018-05-15 NOTE — Patient Instructions (Signed)
Medication Instructions:  Your provider recommends that you continue on your current medications as directed. Please refer to the Current Medication list given to you today.     Labwork: TODAY: CBC, CMET, Lipids, A1C  Testing/Procedures: None  Follow-Up: Your provider wants you to follow-up in: 6 months with Dr. Burt Knack. You will receive a reminder letter in the mail two months in advance. If you don't receive a letter, please call our office to schedule the follow-up appointment.    Any Other Special Instructions Will Be Listed Below (If Applicable).     If you need a refill on your cardiac medications before your next appointment, please call your pharmacy.

## 2018-05-16 LAB — COMPREHENSIVE METABOLIC PANEL
ALK PHOS: 62 IU/L (ref 39–117)
ALT: 16 IU/L (ref 0–32)
AST: 13 IU/L (ref 0–40)
Albumin/Globulin Ratio: 2.3 — ABNORMAL HIGH (ref 1.2–2.2)
Albumin: 4.6 g/dL (ref 3.5–4.7)
BUN/Creatinine Ratio: 19 (ref 12–28)
BUN: 19 mg/dL (ref 8–27)
Bilirubin Total: 0.5 mg/dL (ref 0.0–1.2)
CALCIUM: 9.6 mg/dL (ref 8.7–10.3)
CO2: 21 mmol/L (ref 20–29)
CREATININE: 1.01 mg/dL — AB (ref 0.57–1.00)
Chloride: 105 mmol/L (ref 96–106)
GFR calc Af Amer: 60 mL/min/{1.73_m2} (ref >59)
GFR calc non Af Amer: 52 mL/min/{1.73_m2} — ABNORMAL LOW (ref >59)
GLOBULIN, TOTAL: 2 g/dL (ref 1.5–4.5)
GLUCOSE: 128 mg/dL — AB (ref 65–99)
Potassium: 4.4 mmol/L (ref 3.5–5.2)
Sodium: 139 mmol/L (ref 134–144)
Total Protein: 6.6 g/dL (ref 6.0–8.5)

## 2018-05-16 LAB — CBC WITH DIFFERENTIAL/PLATELET
Basophils Absolute: 0 10*3/uL (ref 0.0–0.2)
Basos: 0 %
EOS (ABSOLUTE): 0.2 10*3/uL (ref 0.0–0.4)
Eos: 3 %
Hematocrit: 39.6 % (ref 34.0–46.6)
Hemoglobin: 13.9 g/dL (ref 11.1–15.9)
Immature Grans (Abs): 0 10*3/uL (ref 0.0–0.1)
Immature Granulocytes: 0 %
Lymphocytes Absolute: 1.6 10*3/uL (ref 0.7–3.1)
Lymphs: 23 %
MCH: 31 pg (ref 26.6–33.0)
MCHC: 35.1 g/dL (ref 31.5–35.7)
MCV: 88 fL (ref 79–97)
Monocytes Absolute: 0.5 10*3/uL (ref 0.1–0.9)
Monocytes: 7 %
Neutrophils Absolute: 4.6 10*3/uL (ref 1.4–7.0)
Neutrophils: 67 %
Platelets: 159 10*3/uL (ref 150–450)
RBC: 4.48 x10E6/uL (ref 3.77–5.28)
RDW: 13.4 % (ref 12.3–15.4)
WBC: 7 10*3/uL (ref 3.4–10.8)

## 2018-05-16 LAB — HEMOGLOBIN A1C
Est. average glucose Bld gHb Est-mCnc: 134 mg/dL
Hgb A1c MFr Bld: 6.3 % — ABNORMAL HIGH (ref 4.8–5.6)

## 2018-06-12 ENCOUNTER — Other Ambulatory Visit (HOSPITAL_COMMUNITY): Payer: Self-pay | Admitting: Internal Medicine

## 2018-06-12 DIAGNOSIS — Z1231 Encounter for screening mammogram for malignant neoplasm of breast: Secondary | ICD-10-CM

## 2018-06-21 ENCOUNTER — Ambulatory Visit (HOSPITAL_COMMUNITY): Payer: Medicare Other

## 2018-06-22 ENCOUNTER — Other Ambulatory Visit (HOSPITAL_COMMUNITY): Payer: Self-pay | Admitting: Internal Medicine

## 2018-06-22 ENCOUNTER — Ambulatory Visit (HOSPITAL_COMMUNITY)
Admission: RE | Admit: 2018-06-22 | Discharge: 2018-06-22 | Disposition: A | Discharge status: Home / Self Care | Payer: Medicare Other | Source: Ambulatory Visit | Attending: Internal Medicine | Admitting: Internal Medicine

## 2018-06-22 DIAGNOSIS — Z1231 Encounter for screening mammogram for malignant neoplasm of breast: Secondary | ICD-10-CM | POA: Diagnosis present
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
## 2018-12-27 ENCOUNTER — Encounter: Payer: Self-pay | Admitting: Cardiovascular Disease

## 2018-12-27 ENCOUNTER — Ambulatory Visit (INDEPENDENT_AMBULATORY_CARE_PROVIDER_SITE_OTHER): Payer: Medicare Other | Admitting: Cardiovascular Disease

## 2018-12-27 VITALS — BP 116/64 | HR 94 | Ht 64.0 in | Wt 129.4 lb

## 2018-12-27 DIAGNOSIS — I25119 Atherosclerotic heart disease of native coronary artery with unspecified angina pectoris: Secondary | ICD-10-CM

## 2018-12-27 DIAGNOSIS — E782 Mixed hyperlipidemia: Secondary | ICD-10-CM

## 2018-12-27 DIAGNOSIS — I10 Essential (primary) hypertension: Secondary | ICD-10-CM

## 2018-12-27 DIAGNOSIS — R0602 Shortness of breath: Secondary | ICD-10-CM

## 2018-12-27 MED ORDER — PANTOPRAZOLE SODIUM 40 MG PO TBEC: 40.0000 mg | DELAYED_RELEASE_TABLET | Freq: Every day | ORAL | 3 refills | Status: DC

## 2018-12-27 MED ORDER — ISOSORBIDE MONONITRATE ER 120 MG PO TB24: 120.0000 mg | ORAL_TABLET | Freq: Every day | ORAL | 3 refills | Status: DC

## 2018-12-27 NOTE — Progress Notes (Signed)
Cardiology Office Note:    Date:  12/27/2018   ID:  [name] Graham, DOB Sep 05, 1934, MRN [id number]  PCP:  Glenda Chroman, MD  Cardiologist:  Sherren Mocha, MD  Electrophysiologist:  None   Referring MD: Glenda Chroman, MD   Chief Complaint  Patient presents with  . Shortness of Breath    History of Present Illness:    [name] Graham is a 85 y.o. female with a hx of coronary artery disease and CABG in 2009.  Her last heart catheterization in 2014 demonstrated normal right heart pressures with moderate stenosis of the proximal LAD unchanged from her previous heart catheterization.  Graft anatomy was also stable with patency of the saphenous vein graft to diagonal and demonstration of an atretic LIMA to LAD graft.  The patient is here alone today.  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  She has noticed some shortness of breath with exertion.  States that she experiences this more often than she did at the time of her last visit.  She also has occasional chest heaviness but notes more prominent symptoms of shortness of breath.  She denies orthopnea, PND, or leg swelling.  She has no heart palpitations.  Past Medical History:  Diagnosis Date  . Anemia    mild normocytic anemia  . Anxiety   . Bundle branch block   . Coronary artery disease    single vessel of the LAD  . Exertional angina (HCC)   . GERD (gastroesophageal reflux disease)   . Lung nodule    lower  . Normal echocardiogram    normal lv function  . Thrombocytopenia (HCC)    platelet count 80,000    Past Surgical History:  Procedure Laterality Date  . CARDIAC CATHETERIZATION N/A 06/12/2015   Procedure: Left Heart Cath and Cors/Grafts Angiography;  Surgeon: Sherren Mocha, MD;  Location: [facility];  Service: Cardiovascular;  Laterality: N/A;  . COLONOSCOPY N/A 10/07/2016   Procedure: COLONOSCOPY;  Surgeon: Rogene Houston, MD;  [facility];   Service: Endoscopy;  Laterality: N/A;  2:45  . CORONARY ARTERY BYPASS GRAFT  11/04/2008     Ivin Poot, M.D.  . Jilda Roche LASER APPLICATION Right 06/13/6282   Procedure: YAG LASER APPLICATION;  Surgeon: Rutherford Guys, MD;  Location: [facility];  Service: Ophthalmology;  Laterality: Right;  . YAG LASER APPLICATION Left 6/62/9476   Procedure: YAG LASER APPLICATION;  Surgeon: Rutherford Guys, MD;  Location: [facility];  Service: Ophthalmology;  Laterality: Left;    Current Medications: Current Meds  Medication Sig  . aspirin 81 MG tablet Take 81 mg by mouth daily.   . Calcium Carbonate-Vit D-Min 600-400 MG-UNIT TABS Take 1 tablet by mouth daily.    . hydrochlorothiazide (MICROZIDE) 12.5 MG capsule Take 1 capsule (12.5 mg total) by mouth daily as needed (swelling).  . isosorbide mononitrate (IMDUR) 120 MG 24 hr tablet Take 1 tablet (120 mg total) by mouth daily.  . metoprolol tartrate (LOPRESSOR) 50 MG tablet Take 1 tablet (50 mg total) by mouth 2 (two) times daily.  . Multiple Vitamins-Minerals (MULTIVITAMIN WITH MINERALS) tablet Take 1 tablet by mouth daily.    . pantoprazole (PROTONIX) 40 MG tablet Take 1 tablet (40 mg total) by mouth daily before breakfast.  . potassium chloride (K-DUR) 10 MEQ tablet Take 1 tablet (10 mEq total) by mouth daily.  . ranitidine (ZANTAC) 150 MG tablet Take 1 tablet (150 mg total) by  mouth at bedtime as needed for heartburn.  . simvastatin (ZOCOR) 40 MG tablet Take 1 tablet (40 mg total) by mouth at bedtime.  Marland Kitchen Zoster Vaccine Adjuvanted (SHINGRIX) injection TO BE ADMINISTERED BY PHARMACIST FOR IMMUNIZATION  . [DISCONTINUED] isosorbide mononitrate (IMDUR) 120 MG 24 hr tablet Take 1 tablet (120 mg total) by mouth daily.  . [DISCONTINUED] pantoprazole (PROTONIX) 40 MG tablet Take 1 tablet (40 mg total) by mouth daily before breakfast.     Allergies:   Patient has no known allergies.   Social History   Socioeconomic History  . Marital status: Widowed    Spouse name: Not  on file  . Number of children: Not on file  . Years of education: Not on file  . Highest education level: Not on file  Occupational History  . Occupation: RETIRED  Social Needs  . Financial resource strain: Not on file  . Food insecurity:    Worry: Not on file    Inability: Not on file  . Transportation needs:    Medical: Not on file    Non-medical: Not on file  Tobacco Use  . Smoking status: Never Smoker  . Smokeless tobacco: Never Used  Substance and Sexual Activity  . Alcohol use: No  . Drug use: No  . Sexual activity: Not on file  Lifestyle  . Physical activity:    Days per week: Not on file    Minutes per session: Not on file  . Stress: Not on file  Relationships  . Social connections:    Talks on phone: Not on file    Gets together: Not on file    Attends religious service: Not on file    Active member of club or organization: Not on file    Attends meetings of clubs or organizations: Not on file    Relationship status: Not on file  Other Topics Concern  . Not on file  Social History Narrative  . Not on file     Family History: The patient's family history includes CAD in her sister; Heart failure in her father and mother.  ROS:   Please see the history of present illness.    Exertional dyspnea, easy bruising, wheezing.  All other systems reviewed and are negative.  EKGs/Labs/Other Studies Reviewed:    EKG:  EKG is ordered today.  The ekg ordered today demonstrates normal sinus rhythm 94 bpm, left bundle branch block.  No change from previous tracing.  Recent Labs: 05/15/2018: ALT 16; BUN 19; Creatinine, Ser 1.01; Hemoglobin 13.9; Platelets 159; Potassium 4.4; Sodium 139  Recent Lipid Panel    Component Value Date/Time   CHOL 186 03/25/2017 1030   TRIG 194 (H) 03/25/2017 1030   HDL 39 (L) 03/25/2017 1030   CHOLHDL 4.8 (H) 03/25/2017 1030   CHOLHDL 6 12/19/2014 0941   VLDL 53.8 (H) 12/19/2014 0941   LDLCALC 108 (H) 03/25/2017 1030   LDLDIRECT 109.0  12/19/2014 0941    Physical Exam:    VS:  BP 116/64   Pulse 94   Ht 5\' 4"  (1.626 m)   Wt 129 lb 6.4 oz (58.7 kg)   SpO2 98%   BMI 22.21 kg/m     Wt Readings from Last 3 Encounters:  12/27/18 129 lb 6.4 oz (58.7 kg)  05/15/18 128 lb 3.2 oz (58.2 kg)  10/13/17 128 lb (58.1 kg)    GEN: Pleasant elderly woman in no acute distress HEENT: Normal NECK: No JVD; No carotid bruits LYMPHATICS: No lymphadenopathy CARDIAC:  RRR, no murmurs, rubs, gallops RESPIRATORY:  Clear to auscultation without rales, wheezing or rhonchi  ABDOMEN: Soft, non-tender, non-distended MUSCULOSKELETAL:  No edema; No deformity  SKIN: Warm and dry NEUROLOGIC:  Alert and oriented x 3 PSYCHIATRIC:  Normal affect   ASSESSMENT:    1. Shortness of breath   2. Coronary artery disease involving native coronary artery of native heart with angina pectoris ([place])   3. Mixed hyperlipidemia   4. Essential hypertension, benign    PLAN:    In order of problems listed above:  1. I have recommended an echocardiogram for further assessment.  I do not appreciate any clear signs of congestive heart failure or volume overload.  Her lung fields are clear on exam. 2. The patient has stable symptoms.  She continues on a medical program of aspirin for antiplatelet therapy, isosorbide, and metoprolol. 3. Treated with simvastatin.  Last lipids reviewed. 4. Blood pressure is well controlled on hydrochlorothiazide, isosorbide, and metoprolol   Medication Adjustments/Labs and Tests Ordered: Current medicines are reviewed at length with the patient today.  Concerns regarding medicines are outlined above.  Orders Placed This Encounter  Procedures  . EKG 12-Lead  . ECHOCARDIOGRAM COMPLETE   Meds ordered this encounter  Medications  . isosorbide mononitrate (IMDUR) 120 MG 24 hr tablet    Sig: Take 1 tablet (120 mg total) by mouth daily.    Dispense:  90 tablet    Refill:  3  . pantoprazole (PROTONIX) 40 MG tablet    Sig: Take  1 tablet (40 mg total) by mouth daily before breakfast.    Dispense:  90 tablet    Refill:  3    Patient Instructions  Medication Instructions:  Your provider recommends that you continue on your current medications as directed. Please refer to the Current Medication list given to you today.    Labwork: None  Testing/Procedures: Your provider has requested that you have an echocardiogram. Echocardiography is a painless test that uses sound waves to create images of your heart. It provides your doctor with information about the size and shape of your heart and how well your heart's chambers and valves are working. This procedure takes approximately one hour. There are no restrictions for this procedure.  Follow-Up: Your provider wants you to follow-up in: 6 months with Dr. Burt Knack or his assistant. You will receive a reminder letter in the mail two months in advance. If you don't receive a letter, please call our office to schedule the follow-up appointment.       Signed, Sherren Mocha, MD  12/27/2018 5:29 PM    [facility]

## 2018-12-27 NOTE — Patient Instructions (Signed)
Medication Instructions:  Your provider recommends that you continue on your current medications as directed. Please refer to the Current Medication list given to you today.    Labwork: None  Testing/Procedures: Your provider has requested that you have an echocardiogram. Echocardiography is a painless test that uses sound waves to create images of your heart. It provides your doctor with information about the size and shape of your heart and how well your heart's chambers and valves are working. This procedure takes approximately one hour. There are no restrictions for this procedure.  Follow-Up: Your provider wants you to follow-up in: 6 months with Dr. Burt Knack or his assistant. You will receive a reminder letter in the mail two months in advance. If you don't receive a letter, please call our office to schedule the follow-up appointment.

## 2019-01-02 ENCOUNTER — Other Ambulatory Visit (HOSPITAL_COMMUNITY): Payer: Medicare Other

## 2019-01-25 ENCOUNTER — Ambulatory Visit (INDEPENDENT_AMBULATORY_CARE_PROVIDER_SITE_OTHER): Payer: Medicare Other | Admitting: Otolaryngology

## 2019-04-23 ENCOUNTER — Encounter

## 2019-06-07 ENCOUNTER — Ambulatory Visit (INDEPENDENT_AMBULATORY_CARE_PROVIDER_SITE_OTHER): Payer: Medicare Other | Admitting: Otolaryngology

## 2019-06-07 DIAGNOSIS — H9011 Conductive hearing loss, unilateral, right ear, with unrestricted hearing on the contralateral side: Secondary | ICD-10-CM | POA: Diagnosis not present

## 2019-06-07 DIAGNOSIS — H6123 Impacted cerumen, bilateral: Secondary | ICD-10-CM | POA: Diagnosis not present

## 2019-06-07 DIAGNOSIS — H903 Sensorineural hearing loss, bilateral: Secondary | ICD-10-CM | POA: Diagnosis not present

## 2019-06-16 ENCOUNTER — Emergency Department (HOSPITAL_COMMUNITY): Payer: Medicare Other

## 2019-06-16 ENCOUNTER — Emergency Department (HOSPITAL_COMMUNITY)
Admission: EM | Admit: 2019-06-16 | Discharge: 2019-06-16 | Disposition: A | Discharge status: Home / Self Care | Payer: Medicare Other | Attending: Emergency Medicine | Admitting: Emergency Medicine

## 2019-06-16 DIAGNOSIS — M542 Cervicalgia: Secondary | ICD-10-CM | POA: Diagnosis not present

## 2019-06-16 DIAGNOSIS — I2510 Atherosclerotic heart disease of native coronary artery without angina pectoris: Secondary | ICD-10-CM | POA: Diagnosis not present

## 2019-06-16 DIAGNOSIS — R51 Headache: Secondary | ICD-10-CM | POA: Diagnosis present

## 2019-06-16 DIAGNOSIS — G44209 Tension-type headache, unspecified, not intractable: Secondary | ICD-10-CM | POA: Diagnosis not present

## 2019-06-16 DIAGNOSIS — R519 Headache, unspecified: Secondary | ICD-10-CM

## 2019-06-16 DIAGNOSIS — Z7984 Long term (current) use of oral hypoglycemic drugs: Secondary | ICD-10-CM | POA: Diagnosis not present

## 2019-06-16 DIAGNOSIS — Z79899 Other long term (current) drug therapy: Secondary | ICD-10-CM | POA: Diagnosis not present

## 2019-06-16 DIAGNOSIS — Z7982 Long term (current) use of aspirin: Secondary | ICD-10-CM | POA: Insufficient documentation

## 2019-06-16 LAB — BASIC METABOLIC PANEL
Anion gap: 8 (ref 5–15)
BUN: 13 mg/dL (ref 8–23)
CO2: 21 mmol/L — ABNORMAL LOW (ref 22–32)
Calcium: 9.1 mg/dL (ref 8.9–10.3)
Chloride: 107 mmol/L (ref 98–111)
Creatinine, Ser: 0.84 mg/dL (ref 0.44–1.00)
GFR calc Af Amer: >60 mL/min (ref >60)
GFR calc non Af Amer: >60 mL/min (ref >60)
Glucose, Bld: 124 mg/dL — ABNORMAL HIGH (ref 70–99)
Potassium: 4.1 mmol/L (ref 3.5–5.1)
Sodium: 136 mmol/L (ref 135–145)

## 2019-06-16 LAB — CBC
HCT: 34.7 % — ABNORMAL LOW (ref 36.0–46.0)
Hemoglobin: 12.1 g/dL (ref 12.0–15.0)
MCH: 30.7 pg (ref 26.0–34.0)
MCHC: 34.9 g/dL (ref 30.0–36.0)
MCV: 88.1 fL (ref 80.0–100.0)
Platelets: 128 10*3/uL — ABNORMAL LOW (ref 150–400)
RBC: 3.94 MIL/uL (ref 3.87–5.11)
RDW: 12.8 % (ref 11.5–15.5)
WBC: 7 10*3/uL (ref 4.0–10.5)
nRBC: 0 % (ref 0.0–0.2)

## 2019-06-16 MED ORDER — ACETAMINOPHEN 325 MG PO TABS
650.0000 mg | ORAL_TABLET | Freq: Once | ORAL | Status: AC
  Administered 2019-06-16: 650 mg via ORAL
  Filled 2019-06-16: qty 2

## 2019-06-16 NOTE — ED Notes (Signed)
Medium c-collar applied.  Strict instructions to keep neck still and focus straight ahead to avoid movement. Pt alert and talkative.  C/o c-spine tenderness/ head.  Alert x 4.
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
## 2019-06-16 NOTE — Discharge Instructions (Addendum)
Please use Tylenol at home for pain Use gentle movement for any muscular pain Return if you have worsening or changing symptoms Follow-up with your primary care doctor early next week
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
## 2019-07-04 ENCOUNTER — Other Ambulatory Visit: Payer: Self-pay

## 2019-07-04 ENCOUNTER — Ambulatory Visit
Admission: EM | Admit: 2019-07-04 | Discharge: 2019-07-04 | Disposition: A | Discharge status: Home / Self Care | Payer: Medicare Other

## 2019-07-04 DIAGNOSIS — R51 Headache: Secondary | ICD-10-CM

## 2019-07-04 DIAGNOSIS — R29898 Other symptoms and signs involving the musculoskeletal system: Secondary | ICD-10-CM

## 2019-07-04 DIAGNOSIS — R519 Headache, unspecified: Secondary | ICD-10-CM

## 2019-07-04 DIAGNOSIS — R200 Anesthesia of skin: Secondary | ICD-10-CM
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
## 2019-07-04 NOTE — Discharge Instructions (Addendum)
Recommending further evaluation and management in the ED cannot rule out brain bleed in office.  Offered EMS transport, patient declines.  Declines going to the ED for further evaluation and management.  Encouraged patient to call family to discuss concern for brain bleed.  Patient declines.  Patient aware of the risk associated with this decision including end organ damage, failure and/or death.  Patient aware and in agreement with plan.

## 2019-11-12 ENCOUNTER — Other Ambulatory Visit: Payer: Self-pay

## 2019-11-12 ENCOUNTER — Encounter: Payer: Self-pay | Admitting: Cardiovascular Disease

## 2019-11-12 ENCOUNTER — Ambulatory Visit (INDEPENDENT_AMBULATORY_CARE_PROVIDER_SITE_OTHER): Payer: Medicare Other | Admitting: Cardiovascular Disease

## 2019-11-12 VITALS — BP 114/68 | HR 96 | Ht 64.0 in | Wt 129.8 lb

## 2019-11-12 DIAGNOSIS — I10 Essential (primary) hypertension: Secondary | ICD-10-CM | POA: Diagnosis not present

## 2019-11-12 DIAGNOSIS — I25119 Atherosclerotic heart disease of native coronary artery with unspecified angina pectoris: Secondary | ICD-10-CM

## 2019-11-12 DIAGNOSIS — E782 Mixed hyperlipidemia: Secondary | ICD-10-CM | POA: Diagnosis not present

## 2019-11-12 LAB — CBC WITH DIFFERENTIAL/PLATELET
Basophils Absolute: 0.1 10*3/uL (ref 0.0–0.2)
Basos: 1 %
EOS (ABSOLUTE): 0.3 10*3/uL (ref 0.0–0.4)
Eos: 4 %
Hematocrit: 36 % (ref 34.0–46.6)
Hemoglobin: 12.4 g/dL (ref 11.1–15.9)
Immature Grans (Abs): 0 10*3/uL (ref 0.0–0.1)
Immature Granulocytes: 0 %
Lymphocytes Absolute: 1.8 10*3/uL (ref 0.7–3.1)
Lymphs: 23 %
MCH: 30.6 pg (ref 26.6–33.0)
MCHC: 34.4 g/dL (ref 31.5–35.7)
MCV: 89 fL (ref 79–97)
Monocytes Absolute: 0.7 10*3/uL (ref 0.1–0.9)
Monocytes: 9 %
Neutrophils Absolute: 4.9 10*3/uL (ref 1.4–7.0)
Neutrophils: 63 %
Platelets: 162 10*3/uL (ref 150–450)
RBC: 4.05 x10E6/uL (ref 3.77–5.28)
RDW: 12.6 % (ref 11.7–15.4)
WBC: 7.8 10*3/uL (ref 3.4–10.8)

## 2019-11-12 LAB — COMPREHENSIVE METABOLIC PANEL
ALT: 19 IU/L (ref 0–32)
AST: 15 IU/L (ref 0–40)
Albumin/Globulin Ratio: 2.2 (ref 1.2–2.2)
Albumin: 4.2 g/dL (ref 3.6–4.6)
Alkaline Phosphatase: 59 IU/L (ref 39–117)
BUN/Creatinine Ratio: 22 (ref 12–28)
BUN: 23 mg/dL (ref 8–27)
Bilirubin Total: 0.2 mg/dL (ref 0.0–1.2)
CO2: 20 mmol/L (ref 20–29)
Calcium: 9.1 mg/dL (ref 8.7–10.3)
Chloride: 103 mmol/L (ref 96–106)
Creatinine, Ser: 1.05 mg/dL — ABNORMAL HIGH (ref 0.57–1.00)
GFR calc Af Amer: 57 mL/min/{1.73_m2} — ABNORMAL LOW (ref >59)
GFR calc non Af Amer: 49 mL/min/{1.73_m2} — ABNORMAL LOW (ref >59)
Globulin, Total: 1.9 g/dL (ref 1.5–4.5)
Glucose: 144 mg/dL — ABNORMAL HIGH (ref 65–99)
Potassium: 4.6 mmol/L (ref 3.5–5.2)
Sodium: 135 mmol/L (ref 134–144)
Total Protein: 6.1 g/dL (ref 6.0–8.5)

## 2019-11-12 LAB — LIPID PANEL
Chol/HDL Ratio: 5 ratio — ABNORMAL HIGH (ref 0.0–4.4)
Cholesterol, Total: 198 mg/dL (ref 100–199)
HDL: 40 mg/dL (ref >39)
LDL Chol Calc (NIH): 125 mg/dL — ABNORMAL HIGH (ref 0–99)
Triglycerides: 183 mg/dL — ABNORMAL HIGH (ref 0–149)
VLDL Cholesterol Cal: 33 mg/dL (ref 5–40)

## 2019-11-12 MED ORDER — METOPROLOL TARTRATE 50 MG PO TABS: 75.0000 mg | ORAL_TABLET | Freq: Two times a day (BID) | ORAL | 3 refills | Status: DC

## 2019-11-12 NOTE — Progress Notes (Signed)
Cardiology Office Note:    Date:  11/12/2019   ID:  [name] Graham, DOB 12-15-1933, MRN [id number]  PCP:  Glenda Chroman, MD  Cardiologist:  Sherren Mocha, MD  Electrophysiologist:  None   Referring MD: Glenda Chroman, MD   Chief Complaint  Patient presents with  . Coronary Artery Disease    History of Present Illness:    [name] Graham is a 85 y.o. female with a hx of coronary artery disease and CABG in 2009.  Her last heart catheterization in 2014 demonstrated normal right heart pressures with moderate stenosis of the proximal LAD unchanged from her previous heart catheterization.  Graft anatomy was also stable with patency of the saphenous vein graft to diagonal and demonstration of an atretic LIMA to LAD graft.  She is here alone today. She's been feeling a little more limited with her breathing when she walks. Also having some chest discomfort with activity. She has no symptoms with routine activities at home. No orthopnea, PND, or edema. No lightheadedness or syncope.  She remains active and independent.  She continues to work [employer], but they are currently not going in person because of the COVID-19 pandemic.  Past Medical History:  Diagnosis Date  . Anemia    mild normocytic anemia  . Anxiety   . Bundle branch block   . Coronary artery disease    single vessel of the LAD  . Exertional angina (HCC)   . GERD (gastroesophageal reflux disease)   . Lung nodule    lower  . Normal echocardiogram    normal lv function  . Thrombocytopenia (HCC)    platelet count 80,000    Past Surgical History:  Procedure Laterality Date  . CARDIAC CATHETERIZATION N/A 06/12/2015   Procedure: Left Heart Cath and Cors/Grafts Angiography;  Surgeon: Sherren Mocha, MD;  Location: [facility];  Service: Cardiovascular;  Laterality: N/A;  . COLONOSCOPY N/A 10/07/2016   Procedure: COLONOSCOPY;  Surgeon: Rogene Houston, MD;  [facility];  Service: Endoscopy;   Laterality: N/A;  2:45  . CORONARY ARTERY BYPASS GRAFT  11/04/2008     Ivin Poot, M.D.  . Jilda Roche LASER APPLICATION Right [id number]   Procedure: YAG LASER APPLICATION;  Surgeon: Rutherford Guys, MD;  Location: [facility];  Service: Ophthalmology;  Laterality: Right;  . YAG LASER APPLICATION Left [id number]   Procedure: YAG LASER APPLICATION;  Surgeon: Rutherford Guys, MD;  Location: [facility];  Service: Ophthalmology;  Laterality: Left;    Current Medications: Current Meds  Medication Sig  . aspirin 81 MG tablet Take 81 mg by mouth daily.   . Calcium Carbonate-Vit D-Min 600-400 MG-UNIT TABS Take 1 tablet by mouth daily.    . hydrochlorothiazide (MICROZIDE) 12.5 MG capsule Take 1 capsule (12.5 mg total) by mouth daily as needed (swelling).  . isosorbide mononitrate (IMDUR) 120 MG 24 hr tablet Take 1 tablet (120 mg total) by mouth daily.  Marland Kitchen lisinopril (ZESTRIL) 5 MG tablet Take 5 mg by mouth daily.  . metFORMIN (GLUCOPHAGE) 500 MG tablet Take 500 mg by mouth daily.  . metoprolol tartrate (LOPRESSOR) 50 MG tablet Take 1 tablet (50 mg total) by mouth 2 (two) times daily.  . Multiple Vitamins-Minerals (MULTIVITAMIN WITH MINERALS) tablet Take 1 tablet by mouth daily.    . pantoprazole (PROTONIX) 40 MG tablet Take 1 tablet (40 mg total) by mouth daily before breakfast.  . simvastatin (ZOCOR) 40 MG tablet Take 1 tablet (40 mg total)  by mouth at bedtime.     Allergies:   Patient has no known allergies.   Social History   Socioeconomic History  . Marital status: Widowed    Spouse name: Not on file  . Number of children: Not on file  . Years of education: Not on file  . Highest education level: Not on file  Occupational History  . Occupation: RETIRED  Social Needs  . Financial resource strain: Not on file  . Food insecurity    Worry: Not on file    Inability: Not on file  . Transportation needs    Medical: Not on file    Non-medical: Not on file  Tobacco Use  . Smoking status: Never Smoker   . Smokeless tobacco: Never Used  Substance and Sexual Activity  . Alcohol use: No  . Drug use: No  . Sexual activity: Not on file  Lifestyle  . Physical activity    Days per week: Not on file    Minutes per session: Not on file  . Stress: Not on file  Relationships  . Social Herbalist on phone: Not on file    Gets together: Not on file    Attends religious service: Not on file    Active member of club or organization: Not on file    Attends meetings of clubs or organizations: Not on file    Relationship status: Not on file  Other Topics Concern  . Not on file  Social History Narrative  . Not on file     Family History: The patient's family history includes CAD in her sister; Heart failure in her father and mother.  ROS:   Please see the history of present illness.    All other systems reviewed and are negative.  EKGs/Labs/Other Studies Reviewed:    The following studies were reviewed today: Cardiac Catheterization 06-12-2015: Conclusion  1. Prox RCA lesion, 30% stenosed. 2. Prox LAD lesion, 50% stenosed. 3. was injected is small. 4. There is mild disease in the graft. 5. There is mild diffuse vein graft irregularity unchanged from the previous study 6. The left ventricular systolic function is normal.   FINAL CONCLUSIONS:  MILD-MODERATE DIFFUSE PROXIMAL AND MID-LAD STENOSIS  WIDELY PATENT LCX  PATENT RCA, INITIALLY WITH PROXIMAL VASOSPASM RESOLVED WITH IC NTG  VIGOROUS/NORMAL LV FUNCTION  STABLE/IMPROVED FINDINGS FROM PRIOR CATH STUDIES  RECOMMEND CONTINUED MEDICAL MANAGEMENT   EKG:  EKG is ordered today.  The ekg ordered today demonstrates NSR 96 bpm, St-T changes consider inferolateral ischemia.   Recent Labs: 06/16/2019: BUN 13; Creatinine, Ser 0.84; Hemoglobin 12.1; Platelets 128; Potassium 4.1; Sodium 136  Recent Lipid Panel    Component Value Date/Time   CHOL 186 03/25/2017 1030   TRIG 194 (H) 03/25/2017 1030   HDL 39 (L) 03/25/2017  1030   CHOLHDL 4.8 (H) 03/25/2017 1030   CHOLHDL 6 12/19/2014 0941   VLDL 53.8 (H) 12/19/2014 0941   LDLCALC 108 (H) 03/25/2017 1030   LDLDIRECT 109.0 12/19/2014 0941    Physical Exam:    VS:  Wt 129 lb 12.8 oz (58.9 kg)   BMI 22.28 kg/m     Wt Readings from Last 3 Encounters:  11/12/19 129 lb 12.8 oz (58.9 kg)  12/27/18 129 lb 6.4 oz (58.7 kg)  05/15/18 128 lb 3.2 oz (58.2 kg)     GEN:  Well nourished, well developed elderly woman in no acute distress HEENT: Normal NECK: No JVD; right carotid bruit LYMPHATICS: No  lymphadenopathy CARDIAC: RRR, 2/6 SEM at the RUSB RESPIRATORY:  Clear to auscultation without rales, wheezing or rhonchi  ABDOMEN: Soft, non-tender, non-distended MUSCULOSKELETAL:  No edema; No deformity  SKIN: Warm and dry NEUROLOGIC:  Alert and oriented x 3 PSYCHIATRIC:  Normal affect   ASSESSMENT:    1. Coronary artery disease involving native coronary artery of native heart with angina pectoris ([place])   2. Mixed hyperlipidemia   3. Essential hypertension, benign    PLAN:    In order of problems listed above:  1. Will increase metoprolol to 75 mg twice daily.  Continue aspirin for antiplatelet therapy.  Continue treatment with a statin drug and nitrate.  She will contact us if symptoms do not improve or certainly if they worsen.  Otherwise I will see her back in 6 months. 2. Check lipid panel today.  Treated with simvastatin. 3. Blood pressure well controlled on a combination of hydrochlorothiazide, isosorbide, lisinopril, and metoprolol.   Medication Adjustments/Labs and Tests Ordered: Current medicines are reviewed at length with the patient today.  Concerns regarding medicines are outlined above.  No orders of the defined types were placed in this encounter.  No orders of the defined types were placed in this encounter.   There are no Patient Instructions on file for this visit.   Signed, Sherren Mocha, MD  11/12/2019 8:08 AM    [place]

## 2019-11-12 NOTE — Patient Instructions (Signed)
Medication Instructions:  1) INCREASE METOPROLOL to 75 mg (1.5 tablets) twice daily *If you need a refill on your cardiac medications before your next appointment, please call your pharmacy*  Lab Work: TODAY: CMET, CBC, Lipids If you have labs (blood work) drawn today and your tests are completely normal, you will receive your results only by: Marland Kitchen MyChart Message (if you have MyChart) OR . A paper copy in the mail If you have any lab test that is abnormal or we need to change your treatment, we will call you to review the results.  Follow-Up: At [facility], you and your health needs are our priority.  As part of our continuing mission to provide you with exceptional heart care, we have created designated Provider Care Teams.  These Care Teams include your primary Cardiologist (physician) and Advanced Practice Providers (APPs -  Physician Assistants and Nurse Practitioners) who all work together to provide you with the care you need, when you need it. Your next appointment:   6 month(s) The format for your next appointment:   In Person Provider:   You may see Sherren Mocha, MD or one of the following Advanced Practice Providers on your designated Care Team:    Richardson Dopp, PA-C  Vin Wil[place], [place]  Daune Perch, [place]

## 2019-11-21 ENCOUNTER — Telehealth: Payer: Self-pay

## 2019-11-21 DIAGNOSIS — E782 Mixed hyperlipidemia: Secondary | ICD-10-CM

## 2019-11-21 MED ORDER — ROSUVASTATIN CALCIUM 20 MG PO TABS: 20.0000 mg | ORAL_TABLET | Freq: Every day | ORAL | 3 refills | Status: DC

## 2019-11-21 NOTE — Telephone Encounter (Signed)
-----   Message from Sherren Mocha, MD sent at 11/15/2019 10:59 PM EST ----- Labs reviewed. LDL well above goal on simvastatin 40 mg. Would change to rosuvastatin 20 mg if she is comfortable with making this change. Repeat lipid/LFT 3 months. thx

## 2019-11-21 NOTE — Telephone Encounter (Signed)
Reviewed results with patient who verbalized understanding.   Instructed patient to STOP SIMVASTATIN and START CRESTOR 20 mg daily. Scheduled fasting labs 02/17/20. She was grateful for assistance and agrees with treatment plan.

## 2020-01-19 ENCOUNTER — Other Ambulatory Visit: Payer: Self-pay | Admitting: Cardiovascular Disease

## 2020-01-21 DIAGNOSIS — Z6821 Body mass index (BMI) 21.0-21.9, adult: Secondary | ICD-10-CM | POA: Diagnosis not present

## 2020-01-21 DIAGNOSIS — M542 Cervicalgia: Secondary | ICD-10-CM | POA: Diagnosis not present

## 2020-01-21 DIAGNOSIS — E1165 Type 2 diabetes mellitus with hyperglycemia: Secondary | ICD-10-CM | POA: Diagnosis not present

## 2020-01-21 DIAGNOSIS — Z299 Encounter for prophylactic measures, unspecified: Secondary | ICD-10-CM | POA: Diagnosis not present

## 2020-01-21 DIAGNOSIS — I10 Essential (primary) hypertension: Secondary | ICD-10-CM | POA: Diagnosis not present

## 2020-02-11 DIAGNOSIS — Z7982 Long term (current) use of aspirin: Secondary | ICD-10-CM | POA: Diagnosis not present

## 2020-02-11 DIAGNOSIS — R519 Headache, unspecified: Secondary | ICD-10-CM | POA: Diagnosis not present

## 2020-02-11 DIAGNOSIS — M50321 Other cervical disc degeneration at C4-C5 level: Secondary | ICD-10-CM | POA: Diagnosis not present

## 2020-02-11 DIAGNOSIS — S139XXA Sprain of joints and ligaments of unspecified parts of neck, initial encounter: Secondary | ICD-10-CM | POA: Diagnosis not present

## 2020-02-11 DIAGNOSIS — Z79899 Other long term (current) drug therapy: Secondary | ICD-10-CM | POA: Diagnosis not present

## 2020-02-12 DIAGNOSIS — G43909 Migraine, unspecified, not intractable, without status migrainosus: Secondary | ICD-10-CM | POA: Diagnosis not present

## 2020-02-12 DIAGNOSIS — R519 Headache, unspecified: Secondary | ICD-10-CM | POA: Diagnosis not present

## 2020-02-12 DIAGNOSIS — I10 Essential (primary) hypertension: Secondary | ICD-10-CM | POA: Diagnosis not present

## 2020-02-12 DIAGNOSIS — Z299 Encounter for prophylactic measures, unspecified: Secondary | ICD-10-CM | POA: Diagnosis not present

## 2020-02-18 ENCOUNTER — Other Ambulatory Visit: Payer: Medicare Other

## 2020-03-03 DIAGNOSIS — L821 Other seborrheic keratosis: Secondary | ICD-10-CM | POA: Diagnosis not present

## 2020-03-03 DIAGNOSIS — B078 Other viral warts: Secondary | ICD-10-CM | POA: Diagnosis not present

## 2020-03-03 DIAGNOSIS — X32XXXD Exposure to sunlight, subsequent encounter: Secondary | ICD-10-CM | POA: Diagnosis not present

## 2020-03-03 DIAGNOSIS — L570 Actinic keratosis: Secondary | ICD-10-CM | POA: Diagnosis not present

## 2020-03-03 DIAGNOSIS — L820 Inflamed seborrheic keratosis: Secondary | ICD-10-CM | POA: Diagnosis not present

## 2020-04-17 DIAGNOSIS — Z6821 Body mass index (BMI) 21.0-21.9, adult: Secondary | ICD-10-CM | POA: Diagnosis not present

## 2020-04-17 DIAGNOSIS — Z7189 Other specified counseling: Secondary | ICD-10-CM | POA: Diagnosis not present

## 2020-04-17 DIAGNOSIS — I10 Essential (primary) hypertension: Secondary | ICD-10-CM | POA: Diagnosis not present

## 2020-04-17 DIAGNOSIS — Z299 Encounter for prophylactic measures, unspecified: Secondary | ICD-10-CM | POA: Diagnosis not present

## 2020-04-17 DIAGNOSIS — Z0000 Encounter for general adult medical examination without abnormal findings: Secondary | ICD-10-CM | POA: Diagnosis not present

## 2020-04-17 DIAGNOSIS — Z1211 Encounter for screening for malignant neoplasm of colon: Secondary | ICD-10-CM | POA: Diagnosis not present

## 2020-04-21 ENCOUNTER — Telehealth: Payer: Self-pay

## 2020-04-21 NOTE — Telephone Encounter (Signed)
The patient's sister was in for an appointment today and requested Ms. Haig's follow-up arranged.  Called to schedule appointment but several calls would not go through successfully.   Will try again later.

## 2020-04-23 NOTE — Telephone Encounter (Signed)
Scheduled patient for visit with Dr. Burt Knack 7/1. She was grateful for assistance.

## 2020-04-30 DIAGNOSIS — E7800 Pure hypercholesterolemia, unspecified: Secondary | ICD-10-CM | POA: Diagnosis not present

## 2020-04-30 DIAGNOSIS — R5383 Other fatigue: Secondary | ICD-10-CM | POA: Diagnosis not present

## 2020-04-30 DIAGNOSIS — E039 Hypothyroidism, unspecified: Secondary | ICD-10-CM | POA: Diagnosis not present

## 2020-04-30 DIAGNOSIS — Z79899 Other long term (current) drug therapy: Secondary | ICD-10-CM | POA: Diagnosis not present

## 2020-05-29 ENCOUNTER — Other Ambulatory Visit: Payer: Self-pay | Admitting: Internal Medicine

## 2020-05-29 ENCOUNTER — Other Ambulatory Visit: Payer: Self-pay

## 2020-05-29 ENCOUNTER — Ambulatory Visit
Admission: RE | Admit: 2020-05-29 | Discharge: 2020-05-29 | Disposition: A | Discharge status: Home / Self Care | Payer: Medicare Other | Source: Ambulatory Visit | Attending: Internal Medicine | Admitting: Internal Medicine

## 2020-05-29 DIAGNOSIS — Z1231 Encounter for screening mammogram for malignant neoplasm of breast: Secondary | ICD-10-CM | POA: Diagnosis not present

## 2020-05-29 DIAGNOSIS — H353131 Nonexudative age-related macular degeneration, bilateral, early dry stage: Secondary | ICD-10-CM | POA: Diagnosis not present

## 2020-05-29 DIAGNOSIS — H04123 Dry eye syndrome of bilateral lacrimal glands: Secondary | ICD-10-CM | POA: Diagnosis not present

## 2020-05-29 DIAGNOSIS — H401132 Primary open-angle glaucoma, bilateral, moderate stage: Secondary | ICD-10-CM | POA: Diagnosis not present

## 2020-06-03 ENCOUNTER — Other Ambulatory Visit: Payer: Self-pay | Admitting: Internal Medicine

## 2020-06-03 DIAGNOSIS — R928 Other abnormal and inconclusive findings on diagnostic imaging of breast: Secondary | ICD-10-CM

## 2020-06-04 DIAGNOSIS — E1122 Type 2 diabetes mellitus with diabetic chronic kidney disease: Secondary | ICD-10-CM | POA: Diagnosis not present

## 2020-06-04 DIAGNOSIS — Z299 Encounter for prophylactic measures, unspecified: Secondary | ICD-10-CM | POA: Diagnosis not present

## 2020-06-04 DIAGNOSIS — R350 Frequency of micturition: Secondary | ICD-10-CM | POA: Diagnosis not present

## 2020-06-04 DIAGNOSIS — E1165 Type 2 diabetes mellitus with hyperglycemia: Secondary | ICD-10-CM | POA: Diagnosis not present

## 2020-06-04 DIAGNOSIS — N183 Chronic kidney disease, stage 3 unspecified: Secondary | ICD-10-CM | POA: Diagnosis not present

## 2020-06-04 DIAGNOSIS — I10 Essential (primary) hypertension: Secondary | ICD-10-CM | POA: Diagnosis not present

## 2020-06-05 ENCOUNTER — Ambulatory Visit: Payer: Medicare Other | Admitting: Cardiovascular Disease

## 2020-06-17 ENCOUNTER — Other Ambulatory Visit: Payer: Medicare Other

## 2020-06-25 ENCOUNTER — Other Ambulatory Visit: Payer: Self-pay

## 2020-06-25 ENCOUNTER — Ambulatory Visit
Admission: RE | Admit: 2020-06-25 | Discharge: 2020-06-25 | Disposition: A | Discharge status: Home / Self Care | Payer: Medicare Other | Source: Ambulatory Visit | Attending: Internal Medicine | Admitting: Internal Medicine

## 2020-06-25 DIAGNOSIS — N6001 Solitary cyst of right breast: Secondary | ICD-10-CM | POA: Diagnosis not present

## 2020-06-25 DIAGNOSIS — R928 Other abnormal and inconclusive findings on diagnostic imaging of breast: Secondary | ICD-10-CM

## 2020-07-03 DIAGNOSIS — L820 Inflamed seborrheic keratosis: Secondary | ICD-10-CM | POA: Diagnosis not present

## 2020-07-03 DIAGNOSIS — X32XXXD Exposure to sunlight, subsequent encounter: Secondary | ICD-10-CM | POA: Diagnosis not present

## 2020-07-03 DIAGNOSIS — L570 Actinic keratosis: Secondary | ICD-10-CM | POA: Diagnosis not present

## 2020-07-16 ENCOUNTER — Ambulatory Visit: Payer: Medicare Other | Admitting: Cardiovascular Disease

## 2020-07-17 DIAGNOSIS — Z299 Encounter for prophylactic measures, unspecified: Secondary | ICD-10-CM | POA: Diagnosis not present

## 2020-07-17 DIAGNOSIS — D692 Other nonthrombocytopenic purpura: Secondary | ICD-10-CM | POA: Diagnosis not present

## 2020-07-17 DIAGNOSIS — K219 Gastro-esophageal reflux disease without esophagitis: Secondary | ICD-10-CM | POA: Diagnosis not present

## 2020-07-17 DIAGNOSIS — I2510 Atherosclerotic heart disease of native coronary artery without angina pectoris: Secondary | ICD-10-CM | POA: Diagnosis not present

## 2020-07-17 DIAGNOSIS — E039 Hypothyroidism, unspecified: Secondary | ICD-10-CM | POA: Diagnosis not present

## 2020-07-17 DIAGNOSIS — I10 Essential (primary) hypertension: Secondary | ICD-10-CM | POA: Diagnosis not present

## 2020-09-17 DIAGNOSIS — E1165 Type 2 diabetes mellitus with hyperglycemia: Secondary | ICD-10-CM | POA: Diagnosis not present

## 2020-09-17 DIAGNOSIS — I10 Essential (primary) hypertension: Secondary | ICD-10-CM | POA: Diagnosis not present

## 2020-09-17 DIAGNOSIS — Z299 Encounter for prophylactic measures, unspecified: Secondary | ICD-10-CM | POA: Diagnosis not present

## 2020-09-17 DIAGNOSIS — E1122 Type 2 diabetes mellitus with diabetic chronic kidney disease: Secondary | ICD-10-CM | POA: Diagnosis not present

## 2020-09-17 DIAGNOSIS — N183 Chronic kidney disease, stage 3 unspecified: Secondary | ICD-10-CM | POA: Diagnosis not present

## 2020-09-17 DIAGNOSIS — K219 Gastro-esophageal reflux disease without esophagitis: Secondary | ICD-10-CM | POA: Diagnosis not present

## 2020-10-16 DIAGNOSIS — L570 Actinic keratosis: Secondary | ICD-10-CM | POA: Diagnosis not present

## 2020-10-16 DIAGNOSIS — L821 Other seborrheic keratosis: Secondary | ICD-10-CM | POA: Diagnosis not present

## 2020-10-16 DIAGNOSIS — X32XXXD Exposure to sunlight, subsequent encounter: Secondary | ICD-10-CM | POA: Diagnosis not present

## 2020-10-23 ENCOUNTER — Encounter (INDEPENDENT_AMBULATORY_CARE_PROVIDER_SITE_OTHER): Payer: Self-pay

## 2020-10-23 DIAGNOSIS — Z7984 Long term (current) use of oral hypoglycemic drugs: Secondary | ICD-10-CM | POA: Diagnosis not present

## 2020-10-23 DIAGNOSIS — H52203 Unspecified astigmatism, bilateral: Secondary | ICD-10-CM | POA: Diagnosis not present

## 2020-10-23 DIAGNOSIS — H5213 Myopia, bilateral: Secondary | ICD-10-CM | POA: Diagnosis not present

## 2020-10-23 DIAGNOSIS — H524 Presbyopia: Secondary | ICD-10-CM | POA: Diagnosis not present

## 2020-10-23 DIAGNOSIS — E119 Type 2 diabetes mellitus without complications: Secondary | ICD-10-CM | POA: Diagnosis not present

## 2020-10-23 LAB — HM DIABETES EYE EXAM

## 2020-11-06 ENCOUNTER — Encounter (INDEPENDENT_AMBULATORY_CARE_PROVIDER_SITE_OTHER): Payer: Medicare Other | Admitting: Ophthalmology

## 2020-11-13 ENCOUNTER — Encounter (INDEPENDENT_AMBULATORY_CARE_PROVIDER_SITE_OTHER): Payer: Medicare Other | Admitting: Ophthalmology

## 2020-11-19 ENCOUNTER — Other Ambulatory Visit: Payer: Self-pay

## 2020-11-19 ENCOUNTER — Ambulatory Visit (INDEPENDENT_AMBULATORY_CARE_PROVIDER_SITE_OTHER): Payer: Medicare Other | Admitting: Ophthalmology

## 2020-11-19 ENCOUNTER — Encounter (INDEPENDENT_AMBULATORY_CARE_PROVIDER_SITE_OTHER): Payer: Self-pay | Admitting: Ophthalmology

## 2020-11-19 DIAGNOSIS — H43813 Vitreous degeneration, bilateral: Secondary | ICD-10-CM | POA: Insufficient documentation

## 2020-11-19 DIAGNOSIS — H353132 Nonexudative age-related macular degeneration, bilateral, intermediate dry stage: Secondary | ICD-10-CM | POA: Diagnosis not present

## 2020-11-19 NOTE — Progress Notes (Signed)
11/19/2020     CHIEF COMPLAINT Patient presents for Blurred Vision   HISTORY OF PRESENT ILLNESS: [name] Graham is a 86 y.o. female who presents to the clinic today for:   HPI    Blurred Vision    In both eyes.  Onset was gradual.  Vision is blurred.  Severity is moderate.  Occurring constantly.  It is worse throughout the day.  Context:  distance vision, mid-range vision, near vision and watching TV.  Since onset it is stable.  Treatments tried include no treatments.  Response to treatment was no improvement.  I, the attending physician,  performed the HPI with the patient and updated documentation appropriately.          Comments    Pt referred by Dr. Gershon Crane for ARMD OU  Pt states she has noticed an overall decrease in vision. Pt states she does see floaters, usually in the morning and then they go away. Pt on Latanoprost nightly OU       Last edited by Tilda Franco on 11/19/2020  9:46 AM. (History)      Referring physician: Glenda Chroman, MD [place],  [place] [address]  HISTORICAL INFORMATION:   Selected notes from the MEDICAL RECORD NUMBER    Lab Results  Component Value Date   HGBA1C 6.3 (H) 05/15/2018     CURRENT MEDICATIONS: No current outpatient medications on file. (Ophthalmic Drugs)   No current facility-administered medications for this visit. (Ophthalmic Drugs)   Current Outpatient Medications (Other)  Medication Sig  . aspirin 81 MG tablet Take 81 mg by mouth daily.   . Calcium Carbonate-Vit D-Min 600-400 MG-UNIT TABS Take 1 tablet by mouth daily.    . hydrochlorothiazide (MICROZIDE) 12.5 MG capsule Take 1 capsule (12.5 mg total) by mouth daily as needed (swelling).  . isosorbide mononitrate (IMDUR) 120 MG 24 hr tablet Take 1 tablet (120 mg total) by mouth daily.  Marland Kitchen lisinopril (ZESTRIL) 5 MG tablet Take 5 mg by mouth daily.  . metFORMIN (GLUCOPHAGE) 500 MG tablet Take 500 mg by mouth daily.  . metoprolol tartrate (LOPRESSOR) 50 MG  tablet Take 1.5 tablets (75 mg total) by mouth 2 (two) times daily.  . Multiple Vitamins-Minerals (MULTIVITAMIN WITH MINERALS) tablet Take 1 tablet by mouth daily.    . pantoprazole (PROTONIX) 40 MG tablet TAKE 1 TABLET BY MOUTH DAILY BEFORE BREAKFAST  . rosuvastatin (CRESTOR) 20 MG tablet Take 1 tablet (20 mg total) by mouth daily.   No current facility-administered medications for this visit. (Other)      REVIEW OF SYSTEMS:    ALLERGIES No Known Allergies  PAST MEDICAL HISTORY Past Medical History:  Diagnosis Date  . Anemia    mild normocytic anemia  . Anxiety   . Bundle branch block   . Coronary artery disease    single vessel of the LAD  . Exertional angina (HCC)   . GERD (gastroesophageal reflux disease)   . Lung nodule    lower  . Normal echocardiogram    normal lv function  . Thrombocytopenia (HCC)    platelet count 80,000   Past Surgical History:  Procedure Laterality Date  . CARDIAC CATHETERIZATION N/A 06/12/2015   Procedure: Left Heart Cath and Cors/Grafts Angiography;  Surgeon: Sherren Mocha, MD;  Location: [facility];  Service: Cardiovascular;  Laterality: N/A;  . COLONOSCOPY N/A 10/07/2016   Procedure: COLONOSCOPY;  Surgeon: Rogene Houston, MD;  [facility];  Service: Endoscopy;  Laterality: N/A;  2:45  . CORONARY ARTERY BYPASS GRAFT  11/04/2008     Ivin Poot, M.D.  . Jilda Roche LASER APPLICATION Right 0/62/3762   Procedure: YAG LASER APPLICATION;  Surgeon: Rutherford Guys, MD;  Location: [facility];  Service: Ophthalmology;  Laterality: Right;  . YAG LASER APPLICATION Left 08/06/5175   Procedure: YAG LASER APPLICATION;  Surgeon: Rutherford Guys, MD;  Location: [facility];  Service: Ophthalmology;  Laterality: Left;    FAMILY HISTORY Family History  Problem Relation Age of Onset  . Heart failure Mother   . Heart failure Father   . CAD Sister     SOCIAL HISTORY Social History   Tobacco Use  . Smoking status: Never Smoker  . Smokeless  tobacco: Never Used  Vaping Use  . Vaping Use: Never used  Substance Use Topics  . Alcohol use: No  . Drug use: No         OPHTHALMIC EXAM: Base Eye Exam    Visual Acuity (Snellen - Linear)      Right Left   Dist [place] 20/200 -1 20/100 -2   Dist ph [place] 20/80 -2 20/60 -2       Tonometry (Tonopen, 9:56 AM)      Right Left   Pressure 26 17       Tonometry #2 (Tonopen, 9:56 AM)      Right Left   Pressure 26 17       Pupils      Pupils Dark Light Shape React APD   Right PERRL 4 4 Round Minimal None   Left PERRL 4 4 Round Minimal None       Visual Fields (Counting fingers)      Left Right   Restrictions Total inferior temporal deficiency Total superior nasal, inferior nasal deficiencies       Extraocular Movement      Right Left    Full Full       Neuro/Psych    Oriented x3: Yes   Mood/Affect: Normal       Dilation    Both eyes: 1.0% Mydriacyl, 2.5% Phenylephrine @ 9:56 AM        Slit Lamp and Fundus Exam    External Exam      Right Left   External Normal Normal       Slit Lamp Exam      Right Left   Lids/Lashes Normal Normal   Conjunctiva/Sclera White and quiet White and quiet   Cornea Clear Clear   Anterior Chamber Deep and quiet Deep and quiet   Iris Round and reactive Round and reactive   Lens Centered posterior chamber intraocular lens Centered posterior chamber intraocular lens   Vitreous Posterior vitreous detachment Posterior vitreous detachment       Fundus Exam      Right Left   Disc Normal Normal   C/D Ratio 0.15 0.1   Macula Retinal pigment epithelial mottling, no hemorrhage, no macular thickening, no exudates, Intermediate age related macular degeneration, Geographic atrophy in the FAZ Retinal pigment epithelial mottling, no hemorrhage, no macular thickening, no exudates, Intermediate age related macular degeneration, Geographic atrophy in the FAZ   Vessels Normal Normal   Periphery Normal Normal          IMAGING AND PROCEDURES   Imaging and Procedures for 11/19/20           ASSESSMENT/PLAN:  Posterior vitreous detachment of both eyes   The nature of posterior vitreous detachment was discussed with the  patient as well as its physiology, its age prevalence, and its possible implication regarding retinal breaks and detachment.  An informational brochure was given to the patient.  All the patient's questions were answered.  The patient was asked to return if new or different flashes or floaters develops.   Patient was instructed to contact office immediately if any changes were noticed. I explained to the patient that vitreous inside the eye is similar to jello inside a bowl. As the jello melts it can start to pull away from the bowl, similarly the vitreous throughout our lives can begin to pull away from the retina. That process is called a posterior vitreous detachment. In some cases, the vitreous can tug hard enough on the retina to form a retinal tear. I discussed with the patient the signs and symptoms of a retinal detachment.  Do not rub the eye.      ICD-10-CM   1. Intermediate stage nonexudative age-related macular degeneration of both eyes  H35.3132   2. Posterior vitreous detachment of both eyes  H43.813     1.  2.  3.  Ophthalmic Meds Ordered this visit:  No orders of the defined types were placed in this encounter.      Return in about 6 months (around 05/20/2021) for DILATE OU, OCT.  There are no Patient Instructions on file for this visit.   Explained the diagnoses, plan, and follow up with the patient and they expressed understanding.  Patient expressed understanding of the importance of proper follow up care.   Clent Demark Jovian Lembcke M.D. Diseases & Surgery of the Retina and Vitreous Retina & Diabetic [place] 11/19/20     Abbreviations: M myopia (nearsighted); A astigmatism; H hyperopia (farsighted); P presbyopia; Mrx spectacle prescription;  CTL contact lenses; OD right eye; OS left  eye; OU both eyes  XT exotropia; ET esotropia; PEK punctate epithelial keratitis; PEE punctate epithelial erosions; DES dry eye syndrome; MGD meibomian gland dysfunction; ATs artificial tears; PFAT's preservative free artificial tears; [place] nuclear sclerotic cataract; PSC posterior subcapsular cataract; ERM epi-retinal membrane; PVD posterior vitreous detachment; RD retinal detachment; DM diabetes mellitus; DR diabetic retinopathy; NPDR non-proliferative diabetic retinopathy; PDR proliferative diabetic retinopathy; CSME clinically significant macular edema; DME diabetic macular edema; dbh dot blot hemorrhages; CWS cotton wool spot; POAG primary open angle glaucoma; C/D cup-to-disc ratio; HVF humphrey visual field; GVF goldmann visual field; OCT optical coherence tomography; IOP intraocular pressure; BRVO Branch retinal vein occlusion; CRVO central retinal vein occlusion; CRAO central retinal artery occlusion; BRAO branch retinal artery occlusion; RT retinal tear; SB scleral buckle; PPV pars plana vitrectomy; VH Vitreous hemorrhage; PRP panretinal laser photocoagulation; IVK intravitreal kenalog; VMT vitreomacular traction; MH Macular hole;  NVD neovascularization of the disc; NVE neovascularization elsewhere; AREDS age related eye disease study; ARMD age related macular degeneration; POAG primary open angle glaucoma; EBMD epithelial/anterior basement membrane dystrophy; ACIOL anterior chamber intraocular lens; IOL intraocular lens; PCIOL posterior chamber intraocular lens; Phaco/IOL phacoemulsification with intraocular lens placement; Jerome photorefractive keratectomy; LASIK laser assisted in situ keratomileusis; HTN hypertension; DM diabetes mellitus; COPD chronic obstructive pulmonary disease

## 2020-11-19 NOTE — Assessment & Plan Note (Signed)

## 2020-11-24 ENCOUNTER — Telehealth: Payer: Self-pay | Admitting: Cardiovascular Disease

## 2020-11-24 ENCOUNTER — Ambulatory Visit: Payer: Medicare Other | Admitting: Cardiovascular Disease

## 2020-11-24 NOTE — Telephone Encounter (Signed)
° ° °  Pt cancelled her appt with Dr. Burt Knack she said it's a late appt and she needs an earlier appt since she can't drive at night. Advised no available appt, she said to message Valetta Fuller and she can work her in to see Dr. Burt Knack

## 2020-11-24 NOTE — Telephone Encounter (Signed)
Rescheduled patient to 12/15/2020. She was grateful for call and agrees with plan.

## 2020-12-12 DIAGNOSIS — I25119 Atherosclerotic heart disease of native coronary artery with unspecified angina pectoris: Secondary | ICD-10-CM | POA: Diagnosis not present

## 2020-12-12 DIAGNOSIS — N1831 Chronic kidney disease, stage 3a: Secondary | ICD-10-CM | POA: Diagnosis not present

## 2020-12-12 DIAGNOSIS — I10 Essential (primary) hypertension: Secondary | ICD-10-CM | POA: Diagnosis not present

## 2020-12-12 DIAGNOSIS — M25552 Pain in left hip: Secondary | ICD-10-CM | POA: Diagnosis not present

## 2020-12-12 DIAGNOSIS — H6981 Other specified disorders of Eustachian tube, right ear: Secondary | ICD-10-CM | POA: Diagnosis not present

## 2020-12-12 DIAGNOSIS — Z299 Encounter for prophylactic measures, unspecified: Secondary | ICD-10-CM | POA: Diagnosis not present

## 2020-12-15 ENCOUNTER — Ambulatory Visit (INDEPENDENT_AMBULATORY_CARE_PROVIDER_SITE_OTHER): Payer: Medicare Other | Admitting: Cardiovascular Disease

## 2020-12-15 ENCOUNTER — Encounter: Payer: Self-pay | Admitting: Cardiovascular Disease

## 2020-12-15 ENCOUNTER — Other Ambulatory Visit: Payer: Self-pay

## 2020-12-15 VITALS — BP 130/80 | HR 95 | Ht 64.0 in | Wt 122.2 lb

## 2020-12-15 DIAGNOSIS — E782 Mixed hyperlipidemia: Secondary | ICD-10-CM | POA: Diagnosis not present

## 2020-12-15 DIAGNOSIS — E119 Type 2 diabetes mellitus without complications: Secondary | ICD-10-CM

## 2020-12-15 DIAGNOSIS — I25119 Atherosclerotic heart disease of native coronary artery with unspecified angina pectoris: Secondary | ICD-10-CM

## 2020-12-15 DIAGNOSIS — I10 Essential (primary) hypertension: Secondary | ICD-10-CM

## 2020-12-15 MED ORDER — METOPROLOL TARTRATE 50 MG PO TABS: 75.0000 mg | ORAL_TABLET | Freq: Two times a day (BID) | ORAL | 3 refills | Status: DC

## 2020-12-15 MED ORDER — NITROGLYCERIN 0.4 MG SL SUBL: 0.4000 mg | SUBLINGUAL_TABLET | SUBLINGUAL | 3 refills | Status: DC | PRN

## 2020-12-15 MED ORDER — HYDROCHLOROTHIAZIDE 12.5 MG PO CAPS: 12.5000 mg | ORAL_CAPSULE | Freq: Every day | ORAL | 3 refills | Status: DC | PRN

## 2020-12-15 NOTE — Patient Instructions (Signed)
Medication Instructions:  Your provider recommends that you continue on your current medications as directed. Please refer to the Current Medication list given to you today.   *If you need a refill on your cardiac medications before your next appointment, please call your pharmacy*  Testing/Procedures: Your provider has requested that you have a lexiscan myoview. For further information please visit [email or web address]. Please follow instruction sheet, as given.  Follow-Up: At [facility], you and your health needs are our priority.  As part of our continuing mission to provide you with exceptional heart care, we have created designated Provider Care Teams.  These Care Teams include your primary Cardiologist (physician) and Advanced Practice Providers (APPs -  Physician Assistants and Nurse Practitioners) who all work together to provide you with the care you need, when you need it. Your next appointment:   6 month(s) The format for your next appointment:   In Person Provider:   You may see Michael Cooper, MD or one of the following Advanced Practice Providers on your designated Care Team:    Scott Weaver, PA-C  Vin Bhagat, PA-C   

## 2020-12-15 NOTE — Progress Notes (Signed)
Cardiology Office Note:    Date:  12/16/2020   ID:  [name] Graham, DOB [birth year], MRN [id number]  PCP:  Glenda Chroman, MD  [facility] Cardiologist:  Sherren Mocha, MD  [place] Electrophysiologist:  None   Referring MD: Glenda Chroman, MD   Chief Complaint  Patient presents with  . Coronary Artery Disease  . Chest Pain    History of Present Illness:    [name] Graham is a 87 y.o. female with a hx of coronary artery disease status post CABG in 2009.  The patient is here for follow-up evaluation today.  The patient is noted to have chronic angina and she has managed with metoprolol which was increased at the time of her last visit in December 2020.  The patient is here alone today.  States that she feels like she is doing pretty well for her age.  She continues to work at [employer].  She is experiencing more frequent angina that is consistently related to physical activity.  She stops to rest and symptoms go away.  She requests a refill for her nitroglycerin.  Angina is described as a tightness in the sternal area.  There is associated shortness of breath.  No lightheadedness, diaphoresis, or near syncope.  No rest pain.  The patient is compliant with her medications.  Past Medical History:  Diagnosis Date  . Anemia    mild normocytic anemia  . Anxiety   . Bundle branch block   . Coronary artery disease    single vessel of the LAD  . Exertional angina (HCC)   . GERD (gastroesophageal reflux disease)   . Lung nodule    lower  . Normal echocardiogram    normal lv function  . Thrombocytopenia (HCC)    platelet count 80,000    Past Surgical History:  Procedure Laterality Date  . CARDIAC CATHETERIZATION N/A 06/12/2015   Procedure: Left Heart Cath and Cors/Grafts Angiography;  Surgeon: Sherren Mocha, MD;  Location: [facility];  Service: Cardiovascular;  Laterality: N/A;  . COLONOSCOPY N/A 10/07/2016   Procedure: COLONOSCOPY;  Surgeon: Rogene Houston, MD;  [facility];  Service: Endoscopy;  Laterality: N/A;  2:45  . CORONARY ARTERY BYPASS GRAFT  11/04/2008     Ivin Poot, M.D.  . Jilda Roche LASER APPLICATION Right 5/42/7062   Procedure: YAG LASER APPLICATION;  Surgeon: Rutherford Guys, MD;  [facility];  Service: Ophthalmology;  Laterality: Right;  . YAG LASER APPLICATION Left 3/76/2831   Procedure: YAG LASER APPLICATION;  Surgeon: Rutherford Guys, MD;  Location[facility];  Service: Ophthalmology;  Laterality: Left;    Current Medications: Current Meds  Medication Sig  . amoxicillin-clavulanate (AUGMENTIN) 500-125 MG tablet Take 1 tablet by mouth 2 (two) times daily.  Marland Kitchen aspirin 81 MG tablet Take 81 mg by mouth daily.  . Calcium Carbonate-Vit D-Min 600-400 MG-UNIT TABS Take 1 tablet by mouth daily.    . isosorbide mononitrate (IMDUR) 120 MG 24 hr tablet Take 1 tablet (120 mg total) by mouth daily.  Marland Kitchen latanoprost (XALATAN) 0.005 % ophthalmic solution SMARTSIG:In Eye(s)  . lisinopril (ZESTRIL) 5 MG tablet Take 5 mg by mouth daily.  . metFORMIN (GLUCOPHAGE) 500 MG tablet Take 500 mg by mouth daily.  . Multiple Vitamins-Minerals (MULTIVITAMIN WITH MINERALS) tablet Take 1 tablet by mouth daily.    . nitroGLYCERIN (NITROSTAT) 0.4 MG SL tablet Place 1 tablet (0.4 mg total) under the tongue every 5 (five) minutes  as needed for chest pain.  Marland Kitchen omeprazole (PRILOSEC) 40 MG capsule Take 40 mg by mouth daily.  . pantoprazole (PROTONIX) 40 MG tablet TAKE 1 TABLET BY MOUTH DAILY BEFORE BREAKFAST  . predniSONE (DELTASONE) 10 MG tablet Take 10 mg by mouth 2 (two) times daily.  . [DISCONTINUED] hydrochlorothiazide (MICROZIDE) 12.5 MG capsule Take 1 capsule (12.5 mg total) by mouth daily as needed (swelling).     Allergies:   Patient has no known allergies.   Social History   Socioeconomic History  . Marital status: Widowed    Spouse name: Not on file  . Number of children: Not on file  . Years of education: Not on file  .  Highest education level: Not on file  Occupational History  . Occupation: RETIRED  Tobacco Use  . Smoking status: Never Smoker  . Smokeless tobacco: Never Used  Vaping Use  . Vaping Use: Never used  Substance and Sexual Activity  . Alcohol use: No  . Drug use: No  . Sexual activity: Not on file  Other Topics Concern  . Not on file  Social History Narrative  . Not on file   Social Determinants of Health   Financial Resource Strain: Not on file  Food Insecurity: Not on file  Transportation Needs: Not on file  Physical Activity: Not on file  Stress: Not on file  Social Connections: Not on file     Family History: The patient's family history includes CAD in her sister; Heart failure in her father and mother.  ROS:   Please see the history of present illness.    All other systems reviewed and are negative.  EKGs/Labs/Other Studies Reviewed:    The following studies were reviewed today: Cardiac Cath 06/12/2015: Conclusion  1. Prox RCA lesion, 30% stenosed. 2. Prox LAD lesion, 50% stenosed. 3. was injected is small. 4. There is mild disease in the graft. 5. There is mild diffuse vein graft irregularity unchanged from the previous study 6. The left ventricular systolic function is normal.   FINAL CONCLUSIONS:  MILD-MODERATE DIFFUSE PROXIMAL AND MID-LAD STENOSIS  WIDELY PATENT LCX  PATENT RCA, INITIALLY WITH PROXIMAL VASOSPASM RESOLVED WITH IC NTG  VIGOROUS/NORMAL LV FUNCTION  STABLE/IMPROVED FINDINGS FROM PRIOR CATH STUDIES  RECOMMEND CONTINUED MEDICAL MANAGEMENT  Coronary Findings   Diagnostic Dominance: Right  Left Anterior Descending  calcified diffuse . Compared to the previous catheterization studies, this appears less significantly diseased. There may have been a component of vasospasm on previous studies.  Right Coronary Artery  Initially there is catheter induced vasospasm of the proximal right coronary artery. Intracoronary nitroglycerin was  administered and this completely resolved. There is mild nonobstructive proximal plaquing.  Graft To 1st Diag  was injected is small. There is mild disease in the graft. There is mild diffuse vein graft irregularity unchanged from the previous study     EKG:  EKG is ordered today.  The ekg ordered today demonstrates normal sinus rhythm, nonspecific IVCD, LVH with QRS widening  Recent Labs: No results found for requested labs within last 8760 hours.  Recent Lipid Panel    Component Value Date/Time   CHOL 198 11/12/2019 0841   TRIG 183 (H) 11/12/2019 0841   HDL 40 11/12/2019 0841   CHOLHDL 5.0 (H) 11/12/2019 0841   CHOLHDL 6 12/19/2014 0941   VLDL 53.8 (H) 12/19/2014 0941   LDLCALC 125 (H) 11/12/2019 0841   LDLDIRECT 109.0 12/19/2014 0941     Risk Assessment/Calculations:  Physical Exam:    VS:  BP 130/80   Pulse 95   Ht 5\' 4"  (1.626 m)   Wt 122 lb 3.2 oz (55.4 kg)   SpO2 99%   BMI 20.98 kg/m     Wt Readings from Last 3 Encounters:  12/15/20 122 lb 3.2 oz (55.4 kg)  11/12/19 129 lb 12.8 oz (58.9 kg)  12/27/18 129 lb 6.4 oz (58.7 kg)     GEN:  Well nourished, well developed elderly woman in no acute distress HEENT: Normal NECK: No JVD; No carotid bruits LYMPHATICS: No lymphadenopathy CARDIAC: RRR, no murmurs, rubs, gallops RESPIRATORY:  Clear to auscultation without rales, wheezing or rhonchi  ABDOMEN: Soft, non-tender, non-distended MUSCULOSKELETAL:  No edema; No deformity  SKIN: Warm and dry NEUROLOGIC:  Alert and oriented x 3 PSYCHIATRIC:  Normal affect   ASSESSMENT:    1. Coronary artery disease involving native coronary artery of native heart with angina pectoris ([place])   2. Essential hypertension   3. Mixed hyperlipidemia   4. Type 2 diabetes mellitus without complication, without long-term current use of insulin (HCC)    PLAN:    In order of problems listed above:  1. The patient reports progressive symptoms of angina with exertion, CCS  functional class II-III.  She is on good doses of isosorbide and metoprolol for her antianginal program.  I have recommended a stress Myoview (Lexiscan) study.  I reviewed her most recent heart catheterization today from 2016.  She had some vasospasm in the native RCA that resolved with intracoronary nitroglycerin with persistent mild proximal RCA stenosis.  She had moderate diffuse calcific proximal to mid LAD stenosis with an atretic mammary graft. 2. Blood pressure controlled on hydrochlorothiazide, isosorbide, lisinopril, and metoprolol. 3. Patient treated with rosuvastatin 20 mg daily. 4. Treated with metformin.  Followed by her primary care physician.   Shared Decision Making/Informed Consent The risks [chest pain, shortness of breath, cardiac arrhythmias, dizziness, blood pressure fluctuations, myocardial infarction, stroke/transient ischemic attack, nausea, vomiting, allergic reaction, radiation exposure, metallic taste sensation and life-threatening complications (estimated to be 1 in 10,000)], benefits (risk stratification, diagnosing coronary artery disease, treatment guidance) and alternatives of a nuclear stress test were discussed in detail with [name] Graham and she agrees to proceed.  Medication Adjustments/Labs and Tests Ordered: Current medicines are reviewed at length with the patient today.  Concerns regarding medicines are outlined above.  Orders Placed This Encounter  Procedures  . MYOCARDIAL PERFUSION IMAGING  . EKG 12-Lead   Meds ordered this encounter  Medications  . hydrochlorothiazide (MICROZIDE) 12.5 MG capsule    Sig: Take 1 capsule (12.5 mg total) by mouth daily as needed (swelling).    Dispense:  90 capsule    Refill:  3  . metoprolol tartrate (LOPRESSOR) 50 MG tablet    Sig: Take 1.5 tablets (75 mg total) by mouth 2 (two) times daily.    Dispense:  270 tablet    Refill:  3  . nitroGLYCERIN (NITROSTAT) 0.4 MG SL tablet    Sig: Place 1 tablet (0.4 mg total)  under the tongue every 5 (five) minutes as needed for chest pain.    Dispense:  25 tablet    Refill:  3    Patient Instructions  Medication Instructions:  Your provider recommends that you continue on your current medications as directed. Please refer to the Current Medication list given to you today.   *If you need a refill on your cardiac medications before your next appointment, please call  your pharmacy*  Testing/Procedures: Your provider has requested that you have a lexiscan myoview. For further information please visit [email or web address]. Please follow instruction sheet, as given.  Follow-Up: At [facility], you and your health needs are our priority.  As part of our continuing mission to provide you with exceptional heart care, we have created designated Provider Care Teams.  These Care Teams include your primary Cardiologist (physician) and Advanced Practice Providers (APPs -  Physician Assistants and Nurse Practitioners) who all work together to provide you with the care you need, when you need it. Your next appointment:   6 month(s) The format for your next appointment:   In Person Provider:   You may see Sherren Mocha, MD or one of the following Advanced Practice Providers on your designated Care Team:    Richardson Dopp, PA-C  Robbie Lis, [place]      Signed, Sherren Mocha, MD  12/16/2020 6:49 PM    [place]

## 2020-12-24 ENCOUNTER — Telehealth (HOSPITAL_COMMUNITY): Payer: Self-pay | Admitting: *Deleted

## 2020-12-24 ENCOUNTER — Telehealth: Payer: Self-pay | Admitting: Cardiovascular Disease

## 2020-12-24 NOTE — Telephone Encounter (Signed)
The patient requests to cancel her stress test because she is scared to get on a treadmill. Reiterated to the patient that she is scheduled to have [place] and will not be stressed on the treadmill. She was grateful for further clarification and will keep appointment as scheduled.

## 2020-12-24 NOTE — Telephone Encounter (Signed)
Attempted to call patient regarding upcoming appt, no answer, unable to leave a message.  [name] Graham 

## 2020-12-24 NOTE — Telephone Encounter (Signed)
New message:     Patient would like to know could she wait to have her Myocardial perfusion apt be moved until she see him.

## 2020-12-25 ENCOUNTER — Telehealth (HOSPITAL_COMMUNITY): Payer: Self-pay | Admitting: *Deleted

## 2020-12-25 NOTE — Telephone Encounter (Signed)
Patient given detailed instructions per Myocardial Perfusion Study Information Sheet for the test on 12/29/20 at 1000. Patient notified to arrive 15 minutes early and that it is imperative to arrive on time for appointment to keep from having the test rescheduled.  If you need to cancel or reschedule your appointment, please call the office within 24 hours of your appointment. . Patient verbalized understanding.[name] Graham, [name] Graham No mychart available.

## 2020-12-26 ENCOUNTER — Ambulatory Visit: Payer: Medicare Other | Admitting: Cardiovascular Disease

## 2020-12-26 ENCOUNTER — Telehealth (HOSPITAL_COMMUNITY): Payer: Self-pay | Admitting: Cardiovascular Disease

## 2020-12-26 NOTE — Telephone Encounter (Signed)
The patient called back an hour later and rescheduled her stress test.

## 2020-12-26 NOTE — Telephone Encounter (Signed)
Patient called and cancelled Myoview for reason below:  Graham, [name] L     Cancel Rsn: Patient (scared of test. Does not want to do it)   Order will be removed from the [facility].

## 2020-12-29 ENCOUNTER — Telehealth (HOSPITAL_COMMUNITY): Payer: Self-pay | Admitting: Cardiovascular Disease

## 2020-12-29 ENCOUNTER — Encounter (HOSPITAL_COMMUNITY): Payer: Medicare Other

## 2020-12-29 NOTE — Telephone Encounter (Signed)
Patient called and cancelled Myoview for reason below:  12/29/20 8:01 AM  By:  Selinda Orion   Cancel Rsn: Patient (pt is feeling sick and will cb to r/s)   Order will be removed from the [place] and when patient calls back to reschedule we will reinstate the order.

## 2021-02-23 DIAGNOSIS — H401132 Primary open-angle glaucoma, bilateral, moderate stage: Secondary | ICD-10-CM | POA: Diagnosis not present

## 2021-03-05 DIAGNOSIS — H02834 Dermatochalasis of left upper eyelid: Secondary | ICD-10-CM | POA: Diagnosis not present

## 2021-03-05 DIAGNOSIS — H02831 Dermatochalasis of right upper eyelid: Secondary | ICD-10-CM | POA: Diagnosis not present

## 2021-04-14 ENCOUNTER — Telehealth: Payer: Self-pay | Admitting: Cardiovascular Disease

## 2021-04-14 NOTE — Telephone Encounter (Signed)
Patient called in asking to speak to Dr. Burt Knack nurse in regards to how she been feeling. Please advise

## 2021-04-14 NOTE — Telephone Encounter (Signed)
Attempted to call patient back. Unable to leave voicemail.  

## 2021-04-21 DIAGNOSIS — Z6820 Body mass index (BMI) 20.0-20.9, adult: Secondary | ICD-10-CM | POA: Diagnosis not present

## 2021-04-21 DIAGNOSIS — I10 Essential (primary) hypertension: Secondary | ICD-10-CM | POA: Diagnosis not present

## 2021-04-21 DIAGNOSIS — R5383 Other fatigue: Secondary | ICD-10-CM | POA: Diagnosis not present

## 2021-04-21 DIAGNOSIS — E039 Hypothyroidism, unspecified: Secondary | ICD-10-CM | POA: Diagnosis not present

## 2021-04-21 DIAGNOSIS — E1122 Type 2 diabetes mellitus with diabetic chronic kidney disease: Secondary | ICD-10-CM | POA: Diagnosis not present

## 2021-04-21 DIAGNOSIS — Z7189 Other specified counseling: Secondary | ICD-10-CM | POA: Diagnosis not present

## 2021-04-21 DIAGNOSIS — E1165 Type 2 diabetes mellitus with hyperglycemia: Secondary | ICD-10-CM | POA: Diagnosis not present

## 2021-04-21 DIAGNOSIS — Z299 Encounter for prophylactic measures, unspecified: Secondary | ICD-10-CM | POA: Diagnosis not present

## 2021-04-21 DIAGNOSIS — E7800 Pure hypercholesterolemia, unspecified: Secondary | ICD-10-CM | POA: Diagnosis not present

## 2021-04-21 DIAGNOSIS — Z79899 Other long term (current) drug therapy: Secondary | ICD-10-CM | POA: Diagnosis not present

## 2021-04-21 DIAGNOSIS — Z0000 Encounter for general adult medical examination without abnormal findings: Secondary | ICD-10-CM | POA: Diagnosis not present

## 2021-04-30 NOTE — Telephone Encounter (Signed)
The patient states she now wishes to proceed with myoview. Will send to scheduler to arrange.

## 2021-05-06 ENCOUNTER — Telehealth (HOSPITAL_COMMUNITY): Payer: Self-pay | Admitting: Cardiovascular Disease

## 2021-05-06 DIAGNOSIS — G319 Degenerative disease of nervous system, unspecified: Secondary | ICD-10-CM | POA: Diagnosis not present

## 2021-05-06 DIAGNOSIS — M19011 Primary osteoarthritis, right shoulder: Secondary | ICD-10-CM | POA: Diagnosis not present

## 2021-05-06 DIAGNOSIS — W08XXXA Fall from other furniture, initial encounter: Secondary | ICD-10-CM | POA: Diagnosis not present

## 2021-05-06 DIAGNOSIS — Z9889 Other specified postprocedural states: Secondary | ICD-10-CM | POA: Diagnosis not present

## 2021-05-06 DIAGNOSIS — S199XXA Unspecified injury of neck, initial encounter: Secondary | ICD-10-CM | POA: Diagnosis not present

## 2021-05-06 DIAGNOSIS — Z951 Presence of aortocoronary bypass graft: Secondary | ICD-10-CM | POA: Diagnosis not present

## 2021-05-06 DIAGNOSIS — M7918 Myalgia, other site: Secondary | ICD-10-CM | POA: Diagnosis not present

## 2021-05-06 DIAGNOSIS — S4991XA Unspecified injury of right shoulder and upper arm, initial encounter: Secondary | ICD-10-CM | POA: Diagnosis not present

## 2021-05-06 DIAGNOSIS — M47812 Spondylosis without myelopathy or radiculopathy, cervical region: Secondary | ICD-10-CM | POA: Diagnosis not present

## 2021-05-06 DIAGNOSIS — G3189 Other specified degenerative diseases of nervous system: Secondary | ICD-10-CM | POA: Diagnosis not present

## 2021-05-06 DIAGNOSIS — M50322 Other cervical disc degeneration at C5-C6 level: Secondary | ICD-10-CM | POA: Diagnosis not present

## 2021-05-06 DIAGNOSIS — R079 Chest pain, unspecified: Secondary | ICD-10-CM | POA: Diagnosis not present

## 2021-05-06 DIAGNOSIS — I517 Cardiomegaly: Secondary | ICD-10-CM | POA: Diagnosis not present

## 2021-05-06 DIAGNOSIS — M542 Cervicalgia: Secondary | ICD-10-CM | POA: Diagnosis not present

## 2021-05-06 DIAGNOSIS — M958 Other specified acquired deformities of musculoskeletal system: Secondary | ICD-10-CM | POA: Diagnosis not present

## 2021-05-06 DIAGNOSIS — S0990XA Unspecified injury of head, initial encounter: Secondary | ICD-10-CM | POA: Diagnosis not present

## 2021-05-06 DIAGNOSIS — I700 Atherosclerosis of aorta: Secondary | ICD-10-CM | POA: Diagnosis not present

## 2021-05-06 DIAGNOSIS — I6782 Cerebral ischemia: Secondary | ICD-10-CM | POA: Diagnosis not present

## 2021-05-06 NOTE — Telephone Encounter (Signed)
05/06/21 I called patient to reschedule Myoview that she cancelled in January. She did not wish to do so at this time and she will call us back when she is ready to have/LBW 10:31 Order will be removed from the [facility] and if she calls back to reschedule we will reinstate the order. Thank you

## 2021-05-07 DIAGNOSIS — Z299 Encounter for prophylactic measures, unspecified: Secondary | ICD-10-CM | POA: Diagnosis not present

## 2021-05-07 DIAGNOSIS — I10 Essential (primary) hypertension: Secondary | ICD-10-CM | POA: Diagnosis not present

## 2021-05-07 DIAGNOSIS — M791 Myalgia, unspecified site: Secondary | ICD-10-CM | POA: Diagnosis not present

## 2021-05-07 DIAGNOSIS — D692 Other nonthrombocytopenic purpura: Secondary | ICD-10-CM | POA: Diagnosis not present

## 2021-05-20 ENCOUNTER — Encounter (INDEPENDENT_AMBULATORY_CARE_PROVIDER_SITE_OTHER): Payer: Medicare Other | Admitting: Ophthalmology

## 2021-06-18 DIAGNOSIS — Z6820 Body mass index (BMI) 20.0-20.9, adult: Secondary | ICD-10-CM | POA: Diagnosis not present

## 2021-06-18 DIAGNOSIS — Z299 Encounter for prophylactic measures, unspecified: Secondary | ICD-10-CM | POA: Diagnosis not present

## 2021-06-18 DIAGNOSIS — H6982 Other specified disorders of Eustachian tube, left ear: Secondary | ICD-10-CM | POA: Diagnosis not present

## 2021-06-18 DIAGNOSIS — I10 Essential (primary) hypertension: Secondary | ICD-10-CM | POA: Diagnosis not present

## 2021-06-18 DIAGNOSIS — H109 Unspecified conjunctivitis: Secondary | ICD-10-CM | POA: Diagnosis not present

## 2021-06-18 DIAGNOSIS — Z789 Other specified health status: Secondary | ICD-10-CM | POA: Diagnosis not present

## 2021-06-23 ENCOUNTER — Other Ambulatory Visit: Payer: Self-pay | Admitting: Internal Medicine

## 2021-06-23 DIAGNOSIS — Z1231 Encounter for screening mammogram for malignant neoplasm of breast: Secondary | ICD-10-CM

## 2021-07-02 DIAGNOSIS — H02403 Unspecified ptosis of bilateral eyelids: Secondary | ICD-10-CM | POA: Diagnosis not present

## 2021-07-02 DIAGNOSIS — H401132 Primary open-angle glaucoma, bilateral, moderate stage: Secondary | ICD-10-CM | POA: Diagnosis not present

## 2021-07-22 DIAGNOSIS — H0230 Blepharochalasis unspecified eye, unspecified eyelid: Secondary | ICD-10-CM | POA: Diagnosis not present

## 2021-07-22 DIAGNOSIS — H57819 Brow ptosis, unspecified: Secondary | ICD-10-CM | POA: Insufficient documentation

## 2021-07-30 DIAGNOSIS — H10503 Unspecified blepharoconjunctivitis, bilateral: Secondary | ICD-10-CM | POA: Diagnosis not present

## 2021-07-30 DIAGNOSIS — I10 Essential (primary) hypertension: Secondary | ICD-10-CM | POA: Diagnosis not present

## 2021-07-30 DIAGNOSIS — H5710 Ocular pain, unspecified eye: Secondary | ICD-10-CM | POA: Diagnosis not present

## 2021-07-30 DIAGNOSIS — H109 Unspecified conjunctivitis: Secondary | ICD-10-CM | POA: Diagnosis not present

## 2021-07-30 DIAGNOSIS — Z299 Encounter for prophylactic measures, unspecified: Secondary | ICD-10-CM | POA: Diagnosis not present

## 2021-08-18 ENCOUNTER — Ambulatory Visit: Payer: Medicare Other

## 2021-08-28 DIAGNOSIS — E1165 Type 2 diabetes mellitus with hyperglycemia: Secondary | ICD-10-CM | POA: Diagnosis not present

## 2021-08-28 DIAGNOSIS — I10 Essential (primary) hypertension: Secondary | ICD-10-CM | POA: Diagnosis not present

## 2021-08-28 DIAGNOSIS — Z299 Encounter for prophylactic measures, unspecified: Secondary | ICD-10-CM | POA: Diagnosis not present

## 2021-08-28 DIAGNOSIS — M25571 Pain in right ankle and joints of right foot: Secondary | ICD-10-CM | POA: Diagnosis not present

## 2021-08-28 DIAGNOSIS — N1831 Chronic kidney disease, stage 3a: Secondary | ICD-10-CM | POA: Diagnosis not present

## 2021-08-31 DIAGNOSIS — S92354A Nondisplaced fracture of fifth metatarsal bone, right foot, initial encounter for closed fracture: Secondary | ICD-10-CM | POA: Diagnosis not present

## 2021-08-31 DIAGNOSIS — S92351A Displaced fracture of fifth metatarsal bone, right foot, initial encounter for closed fracture: Secondary | ICD-10-CM | POA: Diagnosis not present

## 2021-08-31 DIAGNOSIS — S99921A Unspecified injury of right foot, initial encounter: Secondary | ICD-10-CM | POA: Diagnosis not present

## 2021-08-31 DIAGNOSIS — X501XXA Overexertion from prolonged static or awkward postures, initial encounter: Secondary | ICD-10-CM | POA: Diagnosis not present

## 2021-08-31 DIAGNOSIS — S93401A Sprain of unspecified ligament of right ankle, initial encounter: Secondary | ICD-10-CM | POA: Diagnosis not present

## 2021-08-31 DIAGNOSIS — S9031XA Contusion of right foot, initial encounter: Secondary | ICD-10-CM | POA: Diagnosis not present

## 2021-09-04 DIAGNOSIS — I10 Essential (primary) hypertension: Secondary | ICD-10-CM | POA: Diagnosis not present

## 2021-09-04 DIAGNOSIS — E7800 Pure hypercholesterolemia, unspecified: Secondary | ICD-10-CM | POA: Diagnosis not present

## 2021-09-05 DIAGNOSIS — X501XXA Overexertion from prolonged static or awkward postures, initial encounter: Secondary | ICD-10-CM | POA: Diagnosis not present

## 2021-09-05 DIAGNOSIS — S9031XA Contusion of right foot, initial encounter: Secondary | ICD-10-CM | POA: Diagnosis not present

## 2021-09-05 DIAGNOSIS — S92351A Displaced fracture of fifth metatarsal bone, right foot, initial encounter for closed fracture: Secondary | ICD-10-CM | POA: Diagnosis not present

## 2021-09-05 DIAGNOSIS — S92354D Nondisplaced fracture of fifth metatarsal bone, right foot, subsequent encounter for fracture with routine healing: Secondary | ICD-10-CM | POA: Diagnosis not present

## 2021-09-07 DIAGNOSIS — I10 Essential (primary) hypertension: Secondary | ICD-10-CM | POA: Diagnosis not present

## 2021-09-07 DIAGNOSIS — Z299 Encounter for prophylactic measures, unspecified: Secondary | ICD-10-CM | POA: Diagnosis not present

## 2021-09-07 DIAGNOSIS — J40 Bronchitis, not specified as acute or chronic: Secondary | ICD-10-CM | POA: Diagnosis not present

## 2021-09-07 DIAGNOSIS — M1990 Unspecified osteoarthritis, unspecified site: Secondary | ICD-10-CM | POA: Diagnosis not present

## 2021-09-07 DIAGNOSIS — R0600 Dyspnea, unspecified: Secondary | ICD-10-CM | POA: Diagnosis not present

## 2021-09-14 DIAGNOSIS — S92354D Nondisplaced fracture of fifth metatarsal bone, right foot, subsequent encounter for fracture with routine healing: Secondary | ICD-10-CM | POA: Diagnosis not present

## 2021-09-14 DIAGNOSIS — M791 Myalgia, unspecified site: Secondary | ICD-10-CM | POA: Diagnosis not present

## 2021-09-14 DIAGNOSIS — H109 Unspecified conjunctivitis: Secondary | ICD-10-CM | POA: Diagnosis not present

## 2021-09-14 DIAGNOSIS — S92325D Nondisplaced fracture of second metatarsal bone, left foot, subsequent encounter for fracture with routine healing: Secondary | ICD-10-CM | POA: Diagnosis not present

## 2021-09-14 DIAGNOSIS — I10 Essential (primary) hypertension: Secondary | ICD-10-CM | POA: Diagnosis not present

## 2021-09-14 DIAGNOSIS — M810 Age-related osteoporosis without current pathological fracture: Secondary | ICD-10-CM | POA: Diagnosis not present

## 2021-09-14 DIAGNOSIS — Z23 Encounter for immunization: Secondary | ICD-10-CM | POA: Diagnosis not present

## 2021-09-14 DIAGNOSIS — Z299 Encounter for prophylactic measures, unspecified: Secondary | ICD-10-CM | POA: Diagnosis not present

## 2021-09-14 DIAGNOSIS — J069 Acute upper respiratory infection, unspecified: Secondary | ICD-10-CM | POA: Diagnosis not present

## 2021-10-06 ENCOUNTER — Institutional Professional Consult (permissible substitution): Payer: Medicare Other | Admitting: Plastic Surgery

## 2021-10-15 ENCOUNTER — Telehealth: Payer: Self-pay | Admitting: Cardiovascular Disease

## 2021-10-15 NOTE — Telephone Encounter (Signed)
Patient would like to speak to nurse. Did not go into further detail.

## 2021-10-15 NOTE — Telephone Encounter (Signed)
Someone called and canceled appointment with Dr.Cooper and a spot opened up next week. Offered this appointment to the patient.  Accepted appointment date and time with read back.

## 2021-10-15 NOTE — Telephone Encounter (Signed)
The patient had a 6 month follow up due in July, 2022. It appears like she has been on the waitlist since May and is still unable at make an appointment due to no availability. With the appointments going through March and still no availability she is concerned about not seeing a heart doctor in this time frame or even longer since she still does not have an appointment. Patient is asking Dr. Burt Knack advise what she should do. Offered next available APP in February. Patient stated she only wishes to she an MD when it comes to her heart.

## 2021-10-19 DIAGNOSIS — R059 Cough, unspecified: Secondary | ICD-10-CM | POA: Diagnosis not present

## 2021-10-19 DIAGNOSIS — U071 COVID-19: Secondary | ICD-10-CM | POA: Diagnosis not present

## 2021-10-21 ENCOUNTER — Ambulatory Visit: Payer: Medicare Other | Admitting: Cardiovascular Disease

## 2021-10-26 DIAGNOSIS — H02411 Mechanical ptosis of right eyelid: Secondary | ICD-10-CM | POA: Diagnosis not present

## 2021-10-26 DIAGNOSIS — H02831 Dermatochalasis of right upper eyelid: Secondary | ICD-10-CM | POA: Diagnosis not present

## 2021-10-26 DIAGNOSIS — H53483 Generalized contraction of visual field, bilateral: Secondary | ICD-10-CM | POA: Diagnosis not present

## 2021-10-26 DIAGNOSIS — H02412 Mechanical ptosis of left eyelid: Secondary | ICD-10-CM | POA: Diagnosis not present

## 2021-10-26 DIAGNOSIS — H02423 Myogenic ptosis of bilateral eyelids: Secondary | ICD-10-CM | POA: Diagnosis not present

## 2021-10-26 DIAGNOSIS — H02422 Myogenic ptosis of left eyelid: Secondary | ICD-10-CM | POA: Diagnosis not present

## 2021-10-26 DIAGNOSIS — H57813 Brow ptosis, bilateral: Secondary | ICD-10-CM | POA: Diagnosis not present

## 2021-10-26 DIAGNOSIS — H02421 Myogenic ptosis of right eyelid: Secondary | ICD-10-CM | POA: Diagnosis not present

## 2021-10-26 DIAGNOSIS — H02413 Mechanical ptosis of bilateral eyelids: Secondary | ICD-10-CM | POA: Diagnosis not present

## 2021-10-26 DIAGNOSIS — H0279 Other degenerative disorders of eyelid and periocular area: Secondary | ICD-10-CM | POA: Diagnosis not present

## 2021-10-26 DIAGNOSIS — H02834 Dermatochalasis of left upper eyelid: Secondary | ICD-10-CM | POA: Diagnosis not present

## 2021-11-02 DIAGNOSIS — H53483 Generalized contraction of visual field, bilateral: Secondary | ICD-10-CM | POA: Diagnosis not present

## 2021-11-04 DIAGNOSIS — K219 Gastro-esophageal reflux disease without esophagitis: Secondary | ICD-10-CM | POA: Diagnosis not present

## 2021-11-04 DIAGNOSIS — E039 Hypothyroidism, unspecified: Secondary | ICD-10-CM | POA: Diagnosis not present

## 2021-11-04 DIAGNOSIS — I10 Essential (primary) hypertension: Secondary | ICD-10-CM | POA: Diagnosis not present

## 2021-11-04 DIAGNOSIS — E7800 Pure hypercholesterolemia, unspecified: Secondary | ICD-10-CM | POA: Diagnosis not present

## 2021-11-11 DIAGNOSIS — H524 Presbyopia: Secondary | ICD-10-CM | POA: Diagnosis not present

## 2021-11-11 DIAGNOSIS — Z961 Presence of intraocular lens: Secondary | ICD-10-CM | POA: Diagnosis not present

## 2021-11-11 DIAGNOSIS — H52203 Unspecified astigmatism, bilateral: Secondary | ICD-10-CM | POA: Diagnosis not present

## 2021-11-11 DIAGNOSIS — H353132 Nonexudative age-related macular degeneration, bilateral, intermediate dry stage: Secondary | ICD-10-CM | POA: Diagnosis not present

## 2021-11-11 DIAGNOSIS — H401132 Primary open-angle glaucoma, bilateral, moderate stage: Secondary | ICD-10-CM | POA: Diagnosis not present

## 2021-11-26 DIAGNOSIS — M791 Myalgia, unspecified site: Secondary | ICD-10-CM | POA: Diagnosis not present

## 2021-11-26 DIAGNOSIS — Z20822 Contact with and (suspected) exposure to covid-19: Secondary | ICD-10-CM | POA: Diagnosis not present

## 2021-12-01 DIAGNOSIS — R6889 Other general symptoms and signs: Secondary | ICD-10-CM | POA: Diagnosis not present

## 2021-12-01 DIAGNOSIS — J069 Acute upper respiratory infection, unspecified: Secondary | ICD-10-CM | POA: Diagnosis not present

## 2021-12-01 DIAGNOSIS — Z299 Encounter for prophylactic measures, unspecified: Secondary | ICD-10-CM | POA: Diagnosis not present

## 2021-12-01 DIAGNOSIS — H109 Unspecified conjunctivitis: Secondary | ICD-10-CM | POA: Diagnosis not present

## 2021-12-10 DIAGNOSIS — H353133 Nonexudative age-related macular degeneration, bilateral, advanced atrophic without subfoveal involvement: Secondary | ICD-10-CM | POA: Diagnosis not present

## 2021-12-10 DIAGNOSIS — H3540 Unspecified peripheral retinal degeneration: Secondary | ICD-10-CM | POA: Diagnosis not present

## 2021-12-10 DIAGNOSIS — H43813 Vitreous degeneration, bilateral: Secondary | ICD-10-CM | POA: Diagnosis not present

## 2021-12-10 DIAGNOSIS — Z961 Presence of intraocular lens: Secondary | ICD-10-CM | POA: Diagnosis not present

## 2021-12-28 DIAGNOSIS — N183 Chronic kidney disease, stage 3 unspecified: Secondary | ICD-10-CM | POA: Diagnosis not present

## 2021-12-28 DIAGNOSIS — I10 Essential (primary) hypertension: Secondary | ICD-10-CM | POA: Diagnosis not present

## 2021-12-28 DIAGNOSIS — E1122 Type 2 diabetes mellitus with diabetic chronic kidney disease: Secondary | ICD-10-CM | POA: Diagnosis not present

## 2021-12-28 DIAGNOSIS — Z299 Encounter for prophylactic measures, unspecified: Secondary | ICD-10-CM | POA: Diagnosis not present

## 2021-12-28 DIAGNOSIS — E1165 Type 2 diabetes mellitus with hyperglycemia: Secondary | ICD-10-CM | POA: Diagnosis not present

## 2021-12-31 DIAGNOSIS — Z961 Presence of intraocular lens: Secondary | ICD-10-CM | POA: Diagnosis not present

## 2021-12-31 DIAGNOSIS — H43813 Vitreous degeneration, bilateral: Secondary | ICD-10-CM | POA: Diagnosis not present

## 2021-12-31 DIAGNOSIS — H353133 Nonexudative age-related macular degeneration, bilateral, advanced atrophic without subfoveal involvement: Secondary | ICD-10-CM | POA: Diagnosis not present

## 2021-12-31 DIAGNOSIS — H3540 Unspecified peripheral retinal degeneration: Secondary | ICD-10-CM | POA: Diagnosis not present

## 2022-01-18 ENCOUNTER — Ambulatory Visit: Payer: Medicare Other | Admitting: Physician Assistant

## 2022-01-20 DIAGNOSIS — H353133 Nonexudative age-related macular degeneration, bilateral, advanced atrophic without subfoveal involvement: Secondary | ICD-10-CM | POA: Diagnosis not present

## 2022-01-20 DIAGNOSIS — H43813 Vitreous degeneration, bilateral: Secondary | ICD-10-CM | POA: Diagnosis not present

## 2022-01-20 DIAGNOSIS — H401132 Primary open-angle glaucoma, bilateral, moderate stage: Secondary | ICD-10-CM | POA: Diagnosis not present

## 2022-01-20 DIAGNOSIS — H35432 Paving stone degeneration of retina, left eye: Secondary | ICD-10-CM | POA: Diagnosis not present

## 2022-02-04 DIAGNOSIS — E1165 Type 2 diabetes mellitus with hyperglycemia: Secondary | ICD-10-CM | POA: Diagnosis not present

## 2022-02-04 DIAGNOSIS — H04129 Dry eye syndrome of unspecified lacrimal gland: Secondary | ICD-10-CM | POA: Diagnosis not present

## 2022-02-04 DIAGNOSIS — I10 Essential (primary) hypertension: Secondary | ICD-10-CM | POA: Diagnosis not present

## 2022-02-04 DIAGNOSIS — Z299 Encounter for prophylactic measures, unspecified: Secondary | ICD-10-CM | POA: Diagnosis not present

## 2022-02-04 DIAGNOSIS — G5602 Carpal tunnel syndrome, left upper limb: Secondary | ICD-10-CM | POA: Diagnosis not present

## 2022-02-23 ENCOUNTER — Encounter: Payer: Self-pay | Admitting: Cardiovascular Disease

## 2022-02-23 ENCOUNTER — Ambulatory Visit: Payer: Medicare Other | Admitting: Cardiovascular Disease

## 2022-02-23 ENCOUNTER — Other Ambulatory Visit: Payer: Self-pay

## 2022-02-23 VITALS — BP 124/70 | HR 83 | Ht 64.0 in | Wt 126.8 lb

## 2022-02-23 DIAGNOSIS — I10 Essential (primary) hypertension: Secondary | ICD-10-CM

## 2022-02-23 DIAGNOSIS — I25119 Atherosclerotic heart disease of native coronary artery with unspecified angina pectoris: Secondary | ICD-10-CM | POA: Diagnosis not present

## 2022-02-23 DIAGNOSIS — E782 Mixed hyperlipidemia: Secondary | ICD-10-CM

## 2022-02-23 DIAGNOSIS — R0602 Shortness of breath: Secondary | ICD-10-CM

## 2022-02-23 MED ORDER — METOPROLOL TARTRATE 50 MG PO TABS: 75.0000 mg | ORAL_TABLET | Freq: Two times a day (BID) | ORAL | 3 refills | Status: DC

## 2022-02-23 MED ORDER — LISINOPRIL 5 MG PO TABS: 5.0000 mg | ORAL_TABLET | Freq: Every day | ORAL | 3 refills | Status: DC

## 2022-02-23 MED ORDER — ISOSORBIDE MONONITRATE ER 120 MG PO TB24: 120.0000 mg | ORAL_TABLET | Freq: Every day | ORAL | 3 refills | Status: DC

## 2022-02-23 MED ORDER — ROSUVASTATIN CALCIUM 20 MG PO TABS: 20.0000 mg | ORAL_TABLET | Freq: Every day | ORAL | 3 refills | Status: DC

## 2022-02-23 NOTE — Patient Instructions (Signed)
Medication Instructions:  ?Your physician recommends that you continue on your current medications as directed. Please refer to the Current Medication list given to you today. ? ?*If you need a refill on your cardiac medications before your next appointment, please call your pharmacy* ? ? ?Lab Work: ?CBC, CMET, LIPIDS  ?If you have labs (blood work) drawn today and your tests are completely normal, you will receive your results only by: ?MyChart Message (if you have MyChart) OR ?A paper copy in the mail ?If you have any lab test that is abnormal or we need to change your treatment, we will call you to review the results. ? ? ?Testing/Procedures: ?Chest x-ray ?A chest x-ray takes a picture of the organs and structures inside the chest, including the heart, lungs, and blood vessels. This test can show several things, including, whether the heart is enlarges; whether fluid is building up in the lungs; and whether pacemaker / defibrillator leads are still in place. ? ?[place] Stress Test ?Your physician has requested that you have a lexiscan myoview. For further information please visit [email or web address]. Please follow instruction sheet, as given. ? ? ? ?Follow-Up: ?At [facility], you and your health needs are our priority.  As part of our continuing mission to provide you with exceptional heart care, we have created designated Provider Care Teams.  These Care Teams include your primary Cardiologist (physician) and Advanced Practice Providers (APPs -  Physician Assistants and Nurse Practitioners) who all work together to provide you with the care you need, when you need it. ? ?We recommend signing up for the patient portal called "MyChart".  Sign up information is provided on this After Visit Summary.  MyChart is used to connect with patients for Virtual Visits (Telemedicine).  Patients are able to view lab/test results, encounter notes, upcoming appointments, etc.  Non-urgent messages can be sent to your  provider as well.   ?To learn more about what you can do with MyChart, go to [email or web address].   ? ?Your next appointment:   ?6 month(s) ? ?The format for your next appointment:   ?In Person ? ?Provider:   ?Sherren Mocha, MD   ? ? ?Other Instructions ?See separate instruction sheet for Stress test  ?  ?

## 2022-02-23 NOTE — Progress Notes (Signed)
?Cardiology Office Note:   ? ?Date:  02/23/2022  ? ?ID:  [name] Graham, DOB 12-Aug-1934, MRN [id number] ? ?PCP:  Glenda Chroman, MD ?  ?[facility] Providers ?Cardiologist:  Sherren Mocha, MD    ? ?Referring MD: Glenda Chroman, MD  ? ?Chief Complaint  ?Patient presents with  ? Shortness of Breath  ? ? ?History of Present Illness:   ? ?[name] Graham is a 88 y.o. female with a hx of coronary artery disease status post CABG in 2009.  The patient is here for follow-up evaluation today.  The patient is noted to have chronic angina and she has managed with metoprolol. ? ?She is here with her sister today.  She has been getting along fairly well, but continues to have some limitation from exertional angina and shortness of breath.  She describes substernal chest discomfort if she engages in any moderate level activity.  No orthopnea, PND, leg swelling, or heart palpitations. ? ?Past Medical History:  ?Diagnosis Date  ? Anemia   ? mild normocytic anemia  ? Anxiety   ? Bundle branch block   ? Coronary artery disease   ? single vessel of the LAD  ? Exertional angina (HCC)   ? GERD (gastroesophageal reflux disease)   ? Lung nodule   ? lower  ? Normal echocardiogram   ? normal lv function  ? Thrombocytopenia ([place])   ? platelet count 80,000  ? ? ?Past Surgical History:  ?Procedure Laterality Date  ? CARDIAC CATHETERIZATION N/A 06/12/2015  ? Procedure: Left Heart Cath and Cors/Grafts Angiography;  Surgeon: Sherren Mocha, MD;  Location: [place] [facility];  Service: Cardiovascular;  Laterality: N/A;  ? COLONOSCOPY N/A 10/07/2016  ? Procedure: COLONOSCOPY;  Surgeon: Rogene Houston, MD;  [facility];  Service: Endoscopy;  Laterality: N/A;  2:45  ? CORONARY ARTERY BYPASS GRAFT  11/04/2008   ?  Ivin Poot, M.D.  ? YAG LASER APPLICATION Right 2/44/0102  ? Procedure: YAG LASER APPLICATION;  Surgeon: Rutherford Guys, MD;  Location: [facility];  Service: Ophthalmology;  Laterality: Right;  ? YAG LASER APPLICATION Left  06/30/3663  ? Procedure: YAG LASER APPLICATION;  Surgeon: Rutherford Guys, MD;  Location: [facility];  Service: Ophthalmology;  Laterality: Left;  ? ? ?Current Medications: ?Current Meds  ?Medication Sig  ? aspirin 81 MG tablet Take 81 mg by mouth daily.  ? Calcium Carbonate-Vit D-Min 600-400 MG-UNIT TABS Take 1 tablet by mouth daily.    ? hydrochlorothiazide (MICROZIDE) 12.5 MG capsule Take 1 capsule (12.5 mg total) by mouth daily as needed (swelling).  ? latanoprost (XALATAN) 0.005 % ophthalmic solution SMARTSIG:In Eye(s)  ? metFORMIN (GLUCOPHAGE) 500 MG tablet Take 500 mg by mouth daily.  ? Multiple Vitamins-Minerals (MULTIVITAMIN WITH MINERALS) tablet Take 1 tablet by mouth daily.    ? nitroGLYCERIN (NITROSTAT) 0.4 MG SL tablet Place 1 tablet (0.4 mg total) under the tongue every 5 (five) minutes as needed for chest pain.  ? omeprazole (PRILOSEC) 40 MG capsule Take 40 mg by mouth daily.  ? pantoprazole (PROTONIX) 40 MG tablet TAKE 1 TABLET BY MOUTH DAILY BEFORE BREAKFAST  ? predniSONE (DELTASONE) 10 MG tablet Take 10 mg by mouth 2 (two) times daily.  ? [DISCONTINUED] isosorbide mononitrate (IMDUR) 120 MG 24 hr tablet Take 1 tablet (120 mg total) by mouth daily.  ? [DISCONTINUED] lisinopril (ZESTRIL) 5 MG tablet Take 5 mg by mouth daily.  ?  ? ?Allergies:   Patient has no known  allergies.  ? ?Social History  ? ?Socioeconomic History  ? Marital status: Widowed  ?  Spouse name: Not on file  ? Number of children: Not on file  ? Years of education: Not on file  ? Highest education level: Not on file  ?Occupational History  ? Occupation: RETIRED  ?Tobacco Use  ? Smoking status: Never  ? Smokeless tobacco: Never  ?Vaping Use  ? Vaping Use: Never used  ?Substance and Sexual Activity  ? Alcohol use: No  ? Drug use: No  ? Sexual activity: Not on file  ?Other Topics Concern  ? Not on file  ?Social History Narrative  ? Not on file  ? ?Social Determinants of Health  ? ?Financial Resource Strain: Not on file  ?Food Insecurity: Not  on file  ?Transportation Needs: Not on file  ?Physical Activity: Not on file  ?Stress: Not on file  ?Social Connections: Not on file  ?  ? ?Family History: ?The patient's family history includes CAD in her sister; Heart failure in her father and mother. ? ?ROS:   ?Please see the history of present illness.    ?All other systems reviewed and are negative. ? ?EKGs/Labs/Other Studies Reviewed:   ? ?EKG:  EKG is ordered today.  The ekg ordered today demonstrates normal sinus rhythm 83 bpm, ST and T wave abnormality consider infero--lateral ischemia.  Incomplete left bundle branch block. ? ?Recent Labs: ?No results found for requested labs within last 8760 hours.  ?Recent Lipid Panel ?   ?Component Value Date/Time  ? CHOL 198 11/12/2019 0841  ? TRIG 183 (H) 11/12/2019 0841  ? HDL 40 11/12/2019 0841  ? CHOLHDL 5.0 (H) 11/12/2019 0841  ? CHOLHDL 6 12/19/2014 0941  ? VLDL 53.8 (H) 12/19/2014 0941  ? [place] 125 (H) 11/12/2019 0841  ? LDLDIRECT 109.0 12/19/2014 0941  ? ? ? ?Risk Assessment/Calculations:   ?  ? ?    ? ?Physical Exam:   ? ?VS:  BP 124/70   Pulse 83   Ht '5\' 4"'$  (1.626 m)   Wt 126 lb 12.8 oz (57.5 kg)   SpO2 99%   BMI 21.77 kg/m?    ? ?Wt Readings from Last 3 Encounters:  ?02/23/22 126 lb 12.8 oz (57.5 kg)  ?12/15/20 122 lb 3.2 oz (55.4 kg)  ?11/12/19 129 lb 12.8 oz (58.9 kg)  ?  ? ?GEN:  Well nourished, well developed very pleasant elderly woman in no acute distress ?HEENT: Normal ?NECK: No JVD; No carotid bruits ?LYMPHATICS: No lymphadenopathy ?CARDIAC: RRR, no murmurs, rubs, gallops ?RESPIRATORY:  Clear to auscultation without rales, wheezing or rhonchi  ?ABDOMEN: Soft, non-tender, non-distended ?MUSCULOSKELETAL:  No edema; No deformity  ?SKIN: Warm and dry ?NEUROLOGIC:  Alert and oriented x 3 ?PSYCHIATRIC:  Normal affect  ? ?ASSESSMENT:   ? ?1. Coronary artery disease involving native coronary artery of native heart with angina pectoris ([place])   ?2. Essential hypertension   ?3. Mixed hyperlipidemia   ?4.  Shortness of breath   ? ?PLAN:   ? ?In order of problems listed above: ? ?Clinically stable but significant limitation on maximal medical therapy with high-dose isosorbide and metoprolol.  Recommend Lexiscan stress Myoview for further risk stratification.  Would anticipate continuation of medical therapy unless high risk findings are present. ?Blood pressure is controlled on hydrochlorothiazide, isosorbide, lisinopril, and metoprolol. ?Treated with rosuvastatin 20 mg daily.  Will update labs.  Last LDL cholesterol was 104 mg/dL. ?Check chest x-ray, update labs, stress Myoview as above. ? ?   ? ?  Shared Decision Making/Informed Consent ?The risks [chest pain, shortness of breath, cardiac arrhythmias, dizziness, blood pressure fluctuations, myocardial infarction, stroke/transient ischemic attack, nausea, vomiting, allergic reaction, radiation exposure, metallic taste sensation and life-threatening complications (estimated to be 1 in 10,000)], benefits (risk stratification, diagnosing coronary artery disease, treatment guidance) and alternatives of a nuclear stress test were discussed in detail with Ms. Civello and she agrees to proceed.  ? ? ?Medication Adjustments/Labs and Tests Ordered: ?Current medicines are reviewed at length with the patient today.  Concerns regarding medicines are outlined above.  ?Orders Placed This Encounter  ?Procedures  ? DG Chest 2 View  ? Comprehensive metabolic panel  ? CBC  ? Lipid panel  ? MYOCARDIAL PERFUSION IMAGING  ? EKG 12-Lead  ? ?Meds ordered this encounter  ?Medications  ? isosorbide mononitrate (IMDUR) 120 MG 24 hr tablet  ?  Sig: Take 1 tablet (120 mg total) by mouth daily.  ?  Dispense:  90 tablet  ?  Refill:  3  ? lisinopril (ZESTRIL) 5 MG tablet  ?  Sig: Take 1 tablet (5 mg total) by mouth daily.  ?  Dispense:  90 tablet  ?  Refill:  3  ? metoprolol tartrate (LOPRESSOR) 50 MG tablet  ?  Sig: Take 1.5 tablets (75 mg total) by mouth 2 (two) times daily.  ?  Dispense:  270  tablet  ?  Refill:  3  ? rosuvastatin (CRESTOR) 20 MG tablet  ?  Sig: Take 1 tablet (20 mg total) by mouth daily.  ?  Dispense:  90 tablet  ?  Refill:  3  ? ? ?Patient Instructions  ?Medication Instructions:  ?You

## 2022-03-04 DIAGNOSIS — E7800 Pure hypercholesterolemia, unspecified: Secondary | ICD-10-CM | POA: Diagnosis not present

## 2022-03-04 DIAGNOSIS — K219 Gastro-esophageal reflux disease without esophagitis: Secondary | ICD-10-CM | POA: Diagnosis not present

## 2022-03-04 DIAGNOSIS — I10 Essential (primary) hypertension: Secondary | ICD-10-CM | POA: Diagnosis not present

## 2022-03-04 DIAGNOSIS — E039 Hypothyroidism, unspecified: Secondary | ICD-10-CM | POA: Diagnosis not present

## 2022-03-08 DIAGNOSIS — H401132 Primary open-angle glaucoma, bilateral, moderate stage: Secondary | ICD-10-CM | POA: Diagnosis not present

## 2022-03-10 ENCOUNTER — Telehealth (HOSPITAL_COMMUNITY): Payer: Self-pay | Admitting: *Deleted

## 2022-03-10 NOTE — Telephone Encounter (Signed)
Patient given detailed instructions per Myocardial Perfusion Study Information Sheet for the test on 03/16/22 Patient notified to arrive 15 minutes early and that it is imperative to arrive on time for appointment to keep from having the test rescheduled. ? If you need to cancel or reschedule your appointment, please call the office within 24 hours of your appointment. . Patient verbalized understanding. Kirstie Peri ? ? ?

## 2022-03-16 ENCOUNTER — Ambulatory Visit (HOSPITAL_COMMUNITY): Payer: Medicare Other | Attending: Cardiovascular Disease

## 2022-03-16 ENCOUNTER — Other Ambulatory Visit: Payer: Medicare Other | Admitting: *Deleted

## 2022-03-16 DIAGNOSIS — R0602 Shortness of breath: Secondary | ICD-10-CM | POA: Diagnosis not present

## 2022-03-16 DIAGNOSIS — I25119 Atherosclerotic heart disease of native coronary artery with unspecified angina pectoris: Secondary | ICD-10-CM

## 2022-03-16 DIAGNOSIS — E782 Mixed hyperlipidemia: Secondary | ICD-10-CM

## 2022-03-16 DIAGNOSIS — I10 Essential (primary) hypertension: Secondary | ICD-10-CM | POA: Diagnosis not present

## 2022-03-16 LAB — MYOCARDIAL PERFUSION IMAGING
LV dias vol: 40 mL (ref 46–106)
LV sys vol: 8 mL
Nuc Stress EF: 80 %
Peak HR: 96 {beats}/min
Rest HR: 86 {beats}/min
Rest Nuclear Isotope Dose: 10.3 mCi
SDS: 0
SRS: 0
SSS: 0
ST Depression (mm): 0 mm
Stress Nuclear Isotope Dose: 32.1 mCi
TID: 1.05

## 2022-03-16 LAB — CBC
Hematocrit: 42 % (ref 34.0–46.6)
Hemoglobin: 14.9 g/dL (ref 11.1–15.9)
MCH: 31 pg (ref 26.6–33.0)
MCHC: 35.5 g/dL (ref 31.5–35.7)
MCV: 87 fL (ref 79–97)
Platelets: 152 10*3/uL (ref 150–450)
RBC: 4.81 x10E6/uL (ref 3.77–5.28)
RDW: 13.1 % (ref 11.7–15.4)
WBC: 7.3 10*3/uL (ref 3.4–10.8)

## 2022-03-16 LAB — COMPREHENSIVE METABOLIC PANEL
ALT: 20 IU/L (ref 0–32)
AST: 21 IU/L (ref 0–40)
Albumin/Globulin Ratio: 2.5 — ABNORMAL HIGH (ref 1.2–2.2)
Albumin: 4.8 g/dL — ABNORMAL HIGH (ref 3.6–4.6)
Alkaline Phosphatase: 66 IU/L (ref 44–121)
BUN/Creatinine Ratio: 17 (ref 12–28)
BUN: 16 mg/dL (ref 8–27)
Bilirubin Total: 0.8 mg/dL (ref 0.0–1.2)
CO2: 19 mmol/L — ABNORMAL LOW (ref 20–29)
Calcium: 9.6 mg/dL (ref 8.7–10.3)
Chloride: 100 mmol/L (ref 96–106)
Creatinine, Ser: 0.94 mg/dL (ref 0.57–1.00)
Globulin, Total: 1.9 g/dL (ref 1.5–4.5)
Glucose: 131 mg/dL — ABNORMAL HIGH (ref 70–99)
Potassium: 4.2 mmol/L (ref 3.5–5.2)
Sodium: 134 mmol/L (ref 134–144)
Total Protein: 6.7 g/dL (ref 6.0–8.5)
eGFR: 59 mL/min/{1.73_m2} — ABNORMAL LOW (ref >59)

## 2022-03-16 LAB — LIPID PANEL
Chol/HDL Ratio: 5.2 ratio — ABNORMAL HIGH (ref 0.0–4.4)
Cholesterol, Total: 223 mg/dL — ABNORMAL HIGH (ref 100–199)
HDL: 43 mg/dL (ref >39)
LDL Chol Calc (NIH): 141 mg/dL — ABNORMAL HIGH (ref 0–99)
Triglycerides: 217 mg/dL — ABNORMAL HIGH (ref 0–149)
VLDL Cholesterol Cal: 39 mg/dL (ref 5–40)

## 2022-03-16 MED ORDER — TECHNETIUM TC 99M TETROFOSMIN IV KIT
32.1000 | PACK | Freq: Once | INTRAVENOUS | Status: AC | PRN
  Administered 2022-03-16: 32.1 via INTRAVENOUS
  Filled 2022-03-16: qty 33

## 2022-03-16 MED ORDER — TECHNETIUM TC 99M TETROFOSMIN IV KIT
10.3000 | PACK | Freq: Once | INTRAVENOUS | Status: AC | PRN
  Administered 2022-03-16: 10.3 via INTRAVENOUS
  Filled 2022-03-16: qty 11

## 2022-03-16 MED ORDER — REGADENOSON 0.4 MG/5ML IV SOLN
0.4000 mg | Freq: Once | INTRAVENOUS | Status: AC
  Administered 2022-03-16: 0.4 mg via INTRAVENOUS

## 2022-03-17 DIAGNOSIS — E1165 Type 2 diabetes mellitus with hyperglycemia: Secondary | ICD-10-CM | POA: Diagnosis not present

## 2022-03-17 DIAGNOSIS — I25119 Atherosclerotic heart disease of native coronary artery with unspecified angina pectoris: Secondary | ICD-10-CM | POA: Diagnosis not present

## 2022-03-17 DIAGNOSIS — Z789 Other specified health status: Secondary | ICD-10-CM | POA: Diagnosis not present

## 2022-03-17 DIAGNOSIS — N1831 Chronic kidney disease, stage 3a: Secondary | ICD-10-CM | POA: Diagnosis not present

## 2022-03-17 DIAGNOSIS — Z0000 Encounter for general adult medical examination without abnormal findings: Secondary | ICD-10-CM | POA: Diagnosis not present

## 2022-03-17 DIAGNOSIS — Z6821 Body mass index (BMI) 21.0-21.9, adult: Secondary | ICD-10-CM | POA: Diagnosis not present

## 2022-03-17 DIAGNOSIS — Z7189 Other specified counseling: Secondary | ICD-10-CM | POA: Diagnosis not present

## 2022-03-17 DIAGNOSIS — I10 Essential (primary) hypertension: Secondary | ICD-10-CM | POA: Diagnosis not present

## 2022-03-17 DIAGNOSIS — Z299 Encounter for prophylactic measures, unspecified: Secondary | ICD-10-CM | POA: Diagnosis not present

## 2022-03-24 ENCOUNTER — Telehealth: Payer: Self-pay

## 2022-03-24 ENCOUNTER — Encounter: Payer: Self-pay | Admitting: Cardiovascular Disease

## 2022-03-24 NOTE — Telephone Encounter (Signed)
Unable to reach patient x3 to review lab results and recommendations. Letter mailed out today requesting pt to contact office. ?

## 2022-04-23 DIAGNOSIS — H401132 Primary open-angle glaucoma, bilateral, moderate stage: Secondary | ICD-10-CM | POA: Diagnosis not present

## 2022-04-23 DIAGNOSIS — H35432 Paving stone degeneration of retina, left eye: Secondary | ICD-10-CM | POA: Diagnosis not present

## 2022-04-23 DIAGNOSIS — H353133 Nonexudative age-related macular degeneration, bilateral, advanced atrophic without subfoveal involvement: Secondary | ICD-10-CM | POA: Diagnosis not present

## 2022-04-23 DIAGNOSIS — H43813 Vitreous degeneration, bilateral: Secondary | ICD-10-CM | POA: Diagnosis not present

## 2022-04-28 DIAGNOSIS — I10 Essential (primary) hypertension: Secondary | ICD-10-CM | POA: Diagnosis not present

## 2022-04-28 DIAGNOSIS — Z0000 Encounter for general adult medical examination without abnormal findings: Secondary | ICD-10-CM | POA: Diagnosis not present

## 2022-04-28 DIAGNOSIS — R5383 Other fatigue: Secondary | ICD-10-CM | POA: Diagnosis not present

## 2022-04-28 DIAGNOSIS — E039 Hypothyroidism, unspecified: Secondary | ICD-10-CM | POA: Diagnosis not present

## 2022-04-28 DIAGNOSIS — Z6821 Body mass index (BMI) 21.0-21.9, adult: Secondary | ICD-10-CM | POA: Diagnosis not present

## 2022-04-28 DIAGNOSIS — E7800 Pure hypercholesterolemia, unspecified: Secondary | ICD-10-CM | POA: Diagnosis not present

## 2022-04-28 DIAGNOSIS — Z299 Encounter for prophylactic measures, unspecified: Secondary | ICD-10-CM | POA: Diagnosis not present

## 2022-05-05 DIAGNOSIS — H353133 Nonexudative age-related macular degeneration, bilateral, advanced atrophic without subfoveal involvement: Secondary | ICD-10-CM | POA: Diagnosis not present

## 2022-05-05 DIAGNOSIS — H401132 Primary open-angle glaucoma, bilateral, moderate stage: Secondary | ICD-10-CM | POA: Diagnosis not present

## 2022-05-05 DIAGNOSIS — H35432 Paving stone degeneration of retina, left eye: Secondary | ICD-10-CM | POA: Diagnosis not present

## 2022-05-05 DIAGNOSIS — H43813 Vitreous degeneration, bilateral: Secondary | ICD-10-CM | POA: Diagnosis not present

## 2022-05-31 DIAGNOSIS — E7800 Pure hypercholesterolemia, unspecified: Secondary | ICD-10-CM | POA: Diagnosis not present

## 2022-05-31 DIAGNOSIS — E039 Hypothyroidism, unspecified: Secondary | ICD-10-CM | POA: Diagnosis not present

## 2022-05-31 DIAGNOSIS — E538 Deficiency of other specified B group vitamins: Secondary | ICD-10-CM | POA: Diagnosis not present

## 2022-05-31 DIAGNOSIS — R5383 Other fatigue: Secondary | ICD-10-CM | POA: Diagnosis not present

## 2022-05-31 DIAGNOSIS — Z79899 Other long term (current) drug therapy: Secondary | ICD-10-CM | POA: Diagnosis not present

## 2022-06-07 DIAGNOSIS — Z299 Encounter for prophylactic measures, unspecified: Secondary | ICD-10-CM | POA: Diagnosis not present

## 2022-06-07 DIAGNOSIS — D492 Neoplasm of unspecified behavior of bone, soft tissue, and skin: Secondary | ICD-10-CM | POA: Diagnosis not present

## 2022-06-07 DIAGNOSIS — I10 Essential (primary) hypertension: Secondary | ICD-10-CM | POA: Diagnosis not present

## 2022-06-15 DIAGNOSIS — H53413 Scotoma involving central area, bilateral: Secondary | ICD-10-CM | POA: Diagnosis not present

## 2022-06-15 DIAGNOSIS — H43813 Vitreous degeneration, bilateral: Secondary | ICD-10-CM | POA: Diagnosis not present

## 2022-06-15 DIAGNOSIS — H35432 Paving stone degeneration of retina, left eye: Secondary | ICD-10-CM | POA: Diagnosis not present

## 2022-06-15 DIAGNOSIS — H353133 Nonexudative age-related macular degeneration, bilateral, advanced atrophic without subfoveal involvement: Secondary | ICD-10-CM | POA: Diagnosis not present

## 2022-06-16 ENCOUNTER — Other Ambulatory Visit: Payer: Self-pay | Admitting: Internal Medicine

## 2022-06-16 DIAGNOSIS — Z1231 Encounter for screening mammogram for malignant neoplasm of breast: Secondary | ICD-10-CM

## 2022-07-11 DIAGNOSIS — M25532 Pain in left wrist: Secondary | ICD-10-CM | POA: Diagnosis not present

## 2022-07-11 DIAGNOSIS — S52392A Other fracture of shaft of radius, left arm, initial encounter for closed fracture: Secondary | ICD-10-CM | POA: Diagnosis not present

## 2022-07-11 DIAGNOSIS — S52592A Other fractures of lower end of left radius, initial encounter for closed fracture: Secondary | ICD-10-CM | POA: Diagnosis not present

## 2022-07-11 DIAGNOSIS — M7989 Other specified soft tissue disorders: Secondary | ICD-10-CM | POA: Diagnosis not present

## 2022-07-11 DIAGNOSIS — S62102A Fracture of unspecified carpal bone, left wrist, initial encounter for closed fracture: Secondary | ICD-10-CM | POA: Diagnosis not present

## 2022-07-11 DIAGNOSIS — W010XXA Fall on same level from slipping, tripping and stumbling without subsequent striking against object, initial encounter: Secondary | ICD-10-CM | POA: Diagnosis not present

## 2022-07-12 DIAGNOSIS — Z6821 Body mass index (BMI) 21.0-21.9, adult: Secondary | ICD-10-CM | POA: Diagnosis not present

## 2022-07-12 DIAGNOSIS — Z713 Dietary counseling and surveillance: Secondary | ICD-10-CM | POA: Diagnosis not present

## 2022-07-12 DIAGNOSIS — S52515A Nondisplaced fracture of left radial styloid process, initial encounter for closed fracture: Secondary | ICD-10-CM | POA: Diagnosis not present

## 2022-07-12 DIAGNOSIS — I10 Essential (primary) hypertension: Secondary | ICD-10-CM | POA: Diagnosis not present

## 2022-07-12 DIAGNOSIS — Z299 Encounter for prophylactic measures, unspecified: Secondary | ICD-10-CM | POA: Diagnosis not present

## 2022-07-13 ENCOUNTER — Telehealth: Payer: Self-pay | Admitting: Orthopaedic Surgery

## 2022-07-13 ENCOUNTER — Ambulatory Visit (INDEPENDENT_AMBULATORY_CARE_PROVIDER_SITE_OTHER): Payer: Medicare Other | Admitting: Orthopaedic Surgery

## 2022-07-13 ENCOUNTER — Encounter: Payer: Self-pay | Admitting: Orthopaedic Surgery

## 2022-07-13 VITALS — BP 140/118 | HR 89 | Ht 64.0 in | Wt 125.0 lb

## 2022-07-13 DIAGNOSIS — S52502A Unspecified fracture of the lower end of left radius, initial encounter for closed fracture: Secondary | ICD-10-CM

## 2022-07-13 MED ORDER — HYDROCODONE-ACETAMINOPHEN 5-325 MG PO TABS: ORAL_TABLET | ORAL | 0 refills | Status: DC

## 2022-07-13 NOTE — Progress Notes (Signed)
Subjective:    Patient ID: SACHE SANE, female    DOB: 12/03/1934, 88 y.o.   MRN: [id number]  HPI She fell at home and hurt her left wrist 07-11-22.  She went to [facility] and had X-rays which showed nondisplaced fracture of left distal radius metaphysis.  She was put in splint.  She had no other injury.  I have reviewed the ER reports.  I have independently reviewed and interpreted x-rays of this patient done at another site by another physician or qualified health professional.  She is doing well.  She has some pain, she has no other problem.   Review of Systems  Constitutional:  Positive for activity change.  Musculoskeletal:  Positive for arthralgias and myalgias.  All other systems reviewed and are negative. For Review of Systems, all other systems reviewed and are negative.  The following is a summary of the past history medically, past history surgically, known current medicines, social history and family history.  This information is gathered electronically by the computer from prior information and documentation.  I review this each visit and have found including this information at this point in the chart is beneficial and informative.   Past Medical History:  Diagnosis Date   Anemia    mild normocytic anemia   Anxiety    Bundle branch block    Coronary artery disease    single vessel of the LAD   Exertional angina (HCC)    GERD (gastroesophageal reflux disease)    Lung nodule    lower   Normal echocardiogram    normal lv function   Thrombocytopenia (HCC)    platelet count 80,000    Past Surgical History:  Procedure Laterality Date   CARDIAC CATHETERIZATION N/A 06/12/2015   Procedure: Left Heart Cath and Cors/Grafts Angiography;  Surgeon: Sherren Mocha, MD;  Location: [place] [facility];  Service: Cardiovascular;  Laterality: N/A;   COLONOSCOPY N/A 10/07/2016   Procedure: COLONOSCOPY;  Surgeon: Rogene Houston, MD;  [facility];  Service:  Endoscopy;  Laterality: N/A;  2:45   CORONARY ARTERY BYPASS GRAFT  11/04/2008     Ivin Poot, M.D.   YAG LASER APPLICATION Right 0/27/2536   Procedure: YAG LASER APPLICATION;  Surgeon: Rutherford Guys, MD;  Location[facility];  Service: Ophthalmology;  Laterality: Right;   YAG LASER APPLICATION Left 6/44/0347   Procedure: YAG LASER APPLICATION;  Surgeon: Rutherford Guys, MD;  Location: [facility];  Service: Ophthalmology;  Laterality: Left;    Current Outpatient Medications on File Prior to Visit  Medication Sig Dispense Refill   aspirin 81 MG tablet Take 81 mg by mouth daily.     Calcium Carbonate-Vit D-Min 600-400 MG-UNIT TABS Take 1 tablet by mouth daily.       hydrochlorothiazide (MICROZIDE) 12.5 MG capsule Take 1 capsule (12.5 mg total) by mouth daily as needed (swelling). 90 capsule 3   isosorbide mononitrate (IMDUR) 120 MG 24 hr tablet Take 1 tablet (120 mg total) by mouth daily. 90 tablet 3   latanoprost (XALATAN) 0.005 % ophthalmic solution SMARTSIG:In Eye(s)     lisinopril (ZESTRIL) 5 MG tablet Take 1 tablet (5 mg total) by mouth daily. 90 tablet 3   metFORMIN (GLUCOPHAGE) 500 MG tablet Take 500 mg by mouth daily.     metoprolol tartrate (LOPRESSOR) 50 MG tablet Take 1.5 tablets (75 mg total) by mouth 2 (two) times daily. 270 tablet 3   Multiple Vitamins-Minerals (MULTIVITAMIN WITH MINERALS) tablet Take 1  tablet by mouth daily.       nitroGLYCERIN (NITROSTAT) 0.4 MG SL tablet Place 1 tablet (0.4 mg total) under the tongue every 5 (five) minutes as needed for chest pain. 25 tablet 3   omeprazole (PRILOSEC) 40 MG capsule Take 40 mg by mouth daily.     pantoprazole (PROTONIX) 40 MG tablet TAKE 1 TABLET BY MOUTH DAILY BEFORE BREAKFAST 90 tablet 3   rosuvastatin (CRESTOR) 20 MG tablet Take 1 tablet (20 mg total) by mouth daily. 90 tablet 3   No current facility-administered medications on file prior to visit.    Social History   Socioeconomic History   Marital status: Widowed     Spouse name: Not on file   Number of children: Not on file   Years of education: Not on file   Highest education level: Not on file  Occupational History   Occupation: RETIRED  Tobacco Use   Smoking status: Never   Smokeless tobacco: Never  Vaping Use   Vaping Use: Never used  Substance and Sexual Activity   Alcohol use: No   Drug use: No   Sexual activity: Not on file  Other Topics Concern   Not on file  Social History Narrative   Not on file   Social Determinants of Health   Financial Resource Strain: Not on file  Food Insecurity: Not on file  Transportation Needs: Not on file  Physical Activity: Not on file  Stress: Not on file  Social Connections: Not on file  Intimate Partner Violence: Not on file    Family History  Problem Relation Age of Onset   Heart failure Mother    Heart failure Father    CAD Sister     BP (!) 140/118   Pulse 89   Ht '5\' 4"'$  (1.626 m)   Wt 125 lb (56.7 kg)   BMI 21.46 kg/m   Body mass index is 21.46 kg/m.      Objective:   Physical Exam Vitals and nursing note reviewed. Exam conducted with a chaperone present.  Constitutional:      Appearance: She is well-developed.  HENT:     Head: Normocephalic and atraumatic.  Eyes:     Conjunctiva/sclera: Conjunctivae normal.     Pupils: Pupils are equal, round, and reactive to light.  Cardiovascular:     Rate and Rhythm: Normal rate and regular rhythm.  Pulmonary:     Effort: Pulmonary effort is normal.  Abdominal:     Palpations: Abdomen is soft.  Musculoskeletal:       Arms:     Cervical back: Normal range of motion and neck supple.  Skin:    General: Skin is warm and dry.  Neurological:     Mental Status: She is alert and oriented to person, place, and time.     Cranial Nerves: No cranial nerve deficit.     Motor: No abnormal muscle tone.     Coordination: Coordination normal.     Deep Tendon Reflexes: Reflexes are normal and symmetric. Reflexes normal.  Psychiatric:         Behavior: Behavior normal.        Thought Content: Thought content normal.        Judgment: Judgment normal.           Assessment & Plan:   Encounter Diagnosis  Name Primary?   Traumatic closed nondisplaced fracture of distal end of radius, left, initial encounter Yes   A short arm cast applied.  Instructions  for care given.  Return in one week.  X-rays then in cast.  I have reviewed the [place] web site prior to prescribing narcotic medicine for this patient.  Call if any problem.  Precautions discussed.  Electronically Signed Sanjuana Kava, MD 8/8/20233:32 PM

## 2022-07-13 NOTE — Telephone Encounter (Signed)
Patient came to our office, relays she was treated at [facility], on Sunday, 07/11/22. She is aware to pick up a copy of Xray CD - states she is hoping to see Dr Luna Glasgow today. Patient to call back shortly as calling [facility] now.

## 2022-07-13 NOTE — Patient Instructions (Signed)
Cast or Splint Care, Adult Casts and splints are supports that are worn to protect broken bones and other injuries. A cast or splint may hold a bone still and in the correct position while it heals. Casts and splints may also help with pain, swelling, and muscle spasms. A cast is a hardened support that is usually made of fiberglass or plaster. It is custom-fit to the body and offers more protection than a splint. Most casts cannot be taken off and put back on. A splint is a type of soft support that is usually made from cloth and elastic. It can be adjusted or taken off as needed. Often, when a bone is broken, a splint is put on until the swelling goes down, and then the splint is replaced by a cast. You may need a cast or a splint if you: Have a broken bone. Have a soft-tissue injury. Need to keep an injured body part from moving (keep it immobile) after surgery. What are the risks? In some cases, wearing a cast or splint can cause a reduced blood supply to the wrist or hand or to the foot and toes. This can happen if there is a lot of swelling or if the cast or splint is too tight. Limited blood supply results in a condition called compartment syndrome and can cause permanent damage. Symptoms include: Pain that is getting worse. Numbness and tingling. Changes in skin color, including paleness or a bluish color. Cold fingers or toes. Other complications of wearing a cast or splint can include: Skin irritation that can cause itching, rash, skin sores, or skin infection. Limb stiffness or weakness. How to care for a nonremovable cast or splint  Do not put pressure on any part of the cast or splint until it is fully hardened. This may take several hours. Check the skin around the cast or splint every day. Tell your health care provider about any concerns. Do not stick anything inside the cast or splint to scratch your skin. Doing that increases your risk of infection. You may put lotion on dry  skin around the edges of the cast or splint. Do not put lotion on the skin underneath the cast or splint. Keep the cast or splint clean and dry. How to care for a removable splint Wear the splint as told by your health care provider. Remove it only as told by your health care provider. Check the skin around the splint every day. Tell your health care provider about any concerns. Loosen the splint if your fingers tingle, become numb, or turn cold and blue. Keep the splint clean and dry. Clean your splint as told by your health care provider. Use mild soap and water and let it air-dry. Do not use heat on your splint. Follow these instructions at home: Bathing Do not take baths, swim, or use a hot tub until your health care provider approves. Ask your health care provider if you may take showers. You may only be allowed to take sponge baths. If your cast or splint is not waterproof: Do not let it get wet. Cover it with a watertight covering when you take a bath or shower. Managing pain, stiffness, and swelling  If directed, put ice on the affected area. To do this: If you have a removable cast or splint, remove it as told by your health care provider. Put ice in a plastic bag. Place a towel between your skin and the bag or between your cast and the bag.  Leave the ice on for 20 minutes, 2-3 times a day. Remove the ice if your skin turns bright red. This is very important. If you cannot feel pain, heat, or cold, you have a greater risk of damage to the area. Move your fingers or toes often to reduce stiffness and swelling. Raise (elevate) the injured area above the level of your heart while you are sitting or lying down. Safety Do not use the injured limb to support your body weight until your health care provider says that you can. Use crutches or other assistive devices as told by your health care provider. Ask your health care provider when it is safe to drive if you have a cast or splint on  part of your body. General instructions Take over-the-counter and prescription medicines only as told by your health care provider. Return to your normal activities as told by your health care provider. Ask your health care provider what activities are safe for you. Keep all follow-up visits. This is important. Contact a health care provider if: The skin around the cast or splint gets red or raw. The skin under the cast is extremely itchy or painful. Your cast or splint: Gets damaged. Feels very uncomfortable. Is too tight or too loose. Your cast becomes wet or develops a soft spot or area. You notice a bad smell coming from under your cast. You get an object stuck under your cast. There is fluid leaking through the cast. Get help right away if: You develop any symptoms of compartment syndrome, such as: Severe pain or pressure under the cast. Numbness, tingling, coldness, or pale or bluish skin. The part of your body above or below the cast is swollen and discolored. You cannot feel or move your fingers or toes. You have trouble breathing or shortness of breath. You have chest pain. Your pain gets worse. These symptoms may represent a serious problem that is an emergency. Do not wait to see if the symptoms will go away. Get medical help right away. Call your local emergency services (911 in the [place] Do not drive yourself to the hospital. Summary Casts and splints are worn to protect broken bones and other injuries. Casts and splints should remain clean and dry. Remove your cast or splint only as told by your health care provider. Get help right away if your pain gets worse, or if you have numbness, tingling, or skin that turns cold, blue, or discolored. This information is not intended to replace advice given to you by your health care provider. Make sure you discuss any questions you have with your health care provider. Document Revised: 05/19/2021 Document Reviewed:  05/19/2021 Elsevier Patient Education  [employer].

## 2022-07-14 ENCOUNTER — Ambulatory Visit
Admission: RE | Admit: 2022-07-14 | Discharge: 2022-07-14 | Disposition: A | Discharge status: Home / Self Care | Payer: Medicare Other | Source: Ambulatory Visit | Attending: Internal Medicine | Admitting: Internal Medicine

## 2022-07-14 DIAGNOSIS — Z1231 Encounter for screening mammogram for malignant neoplasm of breast: Secondary | ICD-10-CM

## 2022-07-20 ENCOUNTER — Encounter: Payer: Medicare Other | Admitting: Orthopaedic Surgery

## 2022-07-20 DIAGNOSIS — H35432 Paving stone degeneration of retina, left eye: Secondary | ICD-10-CM | POA: Diagnosis not present

## 2022-07-20 DIAGNOSIS — H353133 Nonexudative age-related macular degeneration, bilateral, advanced atrophic without subfoveal involvement: Secondary | ICD-10-CM | POA: Diagnosis not present

## 2022-07-20 DIAGNOSIS — H43813 Vitreous degeneration, bilateral: Secondary | ICD-10-CM | POA: Diagnosis not present

## 2022-07-21 ENCOUNTER — Encounter: Payer: Self-pay | Admitting: Orthopaedic Surgery

## 2022-07-21 ENCOUNTER — Encounter: Payer: Medicare Other | Admitting: Orthopaedic Surgery

## 2022-07-21 ENCOUNTER — Ambulatory Visit (INDEPENDENT_AMBULATORY_CARE_PROVIDER_SITE_OTHER): Payer: Medicare Other

## 2022-07-21 ENCOUNTER — Ambulatory Visit (INDEPENDENT_AMBULATORY_CARE_PROVIDER_SITE_OTHER): Payer: Medicare Other | Admitting: Orthopaedic Surgery

## 2022-07-21 DIAGNOSIS — S52502D Unspecified fracture of the lower end of left radius, subsequent encounter for closed fracture with routine healing: Secondary | ICD-10-CM | POA: Diagnosis not present

## 2022-07-21 NOTE — Progress Notes (Signed)
I have no problem.  She is doing well with the short arm cast on the left.  NV intact. Cast OK.  X-rays were done of the left wrist, reported separately.  Encounter Diagnosis  Name Primary?   Traumatic closed nondisplaced fracture of distal end of radius, left, with routine healing, subsequent encounter Yes   Return in two weeks.  X-rays then OUT of cast.  Call if any problem.  Precautions discussed.  Electronically Signed Sanjuana Kava, MD 8/16/202310:30 AM

## 2022-08-03 ENCOUNTER — Ambulatory Visit: Payer: Medicare Other

## 2022-08-03 ENCOUNTER — Ambulatory Visit (INDEPENDENT_AMBULATORY_CARE_PROVIDER_SITE_OTHER): Payer: Medicare Other

## 2022-08-03 ENCOUNTER — Encounter: Payer: Self-pay | Admitting: Orthopaedic Surgery

## 2022-08-03 ENCOUNTER — Ambulatory Visit (INDEPENDENT_AMBULATORY_CARE_PROVIDER_SITE_OTHER): Payer: Medicare Other | Admitting: Orthopaedic Surgery

## 2022-08-03 DIAGNOSIS — S52502D Unspecified fracture of the lower end of left radius, subsequent encounter for closed fracture with routine healing: Secondary | ICD-10-CM

## 2022-08-03 NOTE — Progress Notes (Signed)
My wrist is not that sore  She has been in cast for three weeks.  She is doing well.  Cast removed.  X-rays done of the left wrist, reported separately.  Encounter Diagnosis  Name Primary?   Traumatic closed nondisplaced fracture of distal end of radius, left, with routine healing, subsequent encounter Yes   NV intact.  ROM good.  A cock-up splint given.  Instructions given.  Return in three weeks.  X-rays then of left wrist.  Call if any problem.  Precautions discussed.  Electronically Signed Sanjuana Kava, MD 8/29/20232:10 PM

## 2022-08-04 ENCOUNTER — Ambulatory Visit: Payer: Medicare Other | Admitting: Orthopaedic Surgery

## 2022-08-05 DIAGNOSIS — I10 Essential (primary) hypertension: Secondary | ICD-10-CM | POA: Diagnosis not present

## 2022-08-05 DIAGNOSIS — E039 Hypothyroidism, unspecified: Secondary | ICD-10-CM | POA: Diagnosis not present

## 2022-08-24 ENCOUNTER — Encounter: Payer: Self-pay | Admitting: Orthopaedic Surgery

## 2022-08-24 ENCOUNTER — Ambulatory Visit (INDEPENDENT_AMBULATORY_CARE_PROVIDER_SITE_OTHER): Payer: Medicare Other | Admitting: Orthopaedic Surgery

## 2022-08-24 ENCOUNTER — Ambulatory Visit (INDEPENDENT_AMBULATORY_CARE_PROVIDER_SITE_OTHER): Payer: Medicare Other

## 2022-08-24 DIAGNOSIS — H6991 Unspecified Eustachian tube disorder, right ear: Secondary | ICD-10-CM | POA: Diagnosis not present

## 2022-08-24 DIAGNOSIS — S52502D Unspecified fracture of the lower end of left radius, subsequent encounter for closed fracture with routine healing: Secondary | ICD-10-CM

## 2022-08-24 DIAGNOSIS — N1831 Chronic kidney disease, stage 3a: Secondary | ICD-10-CM | POA: Diagnosis not present

## 2022-08-24 DIAGNOSIS — G5602 Carpal tunnel syndrome, left upper limb: Secondary | ICD-10-CM

## 2022-08-24 DIAGNOSIS — I10 Essential (primary) hypertension: Secondary | ICD-10-CM | POA: Diagnosis not present

## 2022-08-24 DIAGNOSIS — I25119 Atherosclerotic heart disease of native coronary artery with unspecified angina pectoris: Secondary | ICD-10-CM | POA: Diagnosis not present

## 2022-08-24 DIAGNOSIS — Z299 Encounter for prophylactic measures, unspecified: Secondary | ICD-10-CM | POA: Diagnosis not present

## 2022-08-24 NOTE — Patient Instructions (Signed)
[facility] [place] PHONE: [phone]   Call us when you are ready for me to place your referral.

## 2022-08-24 NOTE — Progress Notes (Signed)
My hand is numb.  Her left wrist is doing well.  However, she has numbness in the median nerve distribution now.  I will get EMGs.  I have explained about carpal tunnel.  Left wrist has full ROM now but she has positive Tinel and Phalen on the left, decreased sensation median nerve distribution.  Encounter Diagnoses  Name Primary?   Traumatic closed nondisplaced fracture of distal end of radius, left, with routine healing, subsequent encounter Yes   Carpal tunnel syndrome, left upper limb    Get EMGs.  I will discharge about her wrist fracture.  Return in two weeks.  Call if any problem.  Precautions discussed.  Electronically Signed Sanjuana Kava, MD 9/19/20233:17 PM

## 2022-08-25 DIAGNOSIS — H35432 Paving stone degeneration of retina, left eye: Secondary | ICD-10-CM | POA: Diagnosis not present

## 2022-08-25 DIAGNOSIS — H43813 Vitreous degeneration, bilateral: Secondary | ICD-10-CM | POA: Diagnosis not present

## 2022-08-25 DIAGNOSIS — H353133 Nonexudative age-related macular degeneration, bilateral, advanced atrophic without subfoveal involvement: Secondary | ICD-10-CM | POA: Diagnosis not present

## 2022-09-01 DIAGNOSIS — Z789 Other specified health status: Secondary | ICD-10-CM | POA: Diagnosis not present

## 2022-09-01 DIAGNOSIS — I10 Essential (primary) hypertension: Secondary | ICD-10-CM | POA: Diagnosis not present

## 2022-09-01 DIAGNOSIS — R42 Dizziness and giddiness: Secondary | ICD-10-CM | POA: Diagnosis not present

## 2022-09-01 DIAGNOSIS — Z299 Encounter for prophylactic measures, unspecified: Secondary | ICD-10-CM | POA: Diagnosis not present

## 2022-09-02 ENCOUNTER — Ambulatory Visit: Payer: Medicare Other | Admitting: Cardiovascular Disease

## 2022-09-21 ENCOUNTER — Encounter: Payer: Self-pay | Admitting: Orthopaedic Surgery

## 2022-09-21 ENCOUNTER — Ambulatory Visit: Payer: Medicare Other | Admitting: Orthopaedic Surgery

## 2022-09-29 DIAGNOSIS — H353133 Nonexudative age-related macular degeneration, bilateral, advanced atrophic without subfoveal involvement: Secondary | ICD-10-CM | POA: Diagnosis not present

## 2022-11-03 DIAGNOSIS — H353114 Nonexudative age-related macular degeneration, right eye, advanced atrophic with subfoveal involvement: Secondary | ICD-10-CM | POA: Diagnosis not present

## 2022-11-03 DIAGNOSIS — H35432 Paving stone degeneration of retina, left eye: Secondary | ICD-10-CM | POA: Diagnosis not present

## 2022-11-03 DIAGNOSIS — H43813 Vitreous degeneration, bilateral: Secondary | ICD-10-CM | POA: Diagnosis not present

## 2022-11-03 DIAGNOSIS — H353134 Nonexudative age-related macular degeneration, bilateral, advanced atrophic with subfoveal involvement: Secondary | ICD-10-CM | POA: Diagnosis not present

## 2022-11-10 DIAGNOSIS — H524 Presbyopia: Secondary | ICD-10-CM | POA: Diagnosis not present

## 2022-11-10 DIAGNOSIS — H52203 Unspecified astigmatism, bilateral: Secondary | ICD-10-CM | POA: Diagnosis not present

## 2022-11-10 DIAGNOSIS — Z961 Presence of intraocular lens: Secondary | ICD-10-CM | POA: Diagnosis not present

## 2022-11-10 DIAGNOSIS — H353132 Nonexudative age-related macular degeneration, bilateral, intermediate dry stage: Secondary | ICD-10-CM | POA: Diagnosis not present

## 2022-11-10 DIAGNOSIS — H401132 Primary open-angle glaucoma, bilateral, moderate stage: Secondary | ICD-10-CM | POA: Diagnosis not present

## 2022-11-10 DIAGNOSIS — H5213 Myopia, bilateral: Secondary | ICD-10-CM | POA: Diagnosis not present

## 2022-12-10 DIAGNOSIS — H353114 Nonexudative age-related macular degeneration, right eye, advanced atrophic with subfoveal involvement: Secondary | ICD-10-CM | POA: Diagnosis not present

## 2022-12-10 DIAGNOSIS — H43813 Vitreous degeneration, bilateral: Secondary | ICD-10-CM | POA: Diagnosis not present

## 2022-12-10 DIAGNOSIS — H353134 Nonexudative age-related macular degeneration, bilateral, advanced atrophic with subfoveal involvement: Secondary | ICD-10-CM | POA: Diagnosis not present

## 2022-12-10 DIAGNOSIS — H35432 Paving stone degeneration of retina, left eye: Secondary | ICD-10-CM | POA: Diagnosis not present

## 2022-12-10 DIAGNOSIS — H04129 Dry eye syndrome of unspecified lacrimal gland: Secondary | ICD-10-CM | POA: Diagnosis not present

## 2022-12-13 ENCOUNTER — Telehealth: Payer: Self-pay

## 2022-12-13 NOTE — Telephone Encounter (Signed)
Patient called and just left her name and number on voicemail. I returned her call and no answer, voicemail box is not setup.

## 2022-12-22 ENCOUNTER — Ambulatory Visit: Payer: Medicare Other | Admitting: Orthopaedic Surgery

## 2022-12-23 ENCOUNTER — Encounter: Payer: Self-pay | Admitting: Orthopaedic Surgery

## 2022-12-23 ENCOUNTER — Ambulatory Visit (INDEPENDENT_AMBULATORY_CARE_PROVIDER_SITE_OTHER): Payer: Medicare Other | Admitting: Orthopaedic Surgery

## 2022-12-23 VITALS — BP 142/83 | HR 90 | Ht 64.0 in | Wt 127.0 lb

## 2022-12-23 DIAGNOSIS — M654 Radial styloid tenosynovitis [de Quervain]: Secondary | ICD-10-CM | POA: Diagnosis not present

## 2022-12-23 MED ORDER — NAPROXEN 500 MG PO TABS: 500.0000 mg | ORAL_TABLET | Freq: Two times a day (BID) | ORAL | 5 refills | Status: DC

## 2022-12-23 MED ORDER — METHYLPREDNISOLONE ACETATE 40 MG/ML IJ SUSP
40.0000 mg | Freq: Once | INTRAMUSCULAR | Status: AC
  Administered 2022-12-23: 40 mg via INTRA_ARTICULAR

## 2022-12-23 NOTE — Progress Notes (Signed)
My left thumb hurts.  She has developed pain over the first compartment of the left wrist over the last three to four weeks.  It is not getting better.  She has no trauma, no redness, no swelling.  She has positive pain Wynn M[place]lin over the first extensor compartment of the left wrist.  ROM of wrist is good.  No redness, no swelling.  Encounter Diagnosis  Name Primary?   De Quervain's disease (tenosynovitis) Yes   Procedure note: After permission from the patient and prep of the left thumb first extensor compartment area, I injected 1 % Xylocaine and 1 cc DepoMedrol 40 into the tendon sheath area by sterile technique tolerated well.  A thumb splint has been given.  I will call in naprosyn for her.  Return in two weeks.  Call if any problem.  Precautions discussed.  Electronically Signed Sanjuana Kava, MD 1/18/202410:06 AM

## 2022-12-28 ENCOUNTER — Ambulatory Visit: Payer: Medicare Other | Admitting: Cardiovascular Disease

## 2023-01-04 ENCOUNTER — Ambulatory Visit: Payer: Medicare Other | Admitting: Orthopaedic Surgery

## 2023-01-18 DIAGNOSIS — H353134 Nonexudative age-related macular degeneration, bilateral, advanced atrophic with subfoveal involvement: Secondary | ICD-10-CM | POA: Diagnosis not present

## 2023-01-18 DIAGNOSIS — H04129 Dry eye syndrome of unspecified lacrimal gland: Secondary | ICD-10-CM | POA: Diagnosis not present

## 2023-01-18 DIAGNOSIS — H35432 Paving stone degeneration of retina, left eye: Secondary | ICD-10-CM | POA: Diagnosis not present

## 2023-01-18 DIAGNOSIS — H43813 Vitreous degeneration, bilateral: Secondary | ICD-10-CM | POA: Diagnosis not present

## 2023-02-22 DIAGNOSIS — H353134 Nonexudative age-related macular degeneration, bilateral, advanced atrophic with subfoveal involvement: Secondary | ICD-10-CM | POA: Diagnosis not present

## 2023-02-22 DIAGNOSIS — H35432 Paving stone degeneration of retina, left eye: Secondary | ICD-10-CM | POA: Diagnosis not present

## 2023-02-22 DIAGNOSIS — H40051 Ocular hypertension, right eye: Secondary | ICD-10-CM | POA: Diagnosis not present

## 2023-02-22 DIAGNOSIS — H43813 Vitreous degeneration, bilateral: Secondary | ICD-10-CM | POA: Diagnosis not present

## 2023-02-22 DIAGNOSIS — H04123 Dry eye syndrome of bilateral lacrimal glands: Secondary | ICD-10-CM | POA: Diagnosis not present

## 2023-03-10 DIAGNOSIS — H401132 Primary open-angle glaucoma, bilateral, moderate stage: Secondary | ICD-10-CM | POA: Diagnosis not present

## 2023-03-29 DIAGNOSIS — H40051 Ocular hypertension, right eye: Secondary | ICD-10-CM | POA: Diagnosis not present

## 2023-03-29 DIAGNOSIS — H35432 Paving stone degeneration of retina, left eye: Secondary | ICD-10-CM | POA: Diagnosis not present

## 2023-03-29 DIAGNOSIS — H353114 Nonexudative age-related macular degeneration, right eye, advanced atrophic with subfoveal involvement: Secondary | ICD-10-CM | POA: Diagnosis not present

## 2023-03-29 DIAGNOSIS — H353124 Nonexudative age-related macular degeneration, left eye, advanced atrophic with subfoveal involvement: Secondary | ICD-10-CM | POA: Diagnosis not present

## 2023-03-29 DIAGNOSIS — H353134 Nonexudative age-related macular degeneration, bilateral, advanced atrophic with subfoveal involvement: Secondary | ICD-10-CM | POA: Diagnosis not present

## 2023-03-29 DIAGNOSIS — H04123 Dry eye syndrome of bilateral lacrimal glands: Secondary | ICD-10-CM | POA: Diagnosis not present

## 2023-03-29 DIAGNOSIS — H43813 Vitreous degeneration, bilateral: Secondary | ICD-10-CM | POA: Diagnosis not present

## 2023-04-01 DIAGNOSIS — I10 Essential (primary) hypertension: Secondary | ICD-10-CM | POA: Diagnosis not present

## 2023-04-01 DIAGNOSIS — Z299 Encounter for prophylactic measures, unspecified: Secondary | ICD-10-CM | POA: Diagnosis not present

## 2023-04-01 DIAGNOSIS — I739 Peripheral vascular disease, unspecified: Secondary | ICD-10-CM | POA: Diagnosis not present

## 2023-04-01 DIAGNOSIS — Z7189 Other specified counseling: Secondary | ICD-10-CM | POA: Diagnosis not present

## 2023-04-01 DIAGNOSIS — Z0000 Encounter for general adult medical examination without abnormal findings: Secondary | ICD-10-CM | POA: Diagnosis not present

## 2023-04-01 DIAGNOSIS — N183 Chronic kidney disease, stage 3 unspecified: Secondary | ICD-10-CM | POA: Diagnosis not present

## 2023-05-03 DIAGNOSIS — I10 Essential (primary) hypertension: Secondary | ICD-10-CM | POA: Diagnosis not present

## 2023-05-03 DIAGNOSIS — K219 Gastro-esophageal reflux disease without esophagitis: Secondary | ICD-10-CM | POA: Diagnosis not present

## 2023-05-03 DIAGNOSIS — Z0000 Encounter for general adult medical examination without abnormal findings: Secondary | ICD-10-CM | POA: Diagnosis not present

## 2023-05-03 DIAGNOSIS — Z79899 Other long term (current) drug therapy: Secondary | ICD-10-CM | POA: Diagnosis not present

## 2023-05-03 DIAGNOSIS — E039 Hypothyroidism, unspecified: Secondary | ICD-10-CM | POA: Diagnosis not present

## 2023-05-03 DIAGNOSIS — Z299 Encounter for prophylactic measures, unspecified: Secondary | ICD-10-CM | POA: Diagnosis not present

## 2023-05-03 DIAGNOSIS — R5383 Other fatigue: Secondary | ICD-10-CM | POA: Diagnosis not present

## 2023-05-03 DIAGNOSIS — E1122 Type 2 diabetes mellitus with diabetic chronic kidney disease: Secondary | ICD-10-CM | POA: Diagnosis not present

## 2023-05-03 DIAGNOSIS — E7800 Pure hypercholesterolemia, unspecified: Secondary | ICD-10-CM | POA: Diagnosis not present

## 2023-05-09 DIAGNOSIS — J069 Acute upper respiratory infection, unspecified: Secondary | ICD-10-CM | POA: Diagnosis not present

## 2023-05-09 DIAGNOSIS — I2510 Atherosclerotic heart disease of native coronary artery without angina pectoris: Secondary | ICD-10-CM | POA: Diagnosis not present

## 2023-05-09 DIAGNOSIS — N1831 Chronic kidney disease, stage 3a: Secondary | ICD-10-CM | POA: Diagnosis not present

## 2023-05-09 DIAGNOSIS — Z299 Encounter for prophylactic measures, unspecified: Secondary | ICD-10-CM | POA: Diagnosis not present

## 2023-05-11 DIAGNOSIS — H40051 Ocular hypertension, right eye: Secondary | ICD-10-CM | POA: Diagnosis not present

## 2023-05-11 DIAGNOSIS — H35432 Paving stone degeneration of retina, left eye: Secondary | ICD-10-CM | POA: Diagnosis not present

## 2023-05-11 DIAGNOSIS — H43813 Vitreous degeneration, bilateral: Secondary | ICD-10-CM | POA: Diagnosis not present

## 2023-05-11 DIAGNOSIS — H353134 Nonexudative age-related macular degeneration, bilateral, advanced atrophic with subfoveal involvement: Secondary | ICD-10-CM | POA: Diagnosis not present

## 2023-05-17 DIAGNOSIS — H353134 Nonexudative age-related macular degeneration, bilateral, advanced atrophic with subfoveal involvement: Secondary | ICD-10-CM | POA: Diagnosis not present

## 2023-05-25 DIAGNOSIS — Z299 Encounter for prophylactic measures, unspecified: Secondary | ICD-10-CM | POA: Diagnosis not present

## 2023-05-25 DIAGNOSIS — R519 Headache, unspecified: Secondary | ICD-10-CM | POA: Diagnosis not present

## 2023-05-25 DIAGNOSIS — I10 Essential (primary) hypertension: Secondary | ICD-10-CM | POA: Diagnosis not present

## 2023-06-22 DIAGNOSIS — H35432 Paving stone degeneration of retina, left eye: Secondary | ICD-10-CM | POA: Diagnosis not present

## 2023-06-22 DIAGNOSIS — H353134 Nonexudative age-related macular degeneration, bilateral, advanced atrophic with subfoveal involvement: Secondary | ICD-10-CM | POA: Diagnosis not present

## 2023-06-22 DIAGNOSIS — H43813 Vitreous degeneration, bilateral: Secondary | ICD-10-CM | POA: Diagnosis not present

## 2023-06-22 DIAGNOSIS — H40051 Ocular hypertension, right eye: Secondary | ICD-10-CM | POA: Diagnosis not present

## 2023-07-12 DIAGNOSIS — H43813 Vitreous degeneration, bilateral: Secondary | ICD-10-CM | POA: Diagnosis not present

## 2023-07-12 DIAGNOSIS — H35432 Paving stone degeneration of retina, left eye: Secondary | ICD-10-CM | POA: Diagnosis not present

## 2023-07-12 DIAGNOSIS — H353134 Nonexudative age-related macular degeneration, bilateral, advanced atrophic with subfoveal involvement: Secondary | ICD-10-CM | POA: Diagnosis not present

## 2023-07-12 DIAGNOSIS — H40051 Ocular hypertension, right eye: Secondary | ICD-10-CM | POA: Diagnosis not present

## 2023-07-12 DIAGNOSIS — H04123 Dry eye syndrome of bilateral lacrimal glands: Secondary | ICD-10-CM | POA: Diagnosis not present

## 2023-07-12 DIAGNOSIS — H401134 Primary open-angle glaucoma, bilateral, indeterminate stage: Secondary | ICD-10-CM | POA: Diagnosis not present

## 2023-07-13 DIAGNOSIS — H109 Unspecified conjunctivitis: Secondary | ICD-10-CM | POA: Diagnosis not present

## 2023-07-13 DIAGNOSIS — I10 Essential (primary) hypertension: Secondary | ICD-10-CM | POA: Diagnosis not present

## 2023-07-13 DIAGNOSIS — Z299 Encounter for prophylactic measures, unspecified: Secondary | ICD-10-CM | POA: Diagnosis not present

## 2023-07-18 ENCOUNTER — Telehealth: Payer: Self-pay | Admitting: Cardiovascular Disease

## 2023-07-18 NOTE — Telephone Encounter (Signed)
Patient would like to switch from Dr. Excell Seltzer to Dr. Jenene Slicker.  She lives closer to the [place] office. Are you both in agreement with this?

## 2023-07-19 ENCOUNTER — Other Ambulatory Visit: Payer: Self-pay | Admitting: Internal Medicine

## 2023-07-19 DIAGNOSIS — Z1231 Encounter for screening mammogram for malignant neoplasm of breast: Secondary | ICD-10-CM

## 2023-07-20 NOTE — Telephone Encounter (Signed)
This is ok with me too. Thank you. Wish [name] Graham well for me I will miss seeing her.

## 2023-07-26 DIAGNOSIS — N644 Mastodynia: Secondary | ICD-10-CM | POA: Diagnosis not present

## 2023-07-26 DIAGNOSIS — I10 Essential (primary) hypertension: Secondary | ICD-10-CM | POA: Diagnosis not present

## 2023-07-26 DIAGNOSIS — Z299 Encounter for prophylactic measures, unspecified: Secondary | ICD-10-CM | POA: Diagnosis not present

## 2023-08-02 DIAGNOSIS — H43813 Vitreous degeneration, bilateral: Secondary | ICD-10-CM | POA: Diagnosis not present

## 2023-08-02 DIAGNOSIS — H353134 Nonexudative age-related macular degeneration, bilateral, advanced atrophic with subfoveal involvement: Secondary | ICD-10-CM | POA: Diagnosis not present

## 2023-08-02 DIAGNOSIS — H04123 Dry eye syndrome of bilateral lacrimal glands: Secondary | ICD-10-CM | POA: Diagnosis not present

## 2023-08-02 DIAGNOSIS — H401134 Primary open-angle glaucoma, bilateral, indeterminate stage: Secondary | ICD-10-CM | POA: Diagnosis not present

## 2023-08-02 DIAGNOSIS — H35432 Paving stone degeneration of retina, left eye: Secondary | ICD-10-CM | POA: Diagnosis not present

## 2023-08-02 DIAGNOSIS — H40051 Ocular hypertension, right eye: Secondary | ICD-10-CM | POA: Diagnosis not present

## 2023-08-04 ENCOUNTER — Ambulatory Visit
Admission: RE | Admit: 2023-08-04 | Discharge: 2023-08-04 | Disposition: A | Discharge status: Home / Self Care | Payer: Medicare Other | Source: Ambulatory Visit | Attending: Internal Medicine | Admitting: Internal Medicine

## 2023-08-04 DIAGNOSIS — Z1231 Encounter for screening mammogram for malignant neoplasm of breast: Secondary | ICD-10-CM

## 2023-08-10 ENCOUNTER — Other Ambulatory Visit: Payer: Self-pay | Admitting: Internal Medicine

## 2023-08-10 DIAGNOSIS — Z299 Encounter for prophylactic measures, unspecified: Secondary | ICD-10-CM | POA: Diagnosis not present

## 2023-08-10 DIAGNOSIS — R928 Other abnormal and inconclusive findings on diagnostic imaging of breast: Secondary | ICD-10-CM | POA: Diagnosis not present

## 2023-08-10 DIAGNOSIS — I10 Essential (primary) hypertension: Secondary | ICD-10-CM | POA: Diagnosis not present

## 2023-08-23 NOTE — Telephone Encounter (Signed)
Patient scheduled for 09/06/23

## 2023-08-26 DIAGNOSIS — I10 Essential (primary) hypertension: Secondary | ICD-10-CM | POA: Diagnosis not present

## 2023-08-26 DIAGNOSIS — K219 Gastro-esophageal reflux disease without esophagitis: Secondary | ICD-10-CM | POA: Diagnosis not present

## 2023-08-26 DIAGNOSIS — Z23 Encounter for immunization: Secondary | ICD-10-CM | POA: Diagnosis not present

## 2023-08-26 DIAGNOSIS — Z299 Encounter for prophylactic measures, unspecified: Secondary | ICD-10-CM | POA: Diagnosis not present

## 2023-08-26 DIAGNOSIS — I152 Hypertension secondary to endocrine disorders: Secondary | ICD-10-CM | POA: Diagnosis not present

## 2023-08-26 DIAGNOSIS — G319 Degenerative disease of nervous system, unspecified: Secondary | ICD-10-CM | POA: Diagnosis not present

## 2023-08-26 DIAGNOSIS — E1159 Type 2 diabetes mellitus with other circulatory complications: Secondary | ICD-10-CM | POA: Diagnosis not present

## 2023-08-26 DIAGNOSIS — E1122 Type 2 diabetes mellitus with diabetic chronic kidney disease: Secondary | ICD-10-CM | POA: Diagnosis not present

## 2023-08-31 ENCOUNTER — Other Ambulatory Visit: Payer: Medicare Other

## 2023-08-31 DIAGNOSIS — H43813 Vitreous degeneration, bilateral: Secondary | ICD-10-CM | POA: Diagnosis not present

## 2023-08-31 DIAGNOSIS — H35432 Paving stone degeneration of retina, left eye: Secondary | ICD-10-CM | POA: Diagnosis not present

## 2023-08-31 DIAGNOSIS — H04123 Dry eye syndrome of bilateral lacrimal glands: Secondary | ICD-10-CM | POA: Diagnosis not present

## 2023-08-31 DIAGNOSIS — H353134 Nonexudative age-related macular degeneration, bilateral, advanced atrophic with subfoveal involvement: Secondary | ICD-10-CM | POA: Diagnosis not present

## 2023-08-31 DIAGNOSIS — H40051 Ocular hypertension, right eye: Secondary | ICD-10-CM | POA: Diagnosis not present

## 2023-09-06 ENCOUNTER — Ambulatory Visit: Payer: Medicare Other | Admitting: Internal Medicine

## 2023-09-16 ENCOUNTER — Ambulatory Visit: Payer: Medicare Other | Attending: Internal Medicine | Admitting: Cardiovascular Disease

## 2023-09-19 ENCOUNTER — Encounter: Payer: Self-pay | Admitting: Cardiovascular Disease

## 2023-09-23 ENCOUNTER — Ambulatory Visit
Admission: RE | Admit: 2023-09-23 | Discharge: 2023-09-23 | Disposition: A | Discharge status: Home / Self Care | Payer: Medicare Other | Source: Ambulatory Visit | Attending: Internal Medicine | Admitting: Internal Medicine

## 2023-09-23 ENCOUNTER — Ambulatory Visit: Payer: Medicare Other

## 2023-09-23 DIAGNOSIS — R928 Other abnormal and inconclusive findings on diagnostic imaging of breast: Secondary | ICD-10-CM | POA: Diagnosis not present

## 2023-09-28 DIAGNOSIS — H35432 Paving stone degeneration of retina, left eye: Secondary | ICD-10-CM | POA: Diagnosis not present

## 2023-09-28 DIAGNOSIS — H43813 Vitreous degeneration, bilateral: Secondary | ICD-10-CM | POA: Diagnosis not present

## 2023-09-28 DIAGNOSIS — H401134 Primary open-angle glaucoma, bilateral, indeterminate stage: Secondary | ICD-10-CM | POA: Diagnosis not present

## 2023-09-28 DIAGNOSIS — H04123 Dry eye syndrome of bilateral lacrimal glands: Secondary | ICD-10-CM | POA: Diagnosis not present

## 2023-09-28 DIAGNOSIS — H353134 Nonexudative age-related macular degeneration, bilateral, advanced atrophic with subfoveal involvement: Secondary | ICD-10-CM | POA: Diagnosis not present

## 2023-10-19 DIAGNOSIS — R059 Cough, unspecified: Secondary | ICD-10-CM | POA: Diagnosis not present

## 2023-10-19 DIAGNOSIS — U071 COVID-19: Secondary | ICD-10-CM | POA: Diagnosis not present

## 2023-10-19 DIAGNOSIS — Z299 Encounter for prophylactic measures, unspecified: Secondary | ICD-10-CM | POA: Diagnosis not present

## 2023-10-26 DIAGNOSIS — H35432 Paving stone degeneration of retina, left eye: Secondary | ICD-10-CM | POA: Diagnosis not present

## 2023-10-26 DIAGNOSIS — H04123 Dry eye syndrome of bilateral lacrimal glands: Secondary | ICD-10-CM | POA: Diagnosis not present

## 2023-10-26 DIAGNOSIS — H401134 Primary open-angle glaucoma, bilateral, indeterminate stage: Secondary | ICD-10-CM | POA: Diagnosis not present

## 2023-10-26 DIAGNOSIS — H353134 Nonexudative age-related macular degeneration, bilateral, advanced atrophic with subfoveal involvement: Secondary | ICD-10-CM | POA: Diagnosis not present

## 2023-10-26 DIAGNOSIS — H43813 Vitreous degeneration, bilateral: Secondary | ICD-10-CM | POA: Diagnosis not present

## 2023-11-14 ENCOUNTER — Encounter: Payer: Self-pay | Admitting: Cardiovascular Disease

## 2023-11-14 ENCOUNTER — Ambulatory Visit: Payer: Medicare Other | Attending: Cardiovascular Disease | Admitting: Cardiovascular Disease

## 2023-11-14 VITALS — BP 120/70 | HR 81 | Ht 64.0 in | Wt 129.0 lb

## 2023-11-14 DIAGNOSIS — E782 Mixed hyperlipidemia: Secondary | ICD-10-CM | POA: Diagnosis not present

## 2023-11-14 DIAGNOSIS — I10 Essential (primary) hypertension: Secondary | ICD-10-CM

## 2023-11-14 DIAGNOSIS — I25119 Atherosclerotic heart disease of native coronary artery with unspecified angina pectoris: Secondary | ICD-10-CM | POA: Diagnosis not present

## 2023-11-14 MED ORDER — LISINOPRIL 5 MG PO TABS: 5.0000 mg | ORAL_TABLET | Freq: Every day | ORAL | 3 refills | Status: DC

## 2023-11-14 MED ORDER — ROSUVASTATIN CALCIUM 20 MG PO TABS: 20.0000 mg | ORAL_TABLET | Freq: Every day | ORAL | 3 refills | Status: AC

## 2023-11-14 MED ORDER — ISOSORBIDE MONONITRATE ER 120 MG PO TB24: 120.0000 mg | ORAL_TABLET | Freq: Every day | ORAL | 3 refills | Status: DC

## 2023-11-14 MED ORDER — METOPROLOL TARTRATE 50 MG PO TABS: 75.0000 mg | ORAL_TABLET | Freq: Two times a day (BID) | ORAL | 3 refills | Status: AC

## 2023-11-14 MED ORDER — PANTOPRAZOLE SODIUM 40 MG PO TBEC: 40.0000 mg | DELAYED_RELEASE_TABLET | Freq: Every day | ORAL | 3 refills | Status: DC

## 2023-11-14 MED ORDER — NITROGLYCERIN 0.4 MG SL SUBL: 0.4000 mg | SUBLINGUAL_TABLET | SUBLINGUAL | 3 refills | Status: AC | PRN

## 2023-11-14 NOTE — Progress Notes (Signed)
Cardiology Office Note:    Date:  11/14/2023   ID:  [name] Graham, DOB 1934/09/11, MRN [id number]  PCP:  Ignatius Specking, MD   [facility] Providers Cardiologist:  Tonny Bollman, MD     Referring MD: Ignatius Specking, MD   Chief Complaint  Patient presents with   Coronary Artery Disease    History of Present Illness:    [name] Graham is a 89 y.o. female with a hx of coronary artery disease status post CABG in 2009.  The patient is here for follow-up evaluation today.  The patient is noted to have chronic angina and she has managed with metoprolol and high-dose isosorbide.  The patient is here alone today.  She is doing very well.  She remains remarkably active and independent at age 63.  She is still teaching at [employer].  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  She has occasional chest tightness and discomfort with activity.  This resolves quickly with rest.  She reports no change in her symptoms over the past year.  She has not required any sublingual nitroglycerin.  She denies dyspnea, orthopnea, PND, lightheadedness, or syncope.   Current Medications: Current Meds  Medication Sig   aspirin 81 MG tablet Take 81 mg by mouth daily.   Calcium Carbonate-Vit D-Min 600-400 MG-UNIT TABS Take 1 tablet by mouth daily.     hydrochlorothiazide (MICROZIDE) 12.5 MG capsule Take 1 capsule (12.5 mg total) by mouth daily as needed (swelling).   isosorbide mononitrate (IMDUR) 120 MG 24 hr tablet Take 1 tablet (120 mg total) by mouth daily.   latanoprost (XALATAN) 0.005 % ophthalmic solution SMARTSIG:In Eye(s)   lisinopril (ZESTRIL) 5 MG tablet Take 1 tablet (5 mg total) by mouth daily.   meclizine (ANTIVERT) 12.5 MG tablet Take 12.5 mg by mouth 3 (three) times daily as needed.   metFORMIN (GLUCOPHAGE) 500 MG tablet Take 500 mg by mouth daily.   metoprolol tartrate (LOPRESSOR) 50 MG tablet Take 1.5 tablets (75 mg total)  by mouth 2 (two) times daily.   Multiple Vitamins-Minerals (MULTIVITAMIN WITH MINERALS) tablet Take 1 tablet by mouth daily.     naproxen (NAPROSYN) 500 MG tablet Take 1 tablet (500 mg total) by mouth 2 (two) times daily with a meal.   nitroGLYCERIN (NITROSTAT) 0.4 MG SL tablet Place 1 tablet (0.4 mg total) under the tongue every 5 (five) minutes as needed for chest pain.   omeprazole (PRILOSEC) 40 MG capsule Take 40 mg by mouth daily.   ondansetron (ZOFRAN-ODT) 4 MG disintegrating tablet Take 4 mg by mouth 3 (three) times daily as needed.   pantoprazole (PROTONIX) 40 MG tablet TAKE 1 TABLET BY MOUTH DAILY BEFORE BREAKFAST   potassium chloride (KLOR-CON) 10 MEQ tablet Take by mouth.   rosuvastatin (CRESTOR) 20 MG tablet Take 1 tablet (20 mg total) by mouth daily.   XIIDRA 5 % SOLN Apply 1 drop to eye 2 (two) times daily.   [DISCONTINUED] HYDROcodone-acetaminophen (NORCO/VICODIN) 5-325 MG tablet One tablet every four hours as needed for acute pain.  Limit of five days per [place].     Allergies:   Patient has no known allergies.   ROS:   Please see the history of present illness.    All other systems reviewed and are negative.  EKGs/Labs/Other Studies Reviewed:    The following studies were reviewed today: Cardiac Studies & Procedures   CARDIAC  CATHETERIZATION  CARDIAC CATHETERIZATION 06/12/2015  Narrative 1. Prox RCA lesion, 30% stenosed. 2. Prox LAD lesion, 50% stenosed. 3. was injected is small. 4. There is mild disease in the graft. 5. There is mild diffuse vein graft irregularity unchanged from the previous study 6. The left ventricular systolic function is normal.  FINAL CONCLUSIONS:  MILD-MODERATE DIFFUSE PROXIMAL AND MID-LAD STENOSIS  WIDELY PATENT LCX  PATENT RCA, INITIALLY WITH PROXIMAL VASOSPASM RESOLVED WITH IC NTG  VIGOROUS/NORMAL LV FUNCTION  STABLE/IMPROVED FINDINGS FROM PRIOR CATH STUDIES  RECOMMEND CONTINUED MEDICAL MANAGEMENT  Findings Coronary  Findings Diagnostic  Dominance: Right  Left Anterior Descending calcified diffuse .  Compared to the previous catheterization studies, this appears less significantly diseased. There may have been a component of vasospasm on previous studies.  Right Coronary Artery Initially there is catheter induced vasospasm of the proximal right coronary artery. Intracoronary nitroglycerin was administered and this completely resolved. There is mild nonobstructive proximal plaquing.  Graft To 1st Diag was injected is small. There is mild disease in the graft. There is mild diffuse vein graft irregularity unchanged from the previous study  Intervention  No interventions have been documented.   STRESS TESTS  MYOCARDIAL PERFUSION IMAGING 03/16/2022  Narrative   The study is normal. The study is low risk.   No ST deviation was noted.   LV perfusion is normal.   Left ventricular function is normal. Nuclear stress EF: 80 %. The left ventricular ejection fraction is hyperdynamic (>65%). End diastolic cavity size is normal.   Prior study available for comparison. No changes compared to prior study.  Low risk stress nuclear study with normal perfusion and normal left ventricular regional and global systolic function.              EKG:   EKG Interpretation Date/Time:  Monday November 14 2023 11:47:57 EST Ventricular Rate:  81 PR Interval:  190 QRS Duration:  126 QT Interval:  430 QTC Calculation: 499 R Axis:   75  Text Interpretation: Normal sinus rhythm Non-specific intra-ventricular conduction block When compared with ECG of 05-Nov-2008 07:32, Questionable change in QRS axis Nonspecific T wave abnormality no longer evident in Lateral leads Confirmed by Tonny Bollman [phone]) on 11/14/2023 12:02:58 PM    Recent Labs: No results found for requested labs within last 365 days.  Recent Lipid Panel    Component Value Date/Time   CHOL 223 (H) 03/16/2022 1053   TRIG 217 (H) 03/16/2022 1053   HDL  43 03/16/2022 1053   CHOLHDL 5.2 (H) 03/16/2022 1053   CHOLHDL 6 12/19/2014 0941   VLDL 53.8 (H) 12/19/2014 0941   LDLCALC 141 (H) 03/16/2022 1053   LDLDIRECT 109.0 12/19/2014 0941     Risk Assessment/Calculations:                Physical Exam:    VS:  BP 120/70   Pulse 81   Ht 5\' 4"  (1.626 m)   Wt 129 lb (58.5 kg)   SpO2 99%   BMI 22.14 kg/m     Wt Readings from Last 3 Encounters:  11/14/23 129 lb (58.5 kg)  12/23/22 127 lb (57.6 kg)  07/13/22 125 lb (56.7 kg)     GEN:  Well nourished, well developed in no acute distress HEENT: Normal NECK: No JVD; No carotid bruits LYMPHATICS: No lymphadenopathy CARDIAC: RRR, no murmurs, rubs, gallops RESPIRATORY:  Clear to auscultation without rales, wheezing or rhonchi  ABDOMEN: Soft, non-tender, non-distended MUSCULOSKELETAL:  No edema; No deformity  SKIN:  Warm and dry NEUROLOGIC:  Alert and oriented x 3 PSYCHIATRIC:  Normal affect   Assessment & Plan ESSENTIAL HYPERTENSION, BENIGN Blood pressure is well-controlled at 120/70 on a combination of hydrochlorothiazide, lisinopril, and metoprolol.  Continue same medications. Coronary artery disease involving native coronary artery of native heart with angina pectoris [facility]) The patient continues to have mild angina with certain activities.  This is unchanged over time.  She has not required any sublingual nitroglycerin.  It resolves quickly with rest.  Continue metoprolol and isosorbide at current doses.  Continue aspirin for antiplatelet therapy as well as a high intensity statin drug. Essential hypertension As above Mixed hyperlipidemia Treated with rosuvastatin.  Lipids are followed by her PCP.  Goal LDL cholesterol less than 70.      Medication Adjustments/Labs and Tests Ordered: Current medicines are reviewed at length with the patient today.  Concerns regarding medicines are outlined above.  Orders Placed This Encounter  Procedures   EKG 12-Lead   No orders of the  defined types were placed in this encounter.   There are no Patient Instructions on file for this visit.   Signed, Tonny Bollman, MD  11/14/2023 12:15 PM    [facility]

## 2023-11-14 NOTE — Assessment & Plan Note (Signed)
Blood pressure is well-controlled at 120/70 on a combination of hydrochlorothiazide, lisinopril, and metoprolol.  Continue same medications.

## 2023-11-14 NOTE — Patient Instructions (Signed)
 Follow-Up: At [facility], you and your health needs are our priority.  As part of our continuing mission to provide you with exceptional heart care, we have created designated Provider Care Teams.  These Care Teams include your primary Cardiologist (physician) and Advanced Practice Providers (APPs -  Physician Assistants and Nurse Practitioners) who all work together to provide you with the care you need, when you need it.  We recommend signing up for the patient portal called "MyChart".  Sign up information is provided on this After Visit Summary.  MyChart is used to connect with patients for Virtual Visits (Telemedicine).  Patients are able to view lab/test results, encounter notes, upcoming appointments, etc.  Non-urgent messages can be sent to your provider as well.   To learn more about what you can do with MyChart, go to [email or web address].    Your next appointment:   6 month(s)  Provider:   Tonny Bollman, MD

## 2023-11-16 DIAGNOSIS — I739 Peripheral vascular disease, unspecified: Secondary | ICD-10-CM | POA: Diagnosis not present

## 2023-11-16 DIAGNOSIS — J069 Acute upper respiratory infection, unspecified: Secondary | ICD-10-CM | POA: Diagnosis not present

## 2023-11-16 DIAGNOSIS — D692 Other nonthrombocytopenic purpura: Secondary | ICD-10-CM | POA: Diagnosis not present

## 2023-11-16 DIAGNOSIS — Z299 Encounter for prophylactic measures, unspecified: Secondary | ICD-10-CM | POA: Diagnosis not present

## 2023-11-16 DIAGNOSIS — I25119 Atherosclerotic heart disease of native coronary artery with unspecified angina pectoris: Secondary | ICD-10-CM | POA: Diagnosis not present

## 2023-11-23 DIAGNOSIS — H04123 Dry eye syndrome of bilateral lacrimal glands: Secondary | ICD-10-CM | POA: Diagnosis not present

## 2023-11-23 DIAGNOSIS — H43813 Vitreous degeneration, bilateral: Secondary | ICD-10-CM | POA: Diagnosis not present

## 2023-11-23 DIAGNOSIS — H401134 Primary open-angle glaucoma, bilateral, indeterminate stage: Secondary | ICD-10-CM | POA: Diagnosis not present

## 2023-11-23 DIAGNOSIS — H353134 Nonexudative age-related macular degeneration, bilateral, advanced atrophic with subfoveal involvement: Secondary | ICD-10-CM | POA: Diagnosis not present

## 2023-11-23 DIAGNOSIS — H35432 Paving stone degeneration of retina, left eye: Secondary | ICD-10-CM | POA: Diagnosis not present

## 2023-12-16 ENCOUNTER — Ambulatory Visit: Payer: Medicare Other | Admitting: Physician Assistant

## 2023-12-21 DIAGNOSIS — H04123 Dry eye syndrome of bilateral lacrimal glands: Secondary | ICD-10-CM | POA: Diagnosis not present

## 2023-12-21 DIAGNOSIS — H353134 Nonexudative age-related macular degeneration, bilateral, advanced atrophic with subfoveal involvement: Secondary | ICD-10-CM | POA: Diagnosis not present

## 2023-12-21 DIAGNOSIS — H43813 Vitreous degeneration, bilateral: Secondary | ICD-10-CM | POA: Diagnosis not present

## 2023-12-21 DIAGNOSIS — H401134 Primary open-angle glaucoma, bilateral, indeterminate stage: Secondary | ICD-10-CM | POA: Diagnosis not present

## 2023-12-21 DIAGNOSIS — H35432 Paving stone degeneration of retina, left eye: Secondary | ICD-10-CM | POA: Diagnosis not present

## 2024-01-18 DIAGNOSIS — H04123 Dry eye syndrome of bilateral lacrimal glands: Secondary | ICD-10-CM | POA: Diagnosis not present

## 2024-01-18 DIAGNOSIS — H43813 Vitreous degeneration, bilateral: Secondary | ICD-10-CM | POA: Diagnosis not present

## 2024-01-18 DIAGNOSIS — H401134 Primary open-angle glaucoma, bilateral, indeterminate stage: Secondary | ICD-10-CM | POA: Diagnosis not present

## 2024-01-18 DIAGNOSIS — H353134 Nonexudative age-related macular degeneration, bilateral, advanced atrophic with subfoveal involvement: Secondary | ICD-10-CM | POA: Diagnosis not present

## 2024-01-18 DIAGNOSIS — H35432 Paving stone degeneration of retina, left eye: Secondary | ICD-10-CM | POA: Diagnosis not present

## 2024-02-15 DIAGNOSIS — H04123 Dry eye syndrome of bilateral lacrimal glands: Secondary | ICD-10-CM | POA: Diagnosis not present

## 2024-02-15 DIAGNOSIS — H43813 Vitreous degeneration, bilateral: Secondary | ICD-10-CM | POA: Diagnosis not present

## 2024-02-15 DIAGNOSIS — H35432 Paving stone degeneration of retina, left eye: Secondary | ICD-10-CM | POA: Diagnosis not present

## 2024-02-15 DIAGNOSIS — H401134 Primary open-angle glaucoma, bilateral, indeterminate stage: Secondary | ICD-10-CM | POA: Diagnosis not present

## 2024-02-15 DIAGNOSIS — H353134 Nonexudative age-related macular degeneration, bilateral, advanced atrophic with subfoveal involvement: Secondary | ICD-10-CM | POA: Diagnosis not present

## 2024-03-14 DIAGNOSIS — H04123 Dry eye syndrome of bilateral lacrimal glands: Secondary | ICD-10-CM | POA: Diagnosis not present

## 2024-03-14 DIAGNOSIS — H35432 Paving stone degeneration of retina, left eye: Secondary | ICD-10-CM | POA: Diagnosis not present

## 2024-03-14 DIAGNOSIS — H401134 Primary open-angle glaucoma, bilateral, indeterminate stage: Secondary | ICD-10-CM | POA: Diagnosis not present

## 2024-03-14 DIAGNOSIS — H353134 Nonexudative age-related macular degeneration, bilateral, advanced atrophic with subfoveal involvement: Secondary | ICD-10-CM | POA: Diagnosis not present

## 2024-03-14 DIAGNOSIS — H43813 Vitreous degeneration, bilateral: Secondary | ICD-10-CM | POA: Diagnosis not present

## 2024-03-30 DIAGNOSIS — N183 Chronic kidney disease, stage 3 unspecified: Secondary | ICD-10-CM | POA: Diagnosis not present

## 2024-03-30 DIAGNOSIS — E1122 Type 2 diabetes mellitus with diabetic chronic kidney disease: Secondary | ICD-10-CM | POA: Diagnosis not present

## 2024-03-30 DIAGNOSIS — I10 Essential (primary) hypertension: Secondary | ICD-10-CM | POA: Diagnosis not present

## 2024-03-30 DIAGNOSIS — R5383 Other fatigue: Secondary | ICD-10-CM | POA: Diagnosis not present

## 2024-03-30 DIAGNOSIS — Z299 Encounter for prophylactic measures, unspecified: Secondary | ICD-10-CM | POA: Diagnosis not present

## 2024-03-30 DIAGNOSIS — Z79899 Other long term (current) drug therapy: Secondary | ICD-10-CM | POA: Diagnosis not present

## 2024-04-11 DIAGNOSIS — H353134 Nonexudative age-related macular degeneration, bilateral, advanced atrophic with subfoveal involvement: Secondary | ICD-10-CM | POA: Diagnosis not present

## 2024-04-11 DIAGNOSIS — H04123 Dry eye syndrome of bilateral lacrimal glands: Secondary | ICD-10-CM | POA: Diagnosis not present

## 2024-04-11 DIAGNOSIS — H401134 Primary open-angle glaucoma, bilateral, indeterminate stage: Secondary | ICD-10-CM | POA: Diagnosis not present

## 2024-04-11 DIAGNOSIS — H35432 Paving stone degeneration of retina, left eye: Secondary | ICD-10-CM | POA: Diagnosis not present

## 2024-04-11 DIAGNOSIS — H43813 Vitreous degeneration, bilateral: Secondary | ICD-10-CM | POA: Diagnosis not present

## 2024-05-02 DIAGNOSIS — J320 Chronic maxillary sinusitis: Secondary | ICD-10-CM | POA: Diagnosis not present

## 2024-05-02 DIAGNOSIS — I2510 Atherosclerotic heart disease of native coronary artery without angina pectoris: Secondary | ICD-10-CM | POA: Diagnosis not present

## 2024-05-02 DIAGNOSIS — J069 Acute upper respiratory infection, unspecified: Secondary | ICD-10-CM | POA: Diagnosis not present

## 2024-05-02 DIAGNOSIS — N183 Chronic kidney disease, stage 3 unspecified: Secondary | ICD-10-CM | POA: Diagnosis not present

## 2024-05-02 DIAGNOSIS — Z299 Encounter for prophylactic measures, unspecified: Secondary | ICD-10-CM | POA: Diagnosis not present

## 2024-05-09 DIAGNOSIS — H353134 Nonexudative age-related macular degeneration, bilateral, advanced atrophic with subfoveal involvement: Secondary | ICD-10-CM | POA: Diagnosis not present

## 2024-05-10 DIAGNOSIS — H6121 Impacted cerumen, right ear: Secondary | ICD-10-CM | POA: Diagnosis not present

## 2024-05-10 DIAGNOSIS — I10 Essential (primary) hypertension: Secondary | ICD-10-CM | POA: Diagnosis not present

## 2024-05-10 DIAGNOSIS — Z299 Encounter for prophylactic measures, unspecified: Secondary | ICD-10-CM | POA: Diagnosis not present

## 2024-05-15 DIAGNOSIS — Z299 Encounter for prophylactic measures, unspecified: Secondary | ICD-10-CM | POA: Diagnosis not present

## 2024-05-15 DIAGNOSIS — H9202 Otalgia, left ear: Secondary | ICD-10-CM | POA: Diagnosis not present

## 2024-05-15 DIAGNOSIS — I10 Essential (primary) hypertension: Secondary | ICD-10-CM | POA: Diagnosis not present

## 2024-05-18 DIAGNOSIS — E039 Hypothyroidism, unspecified: Secondary | ICD-10-CM | POA: Diagnosis not present

## 2024-05-18 DIAGNOSIS — Z299 Encounter for prophylactic measures, unspecified: Secondary | ICD-10-CM | POA: Diagnosis not present

## 2024-05-18 DIAGNOSIS — Z1389 Encounter for screening for other disorder: Secondary | ICD-10-CM | POA: Diagnosis not present

## 2024-05-18 DIAGNOSIS — I739 Peripheral vascular disease, unspecified: Secondary | ICD-10-CM | POA: Diagnosis not present

## 2024-05-18 DIAGNOSIS — Z0000 Encounter for general adult medical examination without abnormal findings: Secondary | ICD-10-CM | POA: Diagnosis not present

## 2024-05-18 DIAGNOSIS — Z7189 Other specified counseling: Secondary | ICD-10-CM | POA: Diagnosis not present

## 2024-05-18 DIAGNOSIS — I10 Essential (primary) hypertension: Secondary | ICD-10-CM | POA: Diagnosis not present

## 2024-06-06 DIAGNOSIS — H04123 Dry eye syndrome of bilateral lacrimal glands: Secondary | ICD-10-CM | POA: Diagnosis not present

## 2024-06-06 DIAGNOSIS — H43813 Vitreous degeneration, bilateral: Secondary | ICD-10-CM | POA: Diagnosis not present

## 2024-06-06 DIAGNOSIS — H40051 Ocular hypertension, right eye: Secondary | ICD-10-CM | POA: Diagnosis not present

## 2024-06-06 DIAGNOSIS — H35432 Paving stone degeneration of retina, left eye: Secondary | ICD-10-CM | POA: Diagnosis not present

## 2024-06-06 DIAGNOSIS — H353134 Nonexudative age-related macular degeneration, bilateral, advanced atrophic with subfoveal involvement: Secondary | ICD-10-CM | POA: Diagnosis not present

## 2024-06-13 DIAGNOSIS — L570 Actinic keratosis: Secondary | ICD-10-CM | POA: Diagnosis not present

## 2024-06-13 DIAGNOSIS — I10 Essential (primary) hypertension: Secondary | ICD-10-CM | POA: Diagnosis not present

## 2024-06-13 DIAGNOSIS — Z299 Encounter for prophylactic measures, unspecified: Secondary | ICD-10-CM | POA: Diagnosis not present

## 2024-07-06 DIAGNOSIS — H353134 Nonexudative age-related macular degeneration, bilateral, advanced atrophic with subfoveal involvement: Secondary | ICD-10-CM | POA: Diagnosis not present

## 2024-08-02 DIAGNOSIS — I10 Essential (primary) hypertension: Secondary | ICD-10-CM | POA: Diagnosis not present

## 2024-08-02 DIAGNOSIS — E119 Type 2 diabetes mellitus without complications: Secondary | ICD-10-CM | POA: Diagnosis not present

## 2024-08-02 DIAGNOSIS — Z299 Encounter for prophylactic measures, unspecified: Secondary | ICD-10-CM | POA: Diagnosis not present

## 2024-08-02 DIAGNOSIS — N1831 Chronic kidney disease, stage 3a: Secondary | ICD-10-CM | POA: Diagnosis not present

## 2024-08-02 DIAGNOSIS — R300 Dysuria: Secondary | ICD-10-CM | POA: Diagnosis not present

## 2024-08-08 DIAGNOSIS — H40051 Ocular hypertension, right eye: Secondary | ICD-10-CM | POA: Diagnosis not present

## 2024-08-08 DIAGNOSIS — H35432 Paving stone degeneration of retina, left eye: Secondary | ICD-10-CM | POA: Diagnosis not present

## 2024-08-08 DIAGNOSIS — H353134 Nonexudative age-related macular degeneration, bilateral, advanced atrophic with subfoveal involvement: Secondary | ICD-10-CM | POA: Diagnosis not present

## 2024-08-08 DIAGNOSIS — H04123 Dry eye syndrome of bilateral lacrimal glands: Secondary | ICD-10-CM | POA: Diagnosis not present

## 2024-08-08 DIAGNOSIS — H43813 Vitreous degeneration, bilateral: Secondary | ICD-10-CM | POA: Diagnosis not present

## 2024-08-23 DIAGNOSIS — I25119 Atherosclerotic heart disease of native coronary artery with unspecified angina pectoris: Secondary | ICD-10-CM | POA: Diagnosis not present

## 2024-08-23 DIAGNOSIS — Z23 Encounter for immunization: Secondary | ICD-10-CM | POA: Diagnosis not present

## 2024-08-23 DIAGNOSIS — R300 Dysuria: Secondary | ICD-10-CM | POA: Diagnosis not present

## 2024-08-23 DIAGNOSIS — I10 Essential (primary) hypertension: Secondary | ICD-10-CM | POA: Diagnosis not present

## 2024-08-23 DIAGNOSIS — Z299 Encounter for prophylactic measures, unspecified: Secondary | ICD-10-CM | POA: Diagnosis not present

## 2024-08-23 DIAGNOSIS — Z0000 Encounter for general adult medical examination without abnormal findings: Secondary | ICD-10-CM | POA: Diagnosis not present

## 2024-09-02 ENCOUNTER — Emergency Department (HOSPITAL_COMMUNITY)

## 2024-09-02 ENCOUNTER — Observation Stay (HOSPITAL_COMMUNITY)
Admission: EM | Admit: 2024-09-02 | Discharge: 2024-09-03 | Disposition: A | Discharge status: Home / Self Care | Attending: Internal Medicine | Admitting: Internal Medicine

## 2024-09-02 ENCOUNTER — Inpatient Hospital Stay (HOSPITAL_COMMUNITY)

## 2024-09-02 ENCOUNTER — Other Ambulatory Visit: Payer: Self-pay

## 2024-09-02 ENCOUNTER — Encounter (HOSPITAL_COMMUNITY): Payer: Self-pay | Admitting: *Deleted

## 2024-09-02 DIAGNOSIS — I10 Essential (primary) hypertension: Secondary | ICD-10-CM | POA: Insufficient documentation

## 2024-09-02 DIAGNOSIS — J209 Acute bronchitis, unspecified: Principal | ICD-10-CM | POA: Insufficient documentation

## 2024-09-02 DIAGNOSIS — R918 Other nonspecific abnormal finding of lung field: Secondary | ICD-10-CM | POA: Diagnosis not present

## 2024-09-02 DIAGNOSIS — I25119 Atherosclerotic heart disease of native coronary artery with unspecified angina pectoris: Secondary | ICD-10-CM

## 2024-09-02 DIAGNOSIS — I509 Heart failure, unspecified: Secondary | ICD-10-CM | POA: Insufficient documentation

## 2024-09-02 DIAGNOSIS — Z7984 Long term (current) use of oral hypoglycemic drugs: Secondary | ICD-10-CM | POA: Insufficient documentation

## 2024-09-02 DIAGNOSIS — I2510 Atherosclerotic heart disease of native coronary artery without angina pectoris: Secondary | ICD-10-CM | POA: Diagnosis not present

## 2024-09-02 DIAGNOSIS — E785 Hyperlipidemia, unspecified: Secondary | ICD-10-CM | POA: Insufficient documentation

## 2024-09-02 DIAGNOSIS — I700 Atherosclerosis of aorta: Secondary | ICD-10-CM | POA: Diagnosis not present

## 2024-09-02 DIAGNOSIS — R0602 Shortness of breath: Secondary | ICD-10-CM | POA: Diagnosis not present

## 2024-09-02 DIAGNOSIS — J40 Bronchitis, not specified as acute or chronic: Secondary | ICD-10-CM

## 2024-09-02 DIAGNOSIS — Z7982 Long term (current) use of aspirin: Secondary | ICD-10-CM | POA: Insufficient documentation

## 2024-09-02 DIAGNOSIS — E1169 Type 2 diabetes mellitus with other specified complication: Secondary | ICD-10-CM | POA: Diagnosis not present

## 2024-09-02 LAB — GLUCOSE, CAPILLARY: Glucose-Capillary: 205 mg/dL — ABNORMAL HIGH (ref 70–99)

## 2024-09-02 LAB — CBC
HCT: 37.1 % (ref 36.0–46.0)
Hemoglobin: 13.1 g/dL (ref 12.0–15.0)
MCH: 31.6 pg (ref 26.0–34.0)
MCHC: 35.3 g/dL (ref 30.0–36.0)
MCV: 89.4 fL (ref 80.0–100.0)
Platelets: 157 K/uL (ref 150–400)
RBC: 4.15 MIL/uL (ref 3.87–5.11)
RDW: 13.3 % (ref 11.5–15.5)
WBC: 7.5 K/uL (ref 4.0–10.5)
nRBC: 0 % (ref 0.0–0.2)

## 2024-09-02 LAB — BASIC METABOLIC PANEL WITH GFR
Anion gap: 12 (ref 5–15)
BUN: 24 mg/dL — ABNORMAL HIGH (ref 8–23)
CO2: 23 mmol/L (ref 22–32)
Calcium: 9.1 mg/dL (ref 8.9–10.3)
Chloride: 102 mmol/L (ref 98–111)
Creatinine, Ser: 0.84 mg/dL (ref 0.44–1.00)
GFR, Estimated: >60 mL/min (ref >60)
Glucose, Bld: 165 mg/dL — ABNORMAL HIGH (ref 70–99)
Potassium: 4.3 mmol/L (ref 3.5–5.1)
Sodium: 137 mmol/L (ref 135–145)

## 2024-09-02 LAB — BRAIN NATRIURETIC PEPTIDE: B Natriuretic Peptide: 126 pg/mL — ABNORMAL HIGH (ref 0.0–100.0)

## 2024-09-02 LAB — RESP PANEL BY RT-PCR (RSV, FLU A&B, COVID)  RVPGX2
Influenza A by PCR: NEGATIVE
Influenza B by PCR: NEGATIVE
Resp Syncytial Virus by PCR: NEGATIVE
SARS Coronavirus 2 by RT PCR: NEGATIVE

## 2024-09-02 LAB — D-DIMER, QUANTITATIVE: D-Dimer, Quant: 0.68 ug{FEU}/mL — ABNORMAL HIGH (ref 0.00–0.50)

## 2024-09-02 LAB — TROPONIN I (HIGH SENSITIVITY)
Troponin I (High Sensitivity): 3 ng/L (ref <18)
Troponin I (High Sensitivity): 4 ng/L (ref <18)

## 2024-09-02 MED ORDER — ASPIRIN 81 MG PO TBEC
81.0000 mg | DELAYED_RELEASE_TABLET | Freq: Every day | ORAL | Status: DC
  Administered 2024-09-03: 81 mg via ORAL
  Filled 2024-09-02: qty 1

## 2024-09-02 MED ORDER — LISINOPRIL 10 MG PO TABS
10.0000 mg | ORAL_TABLET | Freq: Every day | ORAL | Status: DC
  Administered 2024-09-03: 10 mg via ORAL
  Filled 2024-09-02: qty 1

## 2024-09-02 MED ORDER — MECLIZINE HCL 12.5 MG PO TABS: 12.5000 mg | ORAL_TABLET | Freq: Three times a day (TID) | ORAL | Status: DC | PRN

## 2024-09-02 MED ORDER — METOPROLOL TARTRATE 50 MG PO TABS
75.0000 mg | ORAL_TABLET | Freq: Two times a day (BID) | ORAL | Status: DC
  Administered 2024-09-03: 75 mg via ORAL
  Filled 2024-09-02: qty 1

## 2024-09-02 MED ORDER — FLUTICASONE FUROATE-VILANTEROL 200-25 MCG/ACT IN AEPB
1.0000 | INHALATION_SPRAY | Freq: Every day | RESPIRATORY_TRACT | Status: DC
  Administered 2024-09-03: 1 via RESPIRATORY_TRACT
  Filled 2024-09-02: qty 28

## 2024-09-02 MED ORDER — PANTOPRAZOLE SODIUM 40 MG PO TBEC
40.0000 mg | DELAYED_RELEASE_TABLET | Freq: Every day | ORAL | Status: DC
  Administered 2024-09-03: 40 mg via ORAL
  Filled 2024-09-02: qty 1

## 2024-09-02 MED ORDER — ALBUTEROL SULFATE (2.5 MG/3ML) 0.083% IN NEBU: 2.5000 mg | INHALATION_SOLUTION | RESPIRATORY_TRACT | Status: DC | PRN

## 2024-09-02 MED ORDER — NITROGLYCERIN 0.4 MG SL SUBL: 0.4000 mg | SUBLINGUAL_TABLET | SUBLINGUAL | Status: DC | PRN

## 2024-09-02 MED ORDER — ACETAMINOPHEN 325 MG PO TABS: 650.0000 mg | ORAL_TABLET | Freq: Four times a day (QID) | ORAL | Status: DC | PRN

## 2024-09-02 MED ORDER — HYDROCHLOROTHIAZIDE 12.5 MG PO TABS: 12.5000 mg | ORAL_TABLET | Freq: Every day | ORAL | Status: DC | PRN

## 2024-09-02 MED ORDER — ONDANSETRON HCL 4 MG/2ML IJ SOLN: 4.0000 mg | Freq: Four times a day (QID) | INTRAMUSCULAR | Status: DC | PRN

## 2024-09-02 MED ORDER — OYSTER SHELL CALCIUM/D3 500-5 MG-MCG PO TABS
1.0000 | ORAL_TABLET | Freq: Every day | ORAL | Status: DC
  Administered 2024-09-03: 1 via ORAL
  Filled 2024-09-02: qty 1

## 2024-09-02 MED ORDER — METHYLPREDNISOLONE SODIUM SUCC 40 MG IJ SOLR
40.0000 mg | INTRAMUSCULAR | Status: DC
Start: 2024-09-02 — End: 2024-09-03
  Administered 2024-09-02: 40 mg via INTRAVENOUS
  Filled 2024-09-02: qty 1

## 2024-09-02 MED ORDER — ONDANSETRON HCL 4 MG PO TABS: 4.0000 mg | ORAL_TABLET | Freq: Four times a day (QID) | ORAL | Status: DC | PRN

## 2024-09-02 MED ORDER — ADULT MULTIVITAMIN W/MINERALS CH
1.0000 | ORAL_TABLET | Freq: Every day | ORAL | Status: DC
  Administered 2024-09-03: 1 via ORAL
  Filled 2024-09-02: qty 1

## 2024-09-02 MED ORDER — ACETAMINOPHEN 650 MG RE SUPP: 650.0000 mg | Freq: Four times a day (QID) | RECTAL | Status: DC | PRN

## 2024-09-02 MED ORDER — ROSUVASTATIN CALCIUM 20 MG PO TABS
20.0000 mg | ORAL_TABLET | Freq: Every day | ORAL | Status: DC
  Administered 2024-09-03: 20 mg via ORAL
  Filled 2024-09-02: qty 1

## 2024-09-02 MED ORDER — IOHEXOL 350 MG/ML SOLN
75.0000 mL | Freq: Once | INTRAVENOUS | Status: AC | PRN
  Administered 2024-09-02: 75 mL via INTRAVENOUS

## 2024-09-02 MED ORDER — ENOXAPARIN SODIUM 40 MG/0.4ML IJ SOSY
40.0000 mg | PREFILLED_SYRINGE | INTRAMUSCULAR | Status: DC
  Administered 2024-09-02: 40 mg via SUBCUTANEOUS
  Filled 2024-09-02: qty 0.4

## 2024-09-02 MED ORDER — ISOSORBIDE MONONITRATE ER 60 MG PO TB24
120.0000 mg | ORAL_TABLET | Freq: Every day | ORAL | Status: DC
Start: 2024-09-02 — End: 2024-09-03

## 2024-09-02 MED ORDER — FUROSEMIDE 10 MG/ML IJ SOLN
20.0000 mg | Freq: Once | INTRAMUSCULAR | Status: AC
  Administered 2024-09-02: 20 mg via INTRAVENOUS
  Filled 2024-09-02: qty 2

## 2024-09-02 MED ORDER — POTASSIUM CHLORIDE ER 10 MEQ PO TBCR
10.0000 meq | EXTENDED_RELEASE_TABLET | Freq: Every day | ORAL | Status: DC
  Administered 2024-09-03: 10 meq via ORAL
  Filled 2024-09-02 (×4): qty 1

## 2024-09-02 MED ORDER — MELATONIN 3 MG PO TABS
3.0000 mg | ORAL_TABLET | Freq: Every day | ORAL | Status: DC
  Administered 2024-09-02: 3 mg via ORAL
  Filled 2024-09-02: qty 1

## 2024-09-02 MED ORDER — IPRATROPIUM-ALBUTEROL 0.5-2.5 (3) MG/3ML IN SOLN
3.0000 mL | Freq: Four times a day (QID) | RESPIRATORY_TRACT | Status: DC
  Administered 2024-09-02: 3 mL via RESPIRATORY_TRACT
  Filled 2024-09-02: qty 3

## 2024-09-02 MED ORDER — IPRATROPIUM-ALBUTEROL 0.5-2.5 (3) MG/3ML IN SOLN
3.0000 mL | Freq: Three times a day (TID) | RESPIRATORY_TRACT | Status: DC
  Administered 2024-09-03: 3 mL via RESPIRATORY_TRACT
  Filled 2024-09-02: qty 3

## 2024-09-02 MED ORDER — IPRATROPIUM-ALBUTEROL 0.5-2.5 (3) MG/3ML IN SOLN
3.0000 mL | Freq: Once | RESPIRATORY_TRACT | Status: AC
  Administered 2024-09-02: 3 mL via RESPIRATORY_TRACT
  Filled 2024-09-02: qty 3

## 2024-09-02 NOTE — Assessment & Plan Note (Signed)
 No frank angina, high sensitive troponin negative Continue aspirin  and statin Isosorbide , metoprolol  and lisinopril .  Further work up with echocardiogram.

## 2024-09-02 NOTE — ED Triage Notes (Signed)
 Pt with SOB for several days. Pt states I feel like I need a breathing treatment.

## 2024-09-02 NOTE — ED Notes (Signed)
 Patient transported to X-ray

## 2024-09-02 NOTE — Assessment & Plan Note (Signed)
 Worsening dyspnea on exertion, on physical examination with not much volume overload but signs of air trapping.   Plan for bronchodilator therapy, inhaled corticosteroids and systemic steroids. Further work up with CT chest ruled out PE, considering high d dimer. Continue oxymetry monitoring.

## 2024-09-02 NOTE — ED Notes (Signed)
 ED TO INPATIENT HANDOFF REPORT  ED Nurse Name and Phone #: Sherlean   S Name/Age/Gender [name] Graham 89 y.o. female Room/Bed: APA01/APA01  Code Status   Code Status: Full Code  Home/SNF/Other Home Patient oriented to: self, place, time, and situation Is this baseline? Yes   Triage Complete: Triage complete  Chief Complaint Heart failure (HCC) [I50.9]  Triage Note Pt with SOB for several days. Pt states I feel like I need a breathing treatment.   Allergies No Known Allergies  Level of Care/Admitting Diagnosis ED Disposition     ED Disposition  Admit   Condition  --   Comment  Hospital Area: [facility] [[id number]]  Level of Care: Telemetry [5]  Covid Evaluation: Asymptomatic - no recent exposure (last 10 days) testing not required  Diagnosis: Heart failure [facility]) [[id number]]  Admitting Physician: ARRIEN, MAURICIO DANIEL [[id number]]  Attending Physician: ARRIEN, MAURICIO DANIEL [[id number]]  Certification:: I certify this patient will need inpatient services for at least 2 midnights  Expected Medical Readiness: 09/04/2024          B Medical/Surgery History Past Medical History:  Diagnosis Date   Anemia    mild normocytic anemia   Anxiety    Bundle branch block    Coronary artery disease    single vessel of the LAD   Exertional angina    GERD (gastroesophageal reflux disease)    Lung nodule    lower   Normal echocardiogram    normal lv function   Thrombocytopenia    platelet count 80,000   Past Surgical History:  Procedure Laterality Date   CARDIAC CATHETERIZATION N/A 06/12/2015   Procedure: Left Heart Cath and Cors/Grafts Angiography;  Surgeon: Ozell Fell, MD;  Location: [facility] INVASIVE CV LAB;  Service: Cardiovascular;  Laterality: N/A;   COLONOSCOPY N/A 10/07/2016   Procedure: COLONOSCOPY;  Surgeon: Claudis RAYMOND Rivet, MD;  [facility];  Service: Endoscopy;  Laterality: N/A;  2:45   CORONARY ARTERY BYPASS GRAFT  11/04/2008      Maude Fleeta Ochoa, M.D.   YAG LASER APPLICATION Right 06/28/2017   Procedure: YAG LASER APPLICATION;  Surgeon: Roz Anes, MD;  Location: [facility] ORS;  Service: Ophthalmology;  Laterality: Right;   YAG LASER APPLICATION Left 07/19/2017   Procedure: YAG LASER APPLICATION;  Surgeon: Roz Anes, MD;  Location: [facility] ORS;  Service: Ophthalmology;  Laterality: Left;     A IV Location/Drains/Wounds Patient Lines/Drains/Airways Status     Active Line/Drains/Airways     Name Placement date Placement time Site Days   Peripheral IV 09/02/24 20 G Anterior;Proximal;Right Forearm 09/02/24  1904  Forearm  less than 1            Intake/Output Last 24 hours No intake or output data in the 24 hours ending 09/02/24 2037  Labs/Imaging Results for orders placed or performed during the hospital encounter of 09/02/24 (from the past 48 hours)  Basic metabolic panel     Status: Abnormal   Collection Time: 09/02/24  1:17 PM  Result Value Ref Range   Sodium 137 135 - 145 mmol/L   Potassium 4.3 3.5 - 5.1 mmol/L   Chloride 102 98 - 111 mmol/L   CO2 23 22 - 32 mmol/L   Glucose, Bld 165 (H) 70 - 99 mg/dL    Comment: Glucose reference range applies only to samples taken after fasting for at least 8 hours.   BUN 24 (H) 8 - 23 mg/dL   Creatinine, Ser 9.15 0.44 - 1.00  mg/dL   Calcium  9.1 8.9 - 10.3 mg/dL   GFR, Estimated >39 >39 mL/min    Comment: (NOTE) Calculated using the CKD-EPI Creatinine Equation (2021)    Anion gap 12 5 - 15    Comment: Performed at [facility], [address], [place], [place] [address]  CBC     Status: None   Collection Time: 09/02/24  1:17 PM  Result Value Ref Range   WBC 7.5 4.0 - 10.5 K/uL   RBC 4.15 3.87 - 5.11 MIL/uL   Hemoglobin 13.1 12.0 - 15.0 g/dL   HCT 62.8 63.9 - 53.9 %   MCV 89.4 80.0 - 100.0 fL   MCH 31.6 26.0 - 34.0 pg   MCHC 35.3 30.0 - 36.0 g/dL   RDW 86.6 88.4 - 84.4 %   Platelets 157 150 - 400 K/uL   nRBC 0.0 0.0 - 0.2 %    Comment: Performed at [facility], [address] [place], [place] [address]  Resp panel by RT-PCR (RSV, Flu A&B, Covid) Anterior Nasal Swab     Status: None   Collection Time: 09/02/24  3:33 PM   Specimen: Anterior Nasal Swab  Result Value Ref Range   SARS Coronavirus 2 by RT PCR NEGATIVE NEGATIVE    Comment: (NOTE) SARS-CoV-2 target nucleic acids are NOT DETECTED.  The SARS-CoV-2 RNA is generally detectable in upper respiratory specimens during the acute phase of infection. The lowest concentration of SARS-CoV-2 viral copies this assay can detect is 138 copies/mL. A negative result does not preclude SARS-Cov-2 infection and should not be used as the sole basis for treatment or other patient management decisions. A negative result may occur with  improper specimen collection/handling, submission of specimen other than nasopharyngeal swab, presence of viral mutation(s) within the areas targeted by this assay, and inadequate number of viral copies(<138 copies/mL). A negative result must be combined with clinical observations, patient history, and epidemiological information. The expected result is Negative.  Fact Sheet for Patients:  BloggerCourse.com  Fact Sheet for Healthcare Providers:  SeriousBroker.it  This test is no t yet approved or cleared by the [place]  FDA and  has been authorized for detection and/or diagnosis of SARS-CoV-2 by FDA under an Emergency Use Authorization (EUA). This EUA will remain  in effect (meaning this test can be used) for the duration of the COVID-19 declaration under Section 564(b)(1) of the Act, 21 U.S.C.section 360bbb-3(b)(1), unless the authorization is terminated  or revoked sooner.       Influenza A by PCR NEGATIVE NEGATIVE   Influenza B by PCR NEGATIVE NEGATIVE    Comment: (NOTE) The Xpert Xpress SARS-CoV-2/FLU/RSV plus assay is intended as an aid in the diagnosis of influenza from Nasopharyngeal swab specimens  and should not be used as a sole basis for treatment. Nasal washings and aspirates are unacceptable for Xpert Xpress SARS-CoV-2/FLU/RSV testing.  Fact Sheet for Patients: BloggerCourse.com  Fact Sheet for Healthcare Providers: SeriousBroker.it  This test is not yet approved or cleared by the [place]  FDA and has been authorized for detection and/or diagnosis of SARS-CoV-2 by FDA under an Emergency Use Authorization (EUA). This EUA will remain in effect (meaning this test can be used) for the duration of the COVID-19 declaration under Section 564(b)(1) of the Act, 21 U.S.C. section 360bbb-3(b)(1), unless the authorization is terminated or revoked.     Resp Syncytial Virus by PCR NEGATIVE NEGATIVE    Comment: (NOTE) Fact Sheet for Patients: BloggerCourse.com  Fact Sheet for Healthcare Providers:  SeriousBroker.it  This test is not yet approved or cleared by the [place]  FDA and has been authorized for detection and/or diagnosis of SARS-CoV-2 by FDA under an Emergency Use Authorization (EUA). This EUA will remain in effect (meaning this test can be used) for the duration of the COVID-19 declaration under Section 564(b)(1) of the Act, 21 U.S.C. section 360bbb-3(b)(1), unless the authorization is terminated or revoked.  Performed at [facility], [address] [place], [place] [address]   D-dimer, quantitative     Status: Abnormal   Collection Time: 09/02/24  3:49 PM  Result Value Ref Range   D-Dimer, Quant 0.68 (H) 0.00 - 0.50 ug/mL-FEU    Comment: (NOTE) At the manufacturer cut-off value of 0.5 g/mL FEU, this assay has a negative predictive value of 95-100%.This assay is intended for use in conjunction with a clinical pretest probability (PTP) assessment model to exclude pulmonary embolism (PE) and deep venous thrombosis (DVT) in outpatients suspected of PE or  DVT. Results should be correlated with clinical presentation. Performed at [facility], [address], [place], [place] [address]   Troponin I (High Sensitivity)     Status: None   Collection Time: 09/02/24  3:49 PM  Result Value Ref Range   Troponin I (High Sensitivity) 3 <18 ng/L    Comment: (NOTE) Elevated high sensitivity troponin I (hsTnI) values and significant  changes across serial measurements may suggest ACS but many other  chronic and acute conditions are known to elevate hsTnI results.  Refer to the Links section for chest pain algorithms and additional  guidance. Performed at [facility], [address], [place], [place] [address]   Brain natriuretic peptide     Status: Abnormal   Collection Time: 09/02/24  3:49 PM  Result Value Ref Range   B Natriuretic Peptide 126.0 (H) 0.0 - 100.0 pg/mL    Comment: Performed at [facility], [address], [place], [place] [address]  Troponin I (High Sensitivity)     Status: None   Collection Time: 09/02/24  5:21 PM  Result Value Ref Range   Troponin I (High Sensitivity) 4 <18 ng/L    Comment: (NOTE) Elevated high sensitivity troponin I (hsTnI) values and significant  changes across serial measurements may suggest ACS but many other  chronic and acute conditions are known to elevate hsTnI results.  Refer to the Links section for chest pain algorithms and additional  guidance. Performed at [facility], [address] [place], [place] [address]    DG Chest 2 View Result Date: 09/02/2024 CLINICAL DATA:  sob EXAM: CHEST - 2 VIEW COMPARISON:  June 16, 2019 FINDINGS: The cardiomediastinal silhouette is unchanged in contour.Status post median sternotomy and CABG. Atherosclerotic calcifications. No pleural effusion. No pneumothorax. Mild background of diffuse reticular prominence and mild bronchial markings. Visualized abdomen is unremarkable. Multilevel degenerative changes of the thoracic spine. IMPRESSION: Mild background of  diffuse reticular prominence and mild bronchial markings. Findings could reflect mild pulmonary edema/small airways disease. Electronically Signed   By: Corean Salter M.D.   On: 09/02/2024 13:57    Pending Labs Unresulted Labs (From admission, onward)     Start     Ordered   09/09/24 0500  Creatinine, serum  (enoxaparin (LOVENOX)    CrCl >/= 30 ml/min)  Weekly,   R     Comments: while on enoxaparin therapy    09/02/24 1955   09/03/24 0500  Basic metabolic panel  Tomorrow morning,   R  09/02/24 1955   09/03/24 0500  CBC  Tomorrow morning,   R        09/02/24 1955            Vitals/Pain Today's Vitals   09/02/24 1700 09/02/24 1730 09/02/24 1915 09/02/24 2015  BP: (!) 165/103 (!) 147/80 (!) 142/66 133/63  Pulse: 82 69 81 76  Resp:  17 18 20   Temp: 98.5 F (36.9 C)     TempSrc: Oral     SpO2: 100% 98% 98% 96%  Weight:      Height:      PainSc:        Isolation Precautions No active isolations  Medications Medications  enoxaparin (LOVENOX) injection 40 mg (40 mg Subcutaneous Given 09/02/24 2008)  acetaminophen  (TYLENOL ) tablet 650 mg (has no administration in time range)    Or  acetaminophen  (TYLENOL ) suppository 650 mg (has no administration in time range)  ondansetron  (ZOFRAN ) tablet 4 mg (has no administration in time range)    Or  ondansetron  (ZOFRAN ) injection 4 mg (has no administration in time range)  ipratropium-albuterol (DUONEB) 0.5-2.5 (3) MG/3ML nebulizer solution 3 mL (3 mLs Nebulization Given 09/02/24 1647)  furosemide (LASIX) injection 20 mg (20 mg Intravenous Given 09/02/24 1904)    Mobility walks with person assist     Focused Assessments    R Recommendations: See Admitting Provider Note  Report given to: [place]   Additional Notes:

## 2024-09-02 NOTE — H&P (Signed)
 History and Physical    Patient: [name] Graham DOB: [birth year] DOA: 09/02/2024 DOS: the patient was seen and examined on 09/02/2024 PCP: Rosamond Leta NOVAK, MD  Patient coming from: Home  Chief Complaint:  Chief Complaint  Patient presents with   Shortness of Breath   HPI: [name] Graham is a 89 y.o. female with medical history significant of chronic anemia, anxiety, GERD, and coronary artery disease who presented with dyspnea on exertion.  Reported 7 days of worsening dyspnea on exertion, to the point where she is having symptoms with minimal efforts. Associated with chest pressure, orthopnea, PND and lower extremity edema. No cough, increased sputum, production or pleuritic chest pain. No fever or chills.  No recent change in her medications, no frank angina or claudication.  Today she had severe symptoms with air hunger, and thought she needed a breathing treatment.   Old records personally reviewed, she had coronary artery bypass grafting in 2009, apparently had chronic angina managed with metoprolol  and high dose isosorbide . Last cardiology follow up was 11/2023 with Dr Copper and no medication changes were made.    Review of Systems: As mentioned in the history of present illness. All other systems reviewed and are negative. Past Medical History:  Diagnosis Date   Anemia    mild normocytic anemia   Anxiety    Bundle branch block    Coronary artery disease    single vessel of the LAD   Exertional angina    GERD (gastroesophageal reflux disease)    Lung nodule    lower   Normal echocardiogram    normal lv function   Thrombocytopenia    platelet count 80,000   Past Surgical History:  Procedure Laterality Date   CARDIAC CATHETERIZATION N/A 06/12/2015   Procedure: Left Heart Cath and Cors/Grafts Angiography;  Surgeon: Ozell Fell, MD;  Location: [facility] INVASIVE CV LAB;  Service: Cardiovascular;  Laterality: N/A;   COLONOSCOPY N/A 10/07/2016   Procedure:  COLONOSCOPY;  Surgeon: Claudis RAYMOND Rivet, MD;  [facility];  Service: Endoscopy;  Laterality: N/A;  2:45   CORONARY ARTERY BYPASS GRAFT  11/04/2008     Maude Fleeta Ochoa, M.D.   YAG LASER APPLICATION Right 06/28/2017   Procedure: YAG LASER APPLICATION;  Surgeon: Roz Anes, MD;  Location: [facility] ORS;  Service: Ophthalmology;  Laterality: Right;   YAG LASER APPLICATION Left 07/19/2017   Procedure: YAG LASER APPLICATION;  Surgeon: Roz Anes, MD;  Location: [facility] OR[facility];  Service: Ophthalmology;  Laterality: Left;   Social History:  reports that she has never smoked. She has never used smokeless tobacco. She reports that she does not drink alcohol and does not use drugs.  No Known Allergies  Family History  Problem Relation Age of Onset   Heart failure Mother    Heart failure Father    CAD Sister    Breast cancer Neg Hx     Prior to Admission medications   Medication Sig Start Date End Date Taking? Authorizing Provider  aspirin  81 MG tablet Take 81 mg by mouth daily.    [provider]  Calcium  Carbonate-Vit D-Min 600-400 MG-UNIT TABS Take 1 tablet by mouth daily.      [provider]  hydrochlorothiazide  (MICROZIDE ) 12.5 MG capsule Take 1 capsule (12.5 mg total) by mouth daily as needed (swelling). 12/15/20   Fell Ozell, MD  isosorbide  mononitrate (IMDUR ) 120 MG 24 hr tablet Take 1 tablet (120 mg total) by mouth daily. 11/14/23   Fell Ozell,  MD  latanoprost (XALATAN) 0.005 % ophthalmic solution SMARTSIG:In Eye(s) 11/05/20   [provider]  lisinopril  (ZESTRIL ) 5 MG tablet Take 1 tablet (5 mg total) by mouth daily. 11/14/23   Cooper, Michael, MD  meclizine (ANTIVERT) 12.5 MG tablet Take 12.5 mg by mouth 3 (three) times daily as needed. 09/01/22   [provider]  metFORMIN (GLUCOPHAGE) 500 MG tablet Take 500 mg by mouth daily. 04/24/19   [provider]  metoprolol  tartrate (LOPRESSOR ) 50 MG tablet Take 1.5 tablets (75 mg total) by mouth  2 (two) times daily. 11/14/23   Wonda Sharper, MD  Multiple Vitamins-Minerals (MULTIVITAMIN WITH MINERALS) tablet Take 1 tablet by mouth daily.      [provider]  naproxen  (NAPROSYN ) 500 MG tablet Take 1 tablet (500 mg total) by mouth 2 (two) times daily with a meal. 12/23/22   Brenna Lin, MD  nitroGLYCERIN  (NITROSTAT ) 0.4 MG SL tablet Place 1 tablet (0.4 mg total) under the tongue every 5 (five) minutes as needed for chest pain. 11/14/23   Wonda Sharper, MD  omeprazole  (PRILOSEC) 40 MG capsule Take 40 mg by mouth daily. 09/27/20   [provider]  ondansetron  (ZOFRAN -ODT) 4 MG disintegrating tablet Take 4 mg by mouth 3 (three) times daily as needed. 09/01/22   [provider]  pantoprazole  (PROTONIX ) 40 MG tablet Take 1 tablet (40 mg total) by mouth daily before breakfast. 11/14/23   Wonda Sharper, MD  potassium chloride  (KLOR-CON ) 10 MEQ tablet Take by mouth. 05/15/18   [provider]  rosuvastatin  (CRESTOR ) 20 MG tablet Take 1 tablet (20 mg total) by mouth daily. 11/14/23   Cooper, Michael, MD  XIIDRA 5 % SOLN Apply 1 drop to eye 2 (two) times daily. 07/09/22   [provider]    Physical Exam: Vitals:   09/02/24 1700 09/02/24 1730 09/02/24 1915 09/02/24 2015  BP: (!) 165/103 (!) 147/80 (!) 142/66 133/63  Pulse: 82 69 81 76  Resp:  17 18 20   Temp: 98.5 F (36.9 C)     TempSrc: Oral     SpO2: 100% 98% 98% 96%  Weight:      Height:       BP 133/63   Pulse 76   Temp 98.5 F (36.9 C) (Oral)   Resp 20   Ht 5' 4 (1.626 m)   Wt 54.4 kg   SpO2 96%   BMI 20.60 kg/m   Neurology awake and alert, deconditioned and ill looking appearing ENT with mild pallor Cardiovascular with S1 and S2 present and regular with no gallops, rubs or murmurs No JVD Respiratory with mild rales at bases with prolonged expiratory phase and expiratory wheezing, no rhonchi  Abdomen with no distention  Lower extremity edema +   Data Reviewed:   Na 137, K  4,3 Cl 102 bicarbonate 23, glucose 165 bun 24 cr 0,84  BNP 126  High sensitive troponin 3 and 4  Wbc 7,5 hgb 13.1 plt 157  D dimer 0,68  Sars covid 19 negative Influenza negative RSV negative   Chest radiograph with left rotation, positive cardiomegaly, prominent pulmonary vasculature, sternotomy wires in place.   EKG 79 bpm, normal axis, left bundle branch block, qtc 499, sinus rhythm with no significant ST segment or T wave changes. Poor RR wave progression.    Assessment and Plan: * Bronchitis Worsening dyspnea on exertion, on physical examination with not much volume overload but signs of air trapping.   Plan for bronchodilator therapy, inhaled corticosteroids  and systemic steroids. Further work up with CT chest ruled out PE, considering high d dimer. Continue oxymetry monitoring.   Essential hypertension, benign Continue blood pressure control with metoprolol , lisinopril , hydrochlorothiazide  and isosorbide .   CORONARY ATHEROSCLEROSIS NATIVE CORONARY ARTERY No frank angina, high sensitive troponin negative Continue aspirin  and statin Isosorbide , metoprolol  and lisinopril .  Further work up with echocardiogram.   Type 2 diabetes mellitus with hyperlipidemia (HCC) Hold on metformin for now.  Admission glucose not elevated, hold on insulin for now.  Continue with statin.     Advance Care Planning:   Code Status: Full Code   Consults: none   Family Communication: I spoke with patient's sisters at the bedside, we talked in detail about patient's condition, plan of care and prognosis and all questions were addressed.   Severity of Illness: The appropriate patient status for this patient is INPATIENT. Inpatient status is judged to be reasonable and necessary in order to provide the required intensity of service to ensure the patient's safety. The patient's presenting symptoms, physical exam findings, and initial radiographic and laboratory data in the context of their chronic  comorbidities is felt to place them at high risk for further clinical deterioration. Furthermore, it is not anticipated that the patient will be medically stable for discharge from the hospital within 2 midnights of admission.   * I certify that at the point of admission it is my clinical judgment that the patient will require inpatient hospital care spanning beyond 2 midnights from the point of admission due to high intensity of service, high risk for further deterioration and high frequency of surveillance required.*  Author: Elidia Toribio Furnace, MD 09/02/2024 8:31 PM  For on call review [email or web address].

## 2024-09-02 NOTE — ED Provider Notes (Signed)
 [name] EMERGENC[facility] DEPARTMENT AT [facility] Provider Note   CSN: [id number] Arrival date & time: 09/02/24  1226     Patient presents with: Shortness of Breath   [name] Graham is a 89 y.o. female.    Shortness of Breath Associated symptoms: no abdominal pain, no chest pain, no cough, no fever, no headaches, no vomiting and no wheezing         [name] Graham is a 89 y.o. female past medical tree of anemia, thrombocytopenia, GERD, coronary artery disease, and exertional angina who presents to the Emergency Department complaining of shortness of breath for several days.  Dyspnea is exertional and worse when she sits up from a seated position.  Denies any chest pain, fever, chills, cough or swelling of her extremities.  She was previously requests to have a breathing treatment and feels as though she is wheezing.  Denies any known sick contacts.  No back pain  Prior to Admission medications   Medication Sig Start Date End Date Taking? Authorizing Provider  aspirin  81 MG tablet Take 81 mg by mouth daily.    [provider]  Calcium  Carbonate-Vit D-Min 600-400 MG-UNIT TABS Take 1 tablet by mouth daily.      [provider]  hydrochlorothiazide  (MICROZIDE ) 12.5 MG capsule Take 1 capsule (12.5 mg total) by mouth daily as needed (swelling). 12/15/20   Wonda Sharper, MD  isosorbide  mononitrate (IMDUR ) 120 MG 24 hr tablet Take 1 tablet (120 mg total) by mouth daily. 11/14/23   Cooper, Michael, MD  latanoprost (XALATAN) 0.005 % ophthalmic solution SMARTSIG:In Eye(s) 11/05/20   [provider]  lisinopril  (ZESTRIL ) 5 MG tablet Take 1 tablet (5 mg total) by mouth daily. 11/14/23   Cooper, Michael, MD  meclizine (ANTIVERT) 12.5 MG tablet Take 12.5 mg by mouth 3 (three) times daily as needed. 09/01/22   [provider]  metFORMIN (GLUCOPHAGE) 500 MG tablet Take 500 mg by mouth daily. 04/24/19   [provider]  metoprolol  tartrate  (LOPRESSOR ) 50 MG tablet Take 1.5 tablets (75 mg total) by mouth 2 (two) times daily. 11/14/23   Wonda Sharper, MD  Multiple Vitamins-Minerals (MULTIVITAMIN WITH MINERALS) tablet Take 1 tablet by mouth daily.      [provider]  naproxen  (NAPROSYN ) 500 MG tablet Take 1 tablet (500 mg total) by mouth 2 (two) times daily with a meal. 12/23/22   Brenna Lin, MD  nitroGLYCERIN  (NITROSTAT ) 0.4 MG SL tablet Place 1 tablet (0.4 mg total) under the tongue every 5 (five) minutes as needed for chest pain. 11/14/23   Wonda Sharper, MD  omeprazole  (PRILOSEC) 40 MG capsule Take 40 mg by mouth daily. 09/27/20   [provider]  ondansetron  (ZOFRAN -ODT) 4 MG disintegrating tablet Take 4 mg by mouth 3 (three) times daily as needed. 09/01/22   [provider]  pantoprazole  (PROTONIX ) 40 MG tablet Take 1 tablet (40 mg total) by mouth daily before breakfast. 11/14/23   Wonda Sharper, MD  potassium chloride  (KLOR-CON ) 10 MEQ tablet Take by mouth. 05/15/18   [provider]  rosuvastatin  (CRESTOR ) 20 MG tablet Take 1 tablet (20 mg total) by mouth daily. 11/14/23   Cooper, Michael, MD  XIIDRA 5 % SOLN Apply 1 drop to eye 2 (two) times daily. 07/09/22   [provider]    Allergies: Patient has no known allergies.    Review of Systems  Constitutional:  Negative for chills and fever.  HENT:  Negative for congestion.  Respiratory:  Positive for shortness of breath. Negative for cough and wheezing.   Cardiovascular:  Negative for chest pain, palpitations and leg swelling.  Gastrointestinal:  Negative for abdominal pain, nausea and vomiting.  Musculoskeletal:  Negative for back pain.  Neurological:  Negative for dizziness, weakness, numbness and headaches.    Updated Vital Signs BP 134/70 (BP Location: Right Arm)   Pulse 76   Temp 98.2 F (36.8 C)   Resp 19   Ht 5' 4 (1.626 m)   Wt 54.4 kg   SpO2 98%   BMI 20.60 kg/m   Physical Exam Vitals and nursing note  reviewed.  Constitutional:      Appearance: She is well-developed.  HENT:     Mouth/Throat:     Mouth: Mucous membranes are moist.  Cardiovascular:     Rate and Rhythm: Normal rate and regular rhythm.     Pulses: Normal pulses.  Pulmonary:     Effort: Pulmonary effort is normal.     Breath sounds: No wheezing, rhonchi or rales.     Comments:  increased work of breathing on my exam. No rales or rhonchi Abdominal:     Palpations: Abdomen is soft.     Tenderness: There is no abdominal tenderness.  Musculoskeletal:     Right lower leg: Edema present.     Left lower leg: Edema present.  Skin:    General: Skin is warm.     Capillary Refill: Capillary refill takes less than 2 seconds.  Neurological:     General: No focal deficit present.     Mental Status: She is alert.     Sensory: No sensory deficit.     Motor: No weakness.     (all labs ordered are listed, but only abnormal results are displayed) Labs Reviewed  BASIC METABOLIC PANEL WITH GFR - Abnormal; Notable for the following components:      Result Value   Glucose, Bld 165 (*)    BUN 24 (*)    All other components within normal limits  D-DIMER, QUANTITATIVE - Abnormal; Notable for the following components:   D-Dimer, Quant 0.68 (*)    All other components within normal limits  BRAIN NATRIURETIC PEPTIDE - Abnormal; Notable for the following components:   B Natriuretic Peptide 126.0 (*)    All other components within normal limits  RESP PANEL BY RT-PCR (RSV, FLU A&B, COVID)  [id number]  CBC  TROPONIN I (HIGH SENSITIVITY)  TROPONIN I (HIGH SENSITIVITY)    EKG: EKG Interpretation Date/Time:  Sunday September 02 2024 12:46:48 EDT Ventricular Rate:  79 PR Interval:  168 QRS Duration:  122 QT Interval:  436 QTC Calculation: 499 R Axis:   32  Text Interpretation: Normal sinus rhythm Cannot rule out Anterior infarct , age undetermined Abnormal ECG No significant change since last tracing Confirmed by Francesca Fallow  [phone]) on 09/02/2024 6:07:50 PM  Radiology: DG Chest 2 View Result Date: 09/02/2024 CLINICAL DATA:  sob EXAM: CHEST - 2 VIEW COMPARISON:  June 16, 2019 FINDINGS: The cardiomediastinal silhouette is unchanged in contour.Status post median sternotomy and CABG. Atherosclerotic calcifications. No pleural effusion. No pneumothorax. Mild background of diffuse reticular prominence and mild bronchial markings. Visualized abdomen is unremarkable. Multilevel degenerative changes of the thoracic spine. IMPRESSION: Mild background of diffuse reticular prominence and mild bronchial markings. Findings could reflect mild pulmonary edema/small airways disease. Electronically Signed   By: Corean Salter M.D.   On: 09/02/2024 13:57     Procedures   Medications  Ordered in the ED  ipratropium-albuterol (DUONEB) 0.5-2.5 (3) MG/3ML nebulizer solution 3 mL (has no administration in time range)                                    Medical Decision Making Patient here from home for evaluation of dyspnea on exertion.  Denies any cough fever or chills no known sick contacts.  Symptoms present for several days.  She feels as though she is wheezing and this worsens when she leans forward from a seated position.  Denies any peripheral edema   Viral process, pneumonia, PE, ACS, COPD, CHF all considered.  Appears somewhat volume overloaded on exam  Amount and/or Complexity of Data Reviewed Labs: ordered.    Details: No leukocytosis, chemistries reassuring.  Tor[place] panel negative, BNP mildly elevated, initial troponin reassuring.  Her dimer is slightly elevated from baseline but age-adjusted for VTE Radiology: ordered.    Details: Chest x-ray shows background of reticular prominence and bronchial markings, could reflect pulmonary edema ECG/medicine tests: ordered.    Details: EKG shows sinus rhythm, cannot anterior infarct, age undetermined no significant change from last tracing Discussion of management or test  interpretation with external provider(s):  Patient also seen by Dr. Francesca.  Suspect developing CHF, patient does seem to have labored breathing although her lungs are clear on my exam.  Last echo was 2014 EF was 60 to 65% at that time.  Some peripheral edema on exam.  Will give IV Lasix and she is agreeable to hospital admission.  Discussed with [facility] hospitalist, Dr. Noralee who agrees to admit    Risk Prescription drug management.        Final diagnoses:  SOB (shortness of breath)  Acute congestive heart failure, unspecified heart failure type [facility])    ED Discharge Orders     None          Herlinda Milling, PA-C 09/02/24 1925    Francesca Elsie CROME, MD 09/02/24 2315

## 2024-09-02 NOTE — Assessment & Plan Note (Signed)
 Hold on metformin for now.  Admission glucose not elevated, hold on insulin for now.  Continue with statin.

## 2024-09-02 NOTE — Assessment & Plan Note (Signed)
 Continue blood pressure control with metoprolol , lisinopril , hydrochlorothiazide  and isosorbide .

## 2024-09-03 ENCOUNTER — Other Ambulatory Visit (HOSPITAL_COMMUNITY): Payer: Self-pay | Admitting: *Deleted

## 2024-09-03 ENCOUNTER — Observation Stay (HOSPITAL_COMMUNITY)

## 2024-09-03 DIAGNOSIS — R0609 Other forms of dyspnea: Secondary | ICD-10-CM

## 2024-09-03 DIAGNOSIS — J40 Bronchitis, not specified as acute or chronic: Secondary | ICD-10-CM | POA: Diagnosis not present

## 2024-09-03 DIAGNOSIS — I509 Heart failure, unspecified: Secondary | ICD-10-CM

## 2024-09-03 LAB — ECHOCARDIOGRAM COMPLETE
Area-P 1/2: 1.85 cm2
Height: 64 in
S' Lateral: 2 cm
Weight: 2049.6 [oz_av]

## 2024-09-03 LAB — CBC
HCT: 38.7 % (ref 36.0–46.0)
Hemoglobin: 13.9 g/dL (ref 12.0–15.0)
MCH: 31.4 pg (ref 26.0–34.0)
MCHC: 35.9 g/dL (ref 30.0–36.0)
MCV: 87.4 fL (ref 80.0–100.0)
Platelets: 127 K/uL — ABNORMAL LOW (ref 150–400)
RBC: 4.43 MIL/uL (ref 3.87–5.11)
RDW: 13.2 % (ref 11.5–15.5)
WBC: 6.7 K/uL (ref 4.0–10.5)
nRBC: 0 % (ref 0.0–0.2)

## 2024-09-03 LAB — BASIC METABOLIC PANEL WITH GFR
Anion gap: 13 (ref 5–15)
BUN: 21 mg/dL (ref 8–23)
CO2: 20 mmol/L — ABNORMAL LOW (ref 22–32)
Calcium: 8.8 mg/dL — ABNORMAL LOW (ref 8.9–10.3)
Chloride: 103 mmol/L (ref 98–111)
Creatinine, Ser: 0.85 mg/dL (ref 0.44–1.00)
GFR, Estimated: >60 mL/min (ref >60)
Glucose, Bld: 278 mg/dL — ABNORMAL HIGH (ref 70–99)
Potassium: 3.9 mmol/L (ref 3.5–5.1)
Sodium: 136 mmol/L (ref 135–145)

## 2024-09-03 MED ORDER — LISINOPRIL 10 MG PO TABS: 10.0000 mg | ORAL_TABLET | Freq: Every day | ORAL | 1 refills | Status: AC

## 2024-09-03 MED ORDER — CALCIUM CARBONATE-VIT D-MIN 600-400 MG-UNIT PO TABS: 1.0000 | ORAL_TABLET | Freq: Every day | ORAL | 1 refills | Status: AC

## 2024-09-03 MED ORDER — PREDNISONE 10 MG PO TABS
40.0000 mg | ORAL_TABLET | Freq: Every day | ORAL | 0 refills | Status: AC
Start: 2024-09-03 — End: 2024-09-08

## 2024-09-03 MED ORDER — ALBUTEROL SULFATE HFA 108 (90 BASE) MCG/ACT IN AERS: 2.0000 | INHALATION_SPRAY | Freq: Four times a day (QID) | RESPIRATORY_TRACT | 2 refills | Status: AC | PRN

## 2024-09-03 MED ORDER — OMEPRAZOLE 40 MG PO CPDR: 40.0000 mg | DELAYED_RELEASE_CAPSULE | Freq: Every day | ORAL | 0 refills | Status: AC

## 2024-09-03 MED ORDER — HYDROCHLOROTHIAZIDE 12.5 MG PO TABS: 12.5000 mg | ORAL_TABLET | Freq: Every day | ORAL | 0 refills | Status: AC | PRN

## 2024-09-03 MED ORDER — IPRATROPIUM-ALBUTEROL 0.5-2.5 (3) MG/3ML IN SOLN: 3.0000 mL | Freq: Two times a day (BID) | RESPIRATORY_TRACT | Status: DC

## 2024-09-03 MED ORDER — FUROSEMIDE 20 MG PO TABS
20.0000 mg | ORAL_TABLET | Freq: Every day | ORAL | 1 refills | Status: AC | PRN
Start: 2024-09-03 — End: 2025-09-03

## 2024-09-03 NOTE — Care Management CC44 (Signed)
 Condition Code 44 Documentation Completed  Patient Details  Name: [name] Graham MRN: [id number] Date of Birth: 19-Mar-1934   Condition Code 44 given:  Yes Patient signature on Condition Code 44 notice:  Yes Documentation of 2 MD's agreement:  Yes Code 44 added to claim:  Yes  CM in the room , read entire form, patient still does not feel comfortable signing. She states she is afraid this is signing to bill her more and not file. She has already signed a form in the ED. Copy given.  Sharlyne Stabs, RN 09/03/2024, 12:38 PM

## 2024-09-03 NOTE — Discharge Summary (Signed)
 Physician Discharge Summary  [name] Graham FMW:[id number] DOB: Apr 21, 1934 DOA: 09/02/2024  PCP: Rosamond Leta NOVAK, MD  Admit date: 09/02/2024  Discharge date: 09/03/2024  Admitted From:Home  Disposition:  Home  Recommendations for Outpatient Follow-up:  Follow up with PCP in 1-2 weeks Remain on breathing treatments as needed for shortness of breath or wheezing Remain on steroids as prescribed for 5 more days Lasix 20 mg to use as needed daily for any worsening edema or shortness of breath Continue other home medications as noted below Plan to follow-up with cardiology office in [place] with echo as noted below, appointment scheduled for 10/17 and listed on AVS  Home Health: None  Equipment/Devices: None  Discharge Condition:Stable  CODE STATUS: Full  Diet recommendation: Heart Healthy  Brief/Interim Summary: [name] Graham is a 89 y.o. female with medical history significant of chronic anemia, anxiety, GERD, and coronary artery disease who presented with dyspnea on exertion.   Reported 7 days of worsening dyspnea on exertion, to the point where she is having symptoms with minimal efforts. Associated with chest pressure, orthopnea, PND and lower extremity edema.  Patient was admitted with acute bronchitis and did not appear to have any signs of volume overload, however 2D echocardiogram showed some signs of diastolic CHF, grade 1.  She was started on treatment with inhaled and systemic steroids as well as breathing treatments with improvement overall noted and she is now eager for discharge.  She would like to go to a cardiology office that is more local for her as she cannot drive down to [place] any longer.  This follow-up has been arranged as noted above.  She has been given some Lasix to take in case she needs this for any worsening dyspnea or edema.  Discharge Diagnoses:  Principal Problem:   Bronchitis Active Problems:   Essential hypertension, benign   CORONARY  ATHEROSCLEROSIS NATIVE CORONARY ARTERY   Type 2 diabetes mellitus with hyperlipidemia (HCC)   Acute heart failure (HCC)  Principal discharge diagnosis: Acute bronchitis with possible mild HFpEF.  Discharge Instructions  Discharge Instructions     Diet - low sodium heart healthy   Complete by: As directed    Increase activity slowly   Complete by: As directed       Allergies as of 09/03/2024   No Known Allergies      Medication List     STOP taking these medications    hydrochlorothiazide  12.5 MG capsule Commonly known as: MICROZIDE  Replaced by: hydrochlorothiazide  12.5 MG tablet       TAKE these medications    albuterol 108 (90 Base) MCG/ACT inhaler Commonly known as: VENTOLIN HFA Inhale 2 puffs into the lungs every 6 (six) hours as needed for wheezing or shortness of breath.   aspirin  81 MG tablet Take 81 mg by mouth daily.   azelastine 0.05 % ophthalmic solution Commonly known as: OPTIVAR Place 1 drop into both eyes 2 (two) times daily.   brimonidine 0.1 % Soln Commonly known as: ALPHAGAN P Place 1 drop into the right eye every 8 (eight) hours.   Calcium  Carbonate-Vit D-Min 600-400 MG-UNIT Tabs Take 1 tablet by mouth daily.   dorzolamide-timolol 2-0.5 % ophthalmic solution Commonly known as: COSOPT Place 1 drop into both eyes 2 (two) times daily.   furosemide 20 MG tablet Commonly known as: Lasix Take 1 tablet (20 mg total) by mouth daily as needed for fluid or edema.   hydrochlorothiazide  12.5 MG tablet Commonly known as: HYDRODIURIL  Take 1 tablet (  12.5 mg total) by mouth daily as needed (swelling). Replaces: hydrochlorothiazide  12.5 MG capsule   latanoprost 0.005 % ophthalmic solution Commonly known as: XALATAN SMARTSIG:In Eye(s)   lisinopril  10 MG tablet Commonly known as: ZESTRIL  Take 10 mg by mouth daily. What changed: Another medication with the same name was changed. Make sure you understand how and when to take each.   lisinopril  10  MG tablet Commonly known as: ZESTRIL  Take 1 tablet (10 mg total) by mouth daily. Start taking on: September 04, 2024 What changed:  medication strength how much to take   metFORMIN 500 MG tablet Commonly known as: GLUCOPHAGE Take 500 mg by mouth daily.   metoprolol  tartrate 50 MG tablet Commonly known as: LOPRESSOR  Take 1.5 tablets (75 mg total) by mouth 2 (two) times daily. What changed:  how much to take when to take this   multivitamin with minerals tablet Take 1 tablet by mouth daily.   nitroGLYCERIN  0.4 MG SL tablet Commonly known as: NITROSTAT  Place 1 tablet (0.4 mg total) under the tongue every 5 (five) minutes as needed for chest pain.   omeprazole  40 MG capsule Commonly known as: PRILOSEC Take 1 capsule (40 mg total) by mouth daily.   predniSONE  10 MG tablet Commonly known as: DELTASONE  Take 4 tablets (40 mg total) by mouth daily for 5 days.   rosuvastatin  20 MG tablet Commonly known as: CRESTOR  Take 1 tablet (20 mg total) by mouth daily.   tobramycin 0.3 % ophthalmic solution Commonly known as: TOBREX Place 1 drop into both eyes 4 (four) times daily.        Follow-up Information     Vyas, Dhruv B, MD. Schedule an appointment as soon as possible for a visit in 1 week(s).   Specialty: Internal Medicine Contact information: [address] [place] [address] [phone]         Miriam Norris, NP. Go to.   Specialty: Cardiology Why: Friday, October 17th  at 8:30 am Contact information: [address] [place] [address] [phone]                No Known Allergies  Consultations: None   Procedures/Studies: ECHOCARDIOGRAM COMPLETE Result Date: 09/03/2024    ECHOCARDIOGRAM REPORT   Patient Name:   [name] Graham Date of Exam: 09/03/2024 Medical Rec #:  [id number]         Height:       64.0 in Accession #:    [id number]        Weight:       128.1 lb Date of Birth:  Sep 30, 1934         BSA:          1.619 m Patient Age:    89  years          BP:           124/72 mmHg Patient Gender: F                 HR:           90 bpm. Exam Location:  [facility] Procedure: 2D Echo, Cardiac Doppler, Color Doppler and Strain Analysis (Both            Spectral and Color Flow Doppler were utilized during procedure). Indications:    Dyspnea R06.00  History:        Patient has prior history of Echocardiogram examinations. CAD,  Arrythmias:LBBB; Risk Factors:Hypertension, Diabetes and                 Dyslipidemia.  Sonographer:    Aida Pizza RCS Referring Phys: [id number] MAURICIO DANIEL ARRIEN  Sonographer Comments: Global longitudinal strain was attempted. IMPRESSIONS  1. Left ventricular ejection fraction, by estimation, is 60 to 65%. The left ventricle has normal function. The left ventricle has no regional wall motion abnormalities. There is mild left ventricular hypertrophy. Left ventricular diastolic parameters are consistent with Grade I diastolic dysfunction (impaired relaxation). The average left ventricular global longitudinal strain is -10.1 %.  2. Right ventricular systolic function is normal. The right ventricular size is normal. There is normal pulmonary artery systolic pressure.  3. The mitral valve is normal in structure. Trivial mitral valve regurgitation. No evidence of mitral stenosis.  4. The aortic valve is tricuspid. Aortic valve regurgitation is not visualized. No aortic stenosis is present.  5. The inferior vena cava is normal in size with greater than 50% respiratory variability, suggesting right atrial pressure of 3 mmHg. Comparison(s): No prior Echocardiogram. FINDINGS  Left Ventricle: Left ventricular ejection fraction, by estimation, is 60 to 65%. The left ventricle has normal function. The left ventricle has no regional wall motion abnormalities. The average left ventricular global longitudinal strain is -10.1 %. Strain was performed and the global longitudinal strain is indeterminate. The left ventricular  internal cavity size was normal in size. There is mild left ventricular hypertrophy. Left ventricular diastolic parameters are consistent with Grade I diastolic dysfunction (impaired relaxation). Normal left ventricular filling pressure. Right Ventricle: The right ventricular size is normal. No increase in right ventricular wall thickness. Right ventricular systolic function is normal. There is normal pulmonary artery systolic pressure. The tricuspid regurgitant velocity is 2.64 m/s, and  with an assumed right atrial pressure of 3 mmHg, the estimated right ventricular systolic pressure is 30.9 mmHg. Left Atrium: Left atrial size was normal in size. Right Atrium: Right atrial size was normal in size. Pericardium: There is no evidence of pericardial effusion. Mitral Valve: The mitral valve is normal in structure. Trivial mitral valve regurgitation. No evidence of mitral valve stenosis. Tricuspid Valve: The tricuspid valve is normal in structure. Tricuspid valve regurgitation is mild . No evidence of tricuspid stenosis. Aortic Valve: The aortic valve is tricuspid. Aortic valve regurgitation is not visualized. No aortic stenosis is present. Pulmonic Valve: The pulmonic valve was not well visualized. Pulmonic valve regurgitation is trivial. No evidence of pulmonic stenosis. Aorta: The aortic root is normal in size and structure. Venous: The inferior vena cava is normal in size with greater than 50% respiratory variability, suggesting right atrial pressure of 3 mmHg. IAS/Shunts: No atrial level shunt detected by color flow Doppler. Additional Comments: 3D was performed not requiring image post processing on an independent workstation and was indeterminate.  LEFT VENTRICLE PLAX 2D LVIDd:         4.20 cm   Diastology LVIDs:         2.00 cm   LV e' medial:    7.21 cm/s LV PW:         1.20 cm   LV E/e' medial:  12.0 LV IVS:        1.10 cm   LV e' lateral:   9.20 cm/s LVOT diam:     1.50 cm   LV E/e' lateral: 9.4 LV SV:          42 LV SV Index:   26  2D Longitudinal Strain LVOT Area:     1.77 cm  2D Strain GLS Avg:     -10.1 %  RIGHT VENTRICLE RV S prime:     11.30 cm/s TAPSE (M-mode): 1.9 cm LEFT ATRIUM             Index        RIGHT ATRIUM           Index LA diam:        3.50 cm 2.16 cm/m   RA Area:     12.30 cm LA Vol (A2C):   28.4 ml 17.54 ml/m  RA Volume:   28.10 ml  17.36 ml/m LA Vol (A4C):   37.6 ml 23.23 ml/m LA Biplane Vol: 34.2 ml 21.13 ml/m  AORTIC VALVE LVOT Vmax:   114.00 cm/s LVOT Vmean:  79.500 cm/s LVOT VTI:    0.239 m  AORTA Ao Root diam: 3.40 cm MITRAL VALVE                TRICUSPID VALVE MV Area (PHT): 1.85 cm     TR Peak grad:   27.9 mmHg MV Decel Time: 411 msec     TR Vmax:        264.00 cm/s MV E velocity: 86.45 cm/s MV A velocity: 120.00 cm/s  SHUNTS MV E/A ratio:  0.72         Systemic VTI:  0.24 m                             Systemic Diam: 1.50 cm Vishnu Priya Mallipeddi Electronically signed by Diannah Late Mallipeddi Signature Date/Time: 09/03/2024/1:49:32 PM    Final    CT Angio Chest Pulmonary Embolism (PE) W or WO Contrast Result Date: 09/02/2024 CLINICAL DATA:  Pulmonary embolism (PE) suspected, high prob. Shortness of breath with exertion. EXAM: CT ANGIOGRAPHY CHEST WITH CONTRAST TECHNIQUE: Multidetector CT imaging of the chest was performed using the standard protocol during bolus administration of intravenous contrast. Multiplanar CT image reconstructions and MIPs were obtained to evaluate the vascular anatomy. RADIATION DOSE REDUCTION: This exam was performed according to the departmental dose-optimization program which includes automated exposure control, adjustment of the mA and/or kV according to patient size and/or use of iterative reconstruction technique. CONTRAST:  75mL OMNIPAQUE  IOHEXOL  350 MG/ML SOLN COMPARISON:  10/31/2008 FINDINGS: Cardiovascular: No filling defects in the pulmonary arteries to suggest pulmonary emboli. Heart borderline in size. Prior CABG. Aortic  atherosclerosis. No aneurysm. Mediastinum/Nodes: No mediastinal, hilar, or axillary adenopathy. Trachea and esophagus are unremarkable. Thyroid  unremarkable. Lungs/Pleura: Ground-glass opacities in interstitial thickening in the lung bases. No effusions. Upper Abdomen: No acute findings Musculoskeletal: Chest wall soft tissues are unremarkable. No acute bony abnormality. Review of the MIP images confirms the above findings. IMPRESSION: No evidence of pulmonary embolus. Dependent and lower lobe ground-glass opacities and interstitial thickening. This could reflect atelectasis or chronic lung disease. Borderline heart size. Aortic Atherosclerosis (ICD10-I70.0). Electronically Signed   By: Franky Crease M.D.   On: 09/02/2024 22:16   DG Chest 2 View Result Date: 09/02/2024 CLINICAL DATA:  sob EXAM: CHEST - 2 VIEW COMPARISON:  June 16, 2019 FINDINGS: The cardiomediastinal silhouette is unchanged in contour.Status post median sternotomy and CABG. Atherosclerotic calcifications. No pleural effusion. No pneumothorax. Mild background of diffuse reticular prominence and mild bronchial markings. Visualized abdomen is unremarkable. Multilevel degenerative changes of the thoracic spine. IMPRESSION: Mild background of diffuse reticular prominence and mild bronchial markings. Findings  could reflect mild pulmonary edema/small airways disease. Electronically Signed   By: Corean Salter M.D.   On: 09/02/2024 13:57     Discharge Exam: Vitals:   09/03/24 0735 09/03/24 1312  BP:  121/62  Pulse:  90  Resp:    Temp:  98.2 F (36.8 C)  SpO2: 99% 99%   Vitals:   09/03/24 0032 09/03/24 0532 09/03/24 0735 09/03/24 1312  BP: 132/80 124/72  121/62  Pulse: 88 90  90  Resp:      Temp: 97.6 F (36.4 C) 97.6 F (36.4 C)  98.2 F (36.8 C)  TempSrc: Oral Oral  Oral  SpO2: 98% 96% 99% 99%  Weight:      Height:        General: Pt is alert, awake, not in acute distress Cardiovascular: RRR, S1/S2 +, no rubs, no  gallops Respiratory: CTA bilaterally, no wheezing, no rhonchi Abdominal: Soft, NT, ND, bowel sounds + Extremities: no edema, no cyanosis    The results of significant diagnostics from this hospitalization (including imaging, microbiology, ancillary and laboratory) are listed below for reference.     Microbiology: Recent Results (from the past 240 hours)  Resp panel by RT-PCR (RSV, Flu A&B, Covid) Anterior Nasal Swab     Status: None   Collection Time: 09/02/24  3:33 PM   Specimen: Anterior Nasal Swab  Result Value Ref Range Status   SARS Coronavirus 2 by RT PCR NEGATIVE NEGATIVE Final    Comment: (NOTE) SARS-CoV-2 target nucleic acids are NOT DETECTED.  The SARS-CoV-2 RNA is generally detectable in upper respiratory specimens during the acute phase of infection. The lowest concentration of SARS-CoV-2 viral copies this assay can detect is 138 copies/mL. A negative result does not preclude SARS-Cov-2 infection and should not be used as the sole basis for treatment or other patient management decisions. A negative result may occur with  improper specimen collection/handling, submission of specimen other than nasopharyngeal swab, presence of viral mutation(s) within the areas targeted by this assay, and inadequate number of viral copies(<138 copies/mL). A negative result must be combined with clinical observations, patient history, and epidemiological information. The expected result is Negative.  Fact Sheet for Patients:  BloggerCourse.com  Fact Sheet for Healthcare Providers:  SeriousBroker.it  This test is no t yet approved or cleared by the [place]  FDA and  has been authorized for detection and/or diagnosis of SARS-CoV-2 by FDA under an Emergency Use Authorization (EUA). This EUA will remain  in effect (meaning this test can be used) for the duration of the COVID-19 declaration under Section 564(b)(1) of the Act,  21 U.S.C.section 360bbb-3(b)(1), unless the authorization is terminated  or revoked sooner.       Influenza A by PCR NEGATIVE NEGATIVE Final   Influenza B by PCR NEGATIVE NEGATIVE Final    Comment: (NOTE) The Xpert Xpress SARS-CoV-2/FLU/RSV plus assay is intended as an aid in the diagnosis of influenza from Nasopharyngeal swab specimens and should not be used as a sole basis for treatment. Nasal washings and aspirates are unacceptable for Xpert Xpress SARS-CoV-2/FLU/RSV testing.  Fact Sheet for Patients: BloggerCourse.com  Fact Sheet for Healthcare Providers: SeriousBroker.it  This test is not yet approved or cleared by the [place]  FDA and has been authorized for detection and/or diagnosis of SARS-CoV-2 by FDA under an Emergency Use Authorization (EUA). This EUA will remain in effect (meaning this test can be used) for the duration of the COVID-19 declaration under Section 564(b)(1) of the Act, 21 U.S.C.  section 360bbb-3(b)(1), unless the authorization is terminated or revoked.     Resp Syncytial Virus by PCR NEGATIVE NEGATIVE Final    Comment: (NOTE) Fact Sheet for Patients: BloggerCourse.com  Fact Sheet for Healthcare Providers: SeriousBroker.it  This test is not yet approved or cleared by the [place]  FDA and has been authorized for detection and/or diagnosis of SARS-CoV-2 by FDA under an Emergency Use Authorization (EUA). This EUA will remain in effect (meaning this test can be used) for the duration of the COVID-19 declaration under Section 564(b)(1) of the Act, 21 U.S.C. section 360bbb-3(b)(1), unless the authorization is terminated or revoked.  Performed at [facility], [address] [place], [place] [address]      Labs: BNP (last 3 results) Recent Labs    09/02/24 1549  BNP 126.0*   Basic Metabolic Panel: Recent Labs  Lab 09/02/24 1317  09/03/24 0512  NA 137 136  K 4.3 3.9  CL 102 103  CO2 23 20*  GLUCOSE 165* 278*  BUN 24* 21  CREATININE 0.84 0.85  CALCIUM  9.1 8.8*   Liver Function Tests: No results for input(s): AST, ALT, ALKPHOS, BILITOT, PROT, ALBUMIN in the last 168 hours. No results for input(s): LIPASE, AMYLASE in the last 168 hours. No results for input(s): AMMONIA in the last 168 hours. CBC: Recent Labs  Lab 09/02/24 1317 09/03/24 0512  WBC 7.5 6.7  HGB 13.1 13.9  HCT 37.1 38.7  MCV 89.4 87.4  PLT 157 127*   Cardiac Enzymes: No results for input(s): CKTOTAL, CKMB, CKMBINDEX, TROPONINI in the last 168 hours. BNP: Invalid input(s): POCBNP CBG: Recent Labs  Lab 09/02/24 2106  GLUCAP 205*   D-Dimer Recent Labs    09/02/24 1549  DDIMER 0.68*   Hgb A1c No results for input(s): HGBA1C in the last 72 hours. Lipid Profile No results for input(s): CHOL, HDL, LDLCALC, TRIG, CHOLHDL, LDLDIRECT in the last 72 hours. Thyroid  function studies No results for input(s): TSH, T4TOTAL, T3FREE, THYROIDAB in the last 72 hours.  Invalid input(s): FREET3 Anemia work up No results for input(s): VITAMINB12, FOLATE, FERRITIN, TIBC, IRON, RETICCTPCT in the last 72 hours. Urinalysis    Component Value Date/Time   COLORURINE YELLOW 10/30/2008 1651   APPEARANCEUR CLEAR 10/30/2008 1651   LABSPEC 1.014 10/30/2008 1651   PHURINE 6.0 10/30/2008 1651   GLUCOSEU NEGATIVE 10/30/2008 1651   HGBUR NEGATIVE 10/30/2008 1651   BILIRUBINUR NEGATIVE 10/30/2008 1651   KETONESUR NEGATIVE 10/30/2008 1651   PROTEINUR NEGATIVE 10/30/2008 1651   UROBILINOGEN 0.2 10/30/2008 1651   NITRITE NEGATIVE 10/30/2008 1651   LEUKOCYTESUR  10/30/2008 1651    NEGATIVE MICROSCOPIC NOT DONE ON URINES WITH NEGATIVE PROTEIN, BLOOD, LEUKOCYTES, NITRITE, OR GLUCOSE <1000 mg/dL.   Sepsis Labs Recent Labs  Lab 09/02/24 1317 09/03/24 0512  WBC 7.5 6.7   Microbiology Recent  Results (from the past 240 hours)  Resp panel by RT-PCR (RSV, Flu A&B, Covid) Anterior Nasal Swab     Status: None   Collection Time: 09/02/24  3:33 PM   Specimen: Anterior Nasal Swab  Result Value Ref Range Status   SARS Coronavirus 2 by RT PCR NEGATIVE NEGATIVE Final    Comment: (NOTE) SARS-CoV-2 target nucleic acids are NOT DETECTED.  The SARS-CoV-2 RNA is generally detectable in upper respiratory specimens during the acute phase of infection. The lowest concentration of SARS-CoV-2 viral copies this assay can detect is 138 copies/mL. A negative result does not preclude SARS-Cov-2 infection and should not be used as the sole  basis for treatment or other patient management decisions. A negative result may occur with  improper specimen collection/handling, submission of specimen other than nasopharyngeal swab, presence of viral mutation(s) within the areas targeted by this assay, and inadequate number of viral copies(<138 copies/mL). A negative result must be combined with clinical observations, patient history, and epidemiological information. The expected result is Negative.  Fact Sheet for Patients:  BloggerCourse.com  Fact Sheet for Healthcare Providers:  SeriousBroker.it  This test is no t yet approved or cleared by the [place]  FDA and  has been authorized for detection and/or diagnosis of SARS-CoV-2 by FDA under an Emergency Use Authorization (EUA). This EUA will remain  in effect (meaning this test can be used) for the duration of the COVID-19 declaration under Section 564(b)(1) of the Act, 21 U.S.C.section 360bbb-3(b)(1), unless the authorization is terminated  or revoked sooner.       Influenza A by PCR NEGATIVE NEGATIVE Final   Influenza B by PCR NEGATIVE NEGATIVE Final    Comment: (NOTE) The Xpert Xpress SARS-CoV-2/FLU/RSV plus assay is intended as an aid in the diagnosis of influenza from Nasopharyngeal swab  specimens and should not be used as a sole basis for treatment. Nasal washings and aspirates are unacceptable for Xpert Xpress SARS-CoV-2/FLU/RSV testing.  Fact Sheet for Patients: BloggerCourse.com  Fact Sheet for Healthcare Providers: SeriousBroker.it  This test is not yet approved or cleared by the [place]  FDA and has been authorized for detection and/or diagnosis of SARS-CoV-2 by FDA under an Emergency Use Authorization (EUA). This EUA will remain in effect (meaning this test can be used) for the duration of the COVID-19 declaration under Section 564(b)(1) of the Act, 21 U.S.C. section 360bbb-3(b)(1), unless the authorization is terminated or revoked.     Resp Syncytial Virus by PCR NEGATIVE NEGATIVE Final    Comment: (NOTE) Fact Sheet for Patients: BloggerCourse.com  Fact Sheet for Healthcare Providers: SeriousBroker.it  This test is not yet approved or cleared by the [place]  FDA and has been authorized for detection and/or diagnosis of SARS-CoV-2 by FDA under an Emergency Use Authorization (EUA). This EUA will remain in effect (meaning this test can be used) for the duration of the COVID-19 declaration under Section 564(b)(1) of the Act, 21 U.S.C. section 360bbb-3(b)(1), unless the authorization is terminated or revoked.  Performed at [facility], [address] [place], [place] [address]      Time coordinating discharge: 35 minutes  SIGNED:   Adron JONETTA Fairly, DO [facility] 09/03/2024, 2:48 PM  If 7PM-7AM, please contact night-coverage [email or web address]

## 2024-09-03 NOTE — Progress Notes (Signed)
 Mobility Specialist Progress Note:    09/03/24 1415  Mobility  Activity Ambulated with assistance  Level of Assistance Independent  Assistive Device None  Distance Ambulated (ft) 130 ft  Range of Motion/Exercises Active;All extremities  Activity Response Tolerated well  Mobility Referral Yes  Mobility visit 1 Mobility  Mobility Specialist Start Time (ACUTE ONLY) 1415  Mobility Specialist Stop Time (ACUTE ONLY) 1440  Mobility Specialist Time Calculation (min) (ACUTE ONLY) 25 min   Pt received sitting EOB, agreeable to mobility. Independently able to stand and ambulate with no AD. Tolerated well,asx throughout. Returned sitting EOB, all needs met.  [place] Mobility Specialist Please contact via Special educational needs teacher or  Rehab office at [phone]

## 2024-09-03 NOTE — TOC Progression Note (Signed)
 Transition of Care [facility]) - Progression Note    Patient Details  Name: [name] Graham MRN: [id number] Date of Birth: [birth year]  Transition of Care [facility] CM/SW Contact  Sharlyne Stabs, RN Phone Number: 09/03/2024, 2:40 PM  Clinical Narrative:   Patient requesting a cardiologist closer to home, due to the commute. CM call [place] office they schedule her with E.Peck NP, Oct 17th at 8:30am. Appointment added to AVS, explained and updated the patient and MD.   Expected Discharge Plan and Services      Expected Discharge Date: 09/03/24                    Social Drivers of Health (SDOH) Interventions SDOH Screenings   Food Insecurity: No Food Insecurity (09/02/2024)  Housing: Low Risk  (09/02/2024)  Transportation Needs: No Transportation Needs (09/02/2024)  Utilities: Not At Risk (09/02/2024)  Social Connections: Socially Isolated (09/02/2024)  Tobacco Use: Low Risk  (09/02/2024)

## 2024-09-03 NOTE — Progress Notes (Signed)
*  PRELIMINARY RESULTS* Echocardiogram 2D Echocardiogram has been performed.  [name] Graham 09/03/2024, 1:30 PM

## 2024-09-03 NOTE — Plan of Care (Signed)
   Problem: Education: Goal: Knowledge of General Education information will improve Description: Including pain rating scale, medication(s)/side effects and non-pharmacologic comfort measures Outcome: Progressing   Problem: Clinical Measurements: Goal: Ability to maintain clinical measurements within normal limits will improve Outcome: Progressing Goal: Diagnostic test results will improve Outcome: Progressing

## 2024-09-03 NOTE — Progress Notes (Signed)
   09/03/24 1241  TOC Brief Assessment  Insurance and Status Reviewed  Patient has primary care physician Yes  Home environment has been reviewed Home alone  Prior level of function: Independent  Prior/Current Home Services No current home services  Social Drivers of Health Review SDOH reviewed no interventions necessary  Readmission risk has been reviewed Yes  Transition of care needs no transition of care needs at this time   Echo at the bedside to start procedure. Patient lives home alone, still works substitute teaches at the high school. Drives herself and care for herself, she has a grand daughter and three sisters to assist if needed. No equipment needed.

## 2024-09-03 NOTE — Progress Notes (Signed)
 Mobility Specialist Progress Note:    09/03/24 1110  Mobility  Activity Ambulated with assistance  Level of Assistance Modified independent, requires aide device or extra time  Assistive Device None  Distance Ambulated (ft) 40 ft  Range of Motion/Exercises Active;All extremities  Activity Response Tolerated well  Mobility Referral Yes  Mobility visit 1 Mobility  Mobility Specialist Start Time (ACUTE ONLY) 1110  Mobility Specialist Stop Time (ACUTE ONLY) 1130  Mobility Specialist Time Calculation (min) (ACUTE ONLY) 20 min   Pt received in bed, agreeable to mobility. ModI to stand and ambulate with no AD. Tolerated well, c/o SOB near EOS. HR max during session was 93 bpm, left sitting EOB. Family in room,all needs met.  Ronique Simerly Mobility Specialist Please contact via Special educational needs teacher or  Rehab office at [phone]

## 2024-09-03 NOTE — Care Management Obs Status (Signed)
 MEDICARE OBSERVATION STATUS NOTIFICATION   Patient Details  Name: [name] Graham MRN: [id number] Date of Birth: Jun 02, 1934   Medicare Observation Status Notification Given:  Yes    Sharlyne Stabs, RN 09/03/2024, 12:38 PM

## 2024-09-07 DIAGNOSIS — H353134 Nonexudative age-related macular degeneration, bilateral, advanced atrophic with subfoveal involvement: Secondary | ICD-10-CM | POA: Diagnosis not present

## 2024-09-21 ENCOUNTER — Encounter: Payer: Self-pay | Admitting: Nurse Practitioner

## 2024-09-21 ENCOUNTER — Ambulatory Visit: Attending: Nurse Practitioner | Admitting: Nurse Practitioner

## 2024-09-21 NOTE — Progress Notes (Deleted)
  Cardiology Office Note   Date:  09/21/2024  ID:  [name] Graham, DOB 04-19-1934, MRN [id number] PCP: [name] Leta NOVAK, MD  [facility] Providers Cardiologist:  Ozell Fell, MD { Click to update primary MD,subspecialty MD or APP then REFRESH:1}    History of Present Illness [name] Graham is a 89 y.o. female with a PMH of chest pain, CAD, s/p CABG in 2009, shortness of breath, thrombocytopenia, pulmonary nodule, anemia, who presents today for hospital follow-up.  Last seen by Dr. Fell on November 14, 2023.  She was doing well at the time, did note some occasional chest tightness and discomfort with activity, resolve quickly with rest, this was noted to be chronic and stable over the past year.  Stated she did not have to take any sublingual nitroglycerin .  Was still very active and independent at the time.  Recently hospitalized at the end of September 2025, initially presented with dyspnea on exertion and had associated orthopnea, chest pressure, PND and lower extremity edema.  She was admitted with acute bronchitis and did not appear to have any signs of volume overload, echocardiogram revealed signs of diastolic CHF, grade 1.  She is here for hospital follow-up.  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  ROS: ***  Studies Reviewed      *** Risk Assessment/Calculations {Does this patient have ATRIAL FIBRILLATION?[phone]} No BP recorded.  {Refresh Note OR Click here to enter BP  :1}***       Physical Exam VS:  There were no vitals taken for this visit.       Wt Readings from Last 3 Encounters:  09/02/24 128 lb 1.6 oz (58.1 kg)  11/14/23 129 lb (58.5 kg)  12/23/22 127 lb (57.6 kg)    GEN: Well nourished, well developed in no acute distress NECK: No JVD; No carotid bruits CARDIAC: ***RRR, no murmurs, rubs, gallops RESPIRATORY:  Clear to auscultation without rales, wheezing or rhonchi  ABDOMEN: Soft, non-tender, non-distended EXTREMITIES:  No  edema; No deformity   ASSESSMENT AND PLAN ***    {Are you ordering a CV Procedure (e.g. stress test, cath, DCCV, TEE, etc)?   Press F2        :[id number]}  Dispo: ***  Signed, Almarie Crate, NP
